# Patient Record
Sex: Female | Born: 1975 | Race: White | Hispanic: No | Marital: Married | Smoking: Never smoker
Health system: Southern US, Community
[De-identification: ages and names within clinical notes are randomized; demographics above are authoritative.]

## PROBLEM LIST (undated history)

## (undated) DIAGNOSIS — E119 Type 2 diabetes mellitus without complications: Secondary | ICD-10-CM

## (undated) DIAGNOSIS — E041 Nontoxic single thyroid nodule: Secondary | ICD-10-CM

## (undated) DIAGNOSIS — F32A Depression, unspecified: Secondary | ICD-10-CM

## (undated) DIAGNOSIS — F419 Anxiety disorder, unspecified: Secondary | ICD-10-CM

## (undated) DIAGNOSIS — C50919 Malignant neoplasm of unspecified site of unspecified female breast: Secondary | ICD-10-CM

## (undated) HISTORY — DX: Type 2 diabetes mellitus without complications: E11.9

## (undated) HISTORY — DX: Malignant neoplasm of unspecified site of unspecified female breast: C50.919

## (undated) HISTORY — PX: TONSILLECTOMY: SUR1361

---

## 2019-08-17 ENCOUNTER — Other Ambulatory Visit (HOSPITAL_COMMUNITY): Payer: Self-pay | Admitting: Family Medicine

## 2019-08-17 ENCOUNTER — Other Ambulatory Visit: Payer: Self-pay

## 2019-08-17 ENCOUNTER — Ambulatory Visit (HOSPITAL_COMMUNITY)
Admission: RE | Admit: 2019-08-17 | Discharge: 2019-08-17 | Disposition: A | Discharge status: Home / Self Care | Payer: Managed Care, Other (non HMO) | Source: Ambulatory Visit | Attending: Family Medicine | Admitting: Family Medicine

## 2019-08-17 DIAGNOSIS — J209 Acute bronchitis, unspecified: Secondary | ICD-10-CM

## 2019-11-27 ENCOUNTER — Other Ambulatory Visit: Payer: Self-pay | Admitting: Unknown Physician Specialty

## 2019-11-27 DIAGNOSIS — R928 Other abnormal and inconclusive findings on diagnostic imaging of breast: Secondary | ICD-10-CM

## 2019-12-14 ENCOUNTER — Other Ambulatory Visit: Payer: Self-pay | Admitting: Unknown Physician Specialty

## 2019-12-14 ENCOUNTER — Ambulatory Visit
Admission: RE | Admit: 2019-12-14 | Discharge: 2019-12-14 | Disposition: A | Discharge status: Home / Self Care | Payer: Managed Care, Other (non HMO) | Source: Ambulatory Visit | Attending: Unknown Physician Specialty | Admitting: Unknown Physician Specialty

## 2019-12-14 ENCOUNTER — Other Ambulatory Visit: Payer: Self-pay

## 2019-12-14 DIAGNOSIS — R921 Mammographic calcification found on diagnostic imaging of breast: Secondary | ICD-10-CM

## 2019-12-14 DIAGNOSIS — R928 Other abnormal and inconclusive findings on diagnostic imaging of breast: Secondary | ICD-10-CM

## 2019-12-28 ENCOUNTER — Ambulatory Visit
Admission: RE | Admit: 2019-12-28 | Discharge: 2019-12-28 | Disposition: A | Discharge status: Home / Self Care | Payer: Managed Care, Other (non HMO) | Source: Ambulatory Visit | Attending: Unknown Physician Specialty | Admitting: Unknown Physician Specialty

## 2019-12-28 ENCOUNTER — Other Ambulatory Visit: Payer: Self-pay | Admitting: Unknown Physician Specialty

## 2019-12-28 ENCOUNTER — Other Ambulatory Visit: Payer: Self-pay

## 2019-12-28 DIAGNOSIS — R921 Mammographic calcification found on diagnostic imaging of breast: Secondary | ICD-10-CM

## 2019-12-28 HISTORY — PX: BREAST BIOPSY: SHX20

## 2020-01-02 ENCOUNTER — Encounter: Payer: Self-pay | Admitting: *Deleted

## 2020-01-02 DIAGNOSIS — D0511 Intraductal carcinoma in situ of right breast: Secondary | ICD-10-CM | POA: Insufficient documentation

## 2020-01-02 NOTE — Progress Notes (Signed)
[place]   Telephone:([phone]) [phone] Fax:([phone]) [place] Note   Patient Care Team: Scherrie Bateman as PCP - General (Family Medicine) Mauro Kaufmann, RN as Oncology Nurse Navigator Rockwell Germany, RN as Oncology Nurse Navigator Truitt Merle, MD as Consulting Physician (Hematology)  Date of Service:  01/03/2020   CHIEF COMPLAINTS/PURPOSE OF CONSULTATION:  Newly Diagnosed right breast DCIS   REFERRING PHYSICIAN:  [place]   Oncology History Overview Note  Cancer Staging Ductal carcinoma in situ (DCIS) of right breast Staging form: Breast, AJCC 8th Edition - Clinical stage from 12/28/2019: Stage 0 (cTis (Pag[place]), cN0, cM0, GX, ER+, PR+, HER2: Not Assessed) - Signed by Truitt Merle, MD on 01/02/2020    Ductal carcinoma in situ (DCIS) of right breast  12/14/2019 Mammogram   IMPRESSION: Suspicious calcifications in the OUTER RETROAREOLAR RIGHT breast spanning a distance of 3.5 cm. Tissue sampling is recommended.   12/28/2019 Cancer Staging   Staging form: Breast, AJCC 8th Edition - Clinical stage from 12/28/2019: Stage 0 (cTis (Paget), cN0, cM0, GX, ER+, PR+, HER2: Not Assessed) - Signed by Truitt Merle, MD on 01/02/2020   12/28/2019 Initial Biopsy   Diagnosis 1. Breast, right, needle core biopsy, lateral, anterior - DUCTAL CARCINOMA IN SITU, HIGH-GRADE WITH FOCAL NECROSIS AND CALCIFICATIONS. SEE NOTE 2. Breast, right, needle core biopsy, 12 o'clock, retroareolar - DUCTAL CARCINOMA IN SITU, HIGH-GRADE WITH FOCAL NECROSIS AND CALCIFICATIONS. SEE NOTE   Diagnosis Note 1. and 2. DCIS measures 0.4 cm in part 1 and 0.2 cm in part 2 in greatest linear dimension. Dr. Tresa Moore reviewed the case and concurs with the diagnosis. A breast prognostic profile (ER, PR) is pending and will be reported in an addendum. [facility] was notified on 01/01/2020.   12/28/2019 Receptors her2   1. PROGNOSTIC  INDICATORS Results: IMMUNOHISTOCHEMICAL AND MORPHOMETRIC ANALYSIS PERFORMED MANUALLY Estrogen Receptor: 95%, POSITIVE, STRONG STAINING INTENSITY Progesterone Receptor: 70%, POSITIVE, STRONG STAINING INTENSITY   01/02/2020 Initial Diagnosis   Ductal carcinoma in situ (DCIS) of right breast      HISTORY OF PRESENTING ILLNESS:  Natalie Cardenas 44 y.o. female is a here because of newly diagnosed right breast DCIS. The patient was referred by breast center. The patient presents to the clinic today by herself.   She underwent her first screening mammogram on 11/06/2019 in [place].  She has no palpable breast lesion, denies any skin change or nipple discharge, no breast or constitutional symptoms.  Her mammogram showed indeterminate calcification in the right breast, so she was referred to [facility] breast center for diagnostic mammogram on December 14, 2019, which showed suspicious calcifications in the outer retroareolar area of right breast, measuring 3.5 cm.  Biopsy was recommended and done on December 28, 2019, which showed high-grade DCIS with necrosis and calcification, ER and PR positive.  Patient was referred to [place] to discuss treatment options.  She is a dental hygienist,married.  She previously had a multiple fertility treatments in her 37s and 5 years ago.  She has 2 biological children which are all adults.  Her menstrual period has been normal and due 2 months ago, which was her last one.  She denies any hot flash or mood swing lately.  She has no significant past medical history, except mild anxiety.  She had episode of shingles infection on her left forehead, a few days after her second Covid vaccine shot in February 2021.  Review of system is negative, except  mild shortness of breath intermittently, no significant relationship with exertion. She denies any chest pain, cough or other symptoms.   GYN HISTORY  Menarchal: 10 LMP: 10/18/2019, was regular before that  Contraceptive: none  HRT: no   G2P2:    MEDICAL HISTORY:  Past Medical History:  Diagnosis Date  . Breast cancer ([place])   . Diabetes mellitus without complication ([place])     SURGICAL HISTORY: History reviewed. No pertinent surgical history.  SOCIAL HISTORY: Social History   Socioeconomic History  . Marital status: Married    Spouse name: Not on file  . Number of children: 4  . Years of education: Not on file  . Highest education level: Not on file  Occupational History  . Not on file  Tobacco Use  . Smoking status: Never Smoker  . Smokeless tobacco: Never Used  Vaping Use  . Vaping Use: Never used  Substance and Sexual Activity  . Alcohol use: Not on file    Comment: social   . Drug use: Never  . Sexual activity: Yes  Other Topics Concern  . Not on file  Social History Narrative  . Not on file   Social Determinants of Health   Financial Resource Strain:   . Difficulty of Paying Living Expenses: Not on file  Food Insecurity:   . Worried About Charity fundraiser in the Last Year: Not on file  . Ran Out of Food in the Last Year: Not on file  Transportation Needs:   . Lack of Transportation (Medical): Not on file  . Lack of Transportation (Non-Medical): Not on file  Physical Activity:   . Days of Exercise per Week: Not on file  . Minutes of Exercise per Session: Not on file  Stress:   . Feeling of Stress : Not on file  Social Connections:   . Frequency of Communication with Friends and Family: Not on file  . Frequency of Social Gatherings with Friends and Family: Not on file  . Attends Religious Services: Not on file  . Active Member of Clubs or Organizations: Not on file  . Attends Archivist Meetings: Not on file  . Marital Status: Not on file  Intimate Partner Violence:   . Fear of Current or Ex-Partner: Not on file  . Emotionally Abused: Not on file  . Physically Abused: Not on file  . Sexually Abused: Not on file    FAMILY HISTORY: Family History  Problem Relation  Age of Onset  . Cancer Father 34       esophageal cancer  . Cancer Paternal Grandfather        esophageal cancer     ALLERGIES:  has No Known Allergies.  MEDICATIONS:  Current Outpatient Medications  Medication Sig Dispense Refill  . diphenhydrAMINE (BENADRYL) 25 MG tablet Take 25 mg by mouth at bedtime as needed.    . Probiotic Product (PROBIOTIC DAILY PO) Take by mouth.    . sennosides-docusate sodium (SENOKOT-S) 8.6-50 MG tablet Take 3 tablets by mouth at bedtime.    Marland Kitchen UNABLE TO FIND daily. Med Name: vitamin b 3    . acarbose (PRECOSE) 25 MG tablet Take by mouth daily.    Marland Kitchen albuterol (VENTOLIN HFA) 108 (90 Base) MCG/ACT inhaler SMARTSIG:1-2 Puff(s) By Mouth Every 4 Hours PRN    . ASMANEX HFA 200 MCG/ACT AERO SMARTSIG:1 Puff(s) Via Inhaler Morning-Evening    . clonazePAM (KLONOPIN) 0.5 MG tablet Take 0.5 mg by mouth 2 (two) times daily as needed.    Marland Kitchen  Prenatal Vit-Fe Fumarate-FA (PRENATAL VITAMIN PO) Take by mouth.     No current facility-administered medications for this visit.    PHYSICAL EXAMINATION: ECOG PERFORMANCE STATUS: 0 - Asymptomatic  Vitals:   01/03/20 1321  BP: 128/90  Pulse: 76  Resp: 18  Temp: (!) 97.3 F (36.3 C)  SpO2: 100%   Filed Weights   01/03/20 1321  Weight: 139 lb 1.6 oz (63.1 kg)    GENERAL:alert, no distress and comfortable SKIN: skin color, texture, turgor are normal, no rashes or significant lesions EYES: normal, Conjunctiva are pink and non-injected, sclera clear NECK: supple, thyroid normal size, non-tender, without nodularity LYMPH:  no palpable lymphadenopathy in the cervical, axillary  LUNGS: clear to auscultation and percussion with normal breathing effort HEART: regular rate & rhythm and no murmurs and no lower extremity edema ABDOMEN:abdomen soft, non-tender and normal bowel sounds Musculoskeletal:no cyanosis of digits and no clubbing  NEURO: alert & oriented x 3 with fluent speech, no focal motor/sensory deficits Breasts:  Breast inspection showed them to be symmetrical with no nipple discharge. (+) Moderate ecchymosis over the biopsy site in the outer upper quadrant of right breast. palpation of the breasts and axilla revealed no obvious mass that I could appreciate except a small mass at 9:00 biopsy site which is likely a hematoma.   LABORATORY DATA:  I have reviewed the data as listed No flowsheet data found.  No flowsheet data found.   RADIOGRAPHIC STUDIES: I have personally reviewed the radiological images as listed and agreed with the findings in the report. MM Digital Diagnostic Unilat R  Result Date: 12/14/2019 CLINICAL DATA:  44 year old female for further evaluation of RIGHT breast calcifications identified on baseline screening mammogram. EXAM: DIGITAL DIAGNOSTIC RIGHT MAMMOGRAM WITH CAD COMPARISON:  Previous exam(s). ACR Breast Density Category c: The breast tissue is heterogeneously dense, which may obscure small masses. FINDINGS: Full field and magnification views of the RIGHT breast demonstrate pleomorphic calcifications within the OUTER RETROAREOLAR RIGHT breast spanning a distance of up to 3.5 cm in the transverse dimension. No associated mass identified. Mammographic images were processed with CAD. IMPRESSION: Suspicious calcifications in the OUTER RETROAREOLAR RIGHT breast spanning a distance of 3.5 cm. Tissue sampling is recommended. RECOMMENDATION: 3D/stereotactic guided RIGHT breast biopsies of RIGHT breast calcifications. Ideally, biopsy of the two farthest extent of calcifications is recommended, but may not be possible due to the superficial position of these calcifications. If these calcifications are positive for malignancy and breast conservation is desired, then MRI of the breast would be recommended. I have discussed the findings and recommendations with the patient. If applicable, a reminder letter will be sent to the patient regarding the next appointment. BI-RADS CATEGORY  4: Suspicious.  Electronically Signed   By: Margarette Canada M.D.   On: 12/14/2019 12:19   MM CLIP PLACEMENT RIGHT  Result Date: 12/28/2019 CLINICAL DATA:  Evaluate post biopsy marker clip placement following stereotactic core needle biopsy of a span of calcifications in the anterior right breast. Two biopsies performed. EXAM: DIAGNOSTIC RIGHT MAMMOGRAM POST STEREOTACTIC BIOPSY COMPARISON:  Previous exam(s). FINDINGS: Mammographic images were obtained following stereotactic guided biopsy of right breast calcifications. The coil shaped biopsy clip lies adjacent to residual calcifications along the anterolateral breast and the X shaped biopsy clip lies in the retroareolar breast in the expected location of the calcifications along this portion of the span of suspicious calcifications. The biopsy clips are separated by 1.8 cm on the LM view and 1 cm on the cc view.  IMPRESSION: Appropriate positioning of the coil and X shaped biopsy marking clips at the site of biopsy in the anterior right breast. Final Assessment: Post Procedure Mammograms for Marker Placement Electronically Signed   By: Lajean Manes M.D.   On: 12/28/2019 12:23   MM RT BREAST BX W LOC DEV 1ST LESION IMAGE BX SPEC STEREO GUIDE  Addendum Date: 01/03/2020   ADDENDUM REPORT: 01/03/2020 09:14 ADDENDUM: Pathology revealed HIGH-GRADE DUCTAL CARCINOMA IN SITU WITH FOCAL NECROSIS AND CALCIFICATIONS of the Right breast, both locations, lateral anterior and retroareolar. This was found to be concordant by Dr. Lajean Manes. Pathology results were discussed with the patient by telephone. The patient reported doing well after the biopsies with tenderness and bleeding at the sites. Post biopsy instructions and care were reviewed and questions were answered. The patient was encouraged to call [facility] for any additional concerns. My direct phone number was provided. Surgical consultation has been arranged with Dr. Rolm Bookbinder, per patient  request, at [facility] on January 11, 2020. Medical Oncology consultation has been arranged with Dr. Truitt Merle at [facility] on January 03, 2020. Consideration for a bilateral breast MRI for further evaluation of extent of disease given the High Grade histology. Pathology results were called and faxed to Dr. Floreen Comber Buist's office on January 01, 2020, ([place]). Pathology results reported by Terie Purser, RN on 01/03/2020. Electronically Signed   By: Lajean Manes M.D.   On: 01/03/2020 09:14   Result Date: 01/03/2020 CLINICAL DATA:  Patient presents for stereotactic core needle biopsy of a 3.7 cm span of calcifications in the right breast, extending from the lateral anterior aspect of the breast to the retroareolar breast. EXAM: RIGHT BREAST STEREOTACTIC CORE NEEDLE BIOPSY: 2 BIOPSIES PERFORMED COMPARISON:  Previous exams. FINDINGS: The patient and I discussed the procedure of stereotactic-guided biopsy including benefits and alternatives. We discussed the high likelihood of a successful procedure. We discussed the risks of the procedure including infection, bleeding, tissue injury, clip migration, and inadequate sampling. Informed written consent was given. The usual time out protocol was performed immediately prior to the procedure. Lesion #1: Calcifications along the anterior, lateral aspect of the breast. Using sterile technique and 1% Lidocaine as local anesthetic, under stereotactic guidance, a 9 gauge vacuum assisted device was used to perform core needle biopsy of calcifications in the anterior, lateral aspect of the right breast using a lateral approach. Specimen radiograph was performed showing multiple calcifications for which biopsy was performed. Specimens with calcifications are identified for pathology. Lesion quadrant: Lower outer quadrant, near 9 o'clock. At the conclusion of the procedure, coil shaped tissue marker clip was deployed into the biopsy  cavity. Lesion #2: 12 o'clock, retroareolar extent of the calcifications. Using sterile technique and 1% Lidocaine as local anesthetic, under stereotactic guidance, a 9 gauge vacuum assisted device was used to perform core needle biopsy of calcifications in the superior retroareolar right breast using a lateral approach. Specimen radiograph was performed showing calcifications for which biopsy was performed. Specimens with calcifications are identified for pathology. Lesion quadrant: Upper outer quadrant, near 12 o'clock, retroareolar. At the conclusion of the procedure, X shaped tissue marker clip was deployed into the biopsy cavity. Follow-up 2-view mammogram was performed and dictated separately. IMPRESSION: Stereotactic-guided biopsy of 2 areas along a 3.7 cm span of right breast calcifications. No apparent complications. Electronically Signed: By: Lajean Manes M.D. On: 12/28/2019 12:18   MM RT BREAST BX  W LOC DEV EA AD LESION IMG BX SPEC STEREO GUIDE  Addendum Date: 01/03/2020   ADDENDUM REPORT: 01/03/2020 09:14 ADDENDUM: Pathology revealed HIGH-GRADE DUCTAL CARCINOMA IN SITU WITH FOCAL NECROSIS AND CALCIFICATIONS of the Right breast, both locations, lateral anterior and retroareolar. This was found to be concordant by Dr. Lajean Manes. Pathology results were discussed with the patient by telephone. The patient reported doing well after the biopsies with tenderness and bleeding at the sites. Post biopsy instructions and care were reviewed and questions were answered. The patient was encouraged to call [facility] for any additional concerns. My direct phone number was provided. Surgical consultation has been arranged with Dr. Rolm Bookbinder, per patient request, at [facility] on January 11, 2020. Medical Oncology consultation has been arranged with Dr. Truitt Merle at [facility] on January 03, 2020. Consideration for a bilateral breast MRI for  further evaluation of extent of disease given the High Grade histology. Pathology results were called and faxed to Dr. Floreen Comber Buist's office on January 01, 2020, ([place]). Pathology results reported by Terie Purser, RN on 01/03/2020. Electronically Signed   By: Lajean Manes M.D.   On: 01/03/2020 09:14   Result Date: 01/03/2020 CLINICAL DATA:  Patient presents for stereotactic core needle biopsy of a 3.7 cm span of calcifications in the right breast, extending from the lateral anterior aspect of the breast to the retroareolar breast. EXAM: RIGHT BREAST STEREOTACTIC CORE NEEDLE BIOPSY: 2 BIOPSIES PERFORMED COMPARISON:  Previous exams. FINDINGS: The patient and I discussed the procedure of stereotactic-guided biopsy including benefits and alternatives. We discussed the high likelihood of a successful procedure. We discussed the risks of the procedure including infection, bleeding, tissue injury, clip migration, and inadequate sampling. Informed written consent was given. The usual time out protocol was performed immediately prior to the procedure. Lesion #1: Calcifications along the anterior, lateral aspect of the breast. Using sterile technique and 1% Lidocaine as local anesthetic, under stereotactic guidance, a 9 gauge vacuum assisted device was used to perform core needle biopsy of calcifications in the anterior, lateral aspect of the right breast using a lateral approach. Specimen radiograph was performed showing multiple calcifications for which biopsy was performed. Specimens with calcifications are identified for pathology. Lesion quadrant: Lower outer quadrant, near 9 o'clock. At the conclusion of the procedure, coil shaped tissue marker clip was deployed into the biopsy cavity. Lesion #2: 12 o'clock, retroareolar extent of the calcifications. Using sterile technique and 1% Lidocaine as local anesthetic, under stereotactic guidance, a 9 gauge vacuum assisted device was used to perform core  needle biopsy of calcifications in the superior retroareolar right breast using a lateral approach. Specimen radiograph was performed showing calcifications for which biopsy was performed. Specimens with calcifications are identified for pathology. Lesion quadrant: Upper outer quadrant, near 12 o'clock, retroareolar. At the conclusion of the procedure, X shaped tissue marker clip was deployed into the biopsy cavity. Follow-up 2-view mammogram was performed and dictated separately. IMPRESSION: Stereotactic-guided biopsy of 2 areas along a 3.7 cm span of right breast calcifications. No apparent complications. Electronically Signed: By: Lajean Manes M.D. On: 12/28/2019 12:18    ASSESSMENT & PLAN:  Natalie Cardenas is a 44 y.o. female with a history of anxiety and shingle infection   1. Right breast DCIS, high grade, ER+PR+  -I discussed her breast imaging and needle biopsy results with patient in great detail. She was found to have high grade DCIS in  her right breast with necrosis and calcifications on screening mammogram.  -She is probably a candidate for breast conservation surgery, although the area of involvement is not very small and I am not sure if she needs mastectomy. She is scheduled to see breast surgeon Dr. Donne Hazel next week. Given her young age and dense breast tissue, will also obtain breast MRI before proceeding with Surgery.  -Her DCIS will be cured by complete surgical resection. Any form of adjuvant therapy is preventive. -I reviewed her risk and treatment benefits using the Breast Cancer Nomogram from [facility]). Based on family, PMx and lifestyle she has a 18% risk of developing future breast cancer in the next 10 years. Her risk would drop to 7-9% with RT or Antiestrogen therapy alone. With both adjuvant treatments her risk would decrease to 3%. She is interested in both preventative treatments.  -She will likely benefit from breast radiation if she undergo  lumpectomy to decrease the risk of local breast cancer. She will be referred to rad/onc if lumpectomy -Given her strongly positive ER and PR and premenopausal status, I do recommend antiestrogen therapy with Tamoxifen for 5 years, which decrease her risk of future breast cancer by ~50%.              -The potential side effects, which includes but not limited to, hot flash, skin and vaginal dryness, slightly increased risk of cardiovascular disease and cataract, small risk of thrombosis and endometrial cancer, were discussed with her in great details. Preventive strategies for thrombosis, such as being physically active, using compression stocks, avoid cigarette smoking, etc., were reviewed with her. I also recommend her to follow-up with her gynecologist once a year, and watch for vaginal spotting or bleeding, as a clinically sign of endometrial cancer, etc. She voiced good understanding, and agrees to proceed. Will start after she completes adjuvant breast radiation.  -We also discussed that biopsy may have sampling limitation, we will review her surgical path, to see if she has any invasive carcinoma components. -We also discussed the breast cancer surveillance after her surgery. She will continue annual screening mammogram, self exams, and a routine office visit with lab and exam with [place]. I discussed the option of additional screening with annual breast MRIs. I also discussed Abbreviated MRIs which have $400 out-of-pocket cost if insurance does not cover standard breast MRI. She is interested. -Labs reviewed, CBC and CMP WNL except BG 110, Cr 1.11. I encouraged her to drink more water. Initial exam benign.  -Given her young age, I will also refer her to genetics.  Depending on the results, prophylactic bilateral mastectomy may be considered.  -F/u after surgery or radiation   Plan -Bilateral breast MRI with and without contrast -urgent genetic referral -She will see Dr. Donne Hazel next week -I will  see her after surgery or radiation. -She is interested in chemoprevention with tamoxifen  Orders Placed This Encounter  Procedures  . MR BREAST BILATERAL W WO CONTRAST INC CAD    Standing Status:   Future    Standing Expiration Date:   01/02/2021    Order Specific Question:   If indicated for the ordered procedure, I authorize the administration of contrast media per Radiology protocol    Answer:   Yes    Order Specific Question:   What is the patient's sedation requirement?    Answer:   Anti-anxiety    Order Specific Question:   Does the patient have a pacemaker or implanted devices?  Answer:   No    Order Specific Question:   Radiology Contrast Protocol - do NOT remove file path    Answer:   [email or web address]    Order Specific Question:   Preferred imaging location?    Answer:   [facility] (table limit-550lbs)  . Ambulatory referral to Genetics    Referral Priority:   Urgent    Referral Type:   Consultation    Referral Reason:   Specialty Services Required    Number of Visits Requested:   1    All questions were answered. The patient knows to call the clinic with any problems, questions or concerns. The total time spent in the appointment was 50 minutes.     Truitt Merle, MD 01/03/2020  I, Joslyn Devon, am acting as scribe for Truitt Merle, MD.   I have reviewed the above documentation for accuracy and completeness, and I agree with the above.

## 2020-01-03 ENCOUNTER — Inpatient Hospital Stay: Payer: Managed Care, Other (non HMO) | Attending: Hematology | Admitting: Hematology

## 2020-01-03 ENCOUNTER — Encounter: Payer: Self-pay | Admitting: Hematology

## 2020-01-03 ENCOUNTER — Encounter: Payer: Self-pay | Admitting: *Deleted

## 2020-01-03 ENCOUNTER — Telehealth: Payer: Self-pay | Admitting: Hematology

## 2020-01-03 ENCOUNTER — Telehealth: Payer: Self-pay | Admitting: *Deleted

## 2020-01-03 ENCOUNTER — Other Ambulatory Visit: Payer: Self-pay

## 2020-01-03 DIAGNOSIS — R0602 Shortness of breath: Secondary | ICD-10-CM | POA: Diagnosis not present

## 2020-01-03 DIAGNOSIS — F419 Anxiety disorder, unspecified: Secondary | ICD-10-CM | POA: Insufficient documentation

## 2020-01-03 DIAGNOSIS — E119 Type 2 diabetes mellitus without complications: Secondary | ICD-10-CM | POA: Diagnosis not present

## 2020-01-03 DIAGNOSIS — Z170 Estrogen receptor positive status [ER+]: Secondary | ICD-10-CM | POA: Insufficient documentation

## 2020-01-03 DIAGNOSIS — Z79899 Other long term (current) drug therapy: Secondary | ICD-10-CM | POA: Insufficient documentation

## 2020-01-03 DIAGNOSIS — D0511 Intraductal carcinoma in situ of right breast: Secondary | ICD-10-CM | POA: Diagnosis not present

## 2020-01-03 NOTE — Telephone Encounter (Signed)
Scheduled appt per 9/9 sch msg - left message for patient with appt date and time

## 2020-01-03 NOTE — Telephone Encounter (Signed)
Called pt to provide navigation resources and contact information. Left vm to return call.

## 2020-01-04 ENCOUNTER — Encounter: Payer: Self-pay | Admitting: *Deleted

## 2020-01-04 ENCOUNTER — Telehealth: Payer: Self-pay | Admitting: Hematology

## 2020-01-04 ENCOUNTER — Telehealth: Payer: Self-pay | Admitting: *Deleted

## 2020-01-04 NOTE — Telephone Encounter (Signed)
Spoke to pt concerning treatment care plan and diagnosis. Provided navigation resources.  Confirmed future appts for MRI, genetics and appt with Dr. Donne Hazel

## 2020-01-04 NOTE — Telephone Encounter (Signed)
No 9/9 los

## 2020-01-07 ENCOUNTER — Other Ambulatory Visit: Payer: Managed Care, Other (non HMO)

## 2020-01-07 ENCOUNTER — Inpatient Hospital Stay: Payer: Managed Care, Other (non HMO)

## 2020-01-07 ENCOUNTER — Other Ambulatory Visit: Payer: Self-pay | Admitting: Hematology

## 2020-01-07 ENCOUNTER — Other Ambulatory Visit: Payer: Self-pay | Admitting: Genetic Counselor

## 2020-01-07 ENCOUNTER — Other Ambulatory Visit: Payer: Self-pay

## 2020-01-07 ENCOUNTER — Encounter: Payer: Managed Care, Other (non HMO) | Admitting: Genetic Counselor

## 2020-01-07 ENCOUNTER — Telehealth: Payer: Self-pay | Admitting: *Deleted

## 2020-01-07 ENCOUNTER — Inpatient Hospital Stay (HOSPITAL_BASED_OUTPATIENT_CLINIC_OR_DEPARTMENT_OTHER): Payer: Managed Care, Other (non HMO) | Admitting: Genetic Counselor

## 2020-01-07 DIAGNOSIS — D0511 Intraductal carcinoma in situ of right breast: Secondary | ICD-10-CM

## 2020-01-07 DIAGNOSIS — Z1379 Encounter for other screening for genetic and chromosomal anomalies: Secondary | ICD-10-CM | POA: Diagnosis not present

## 2020-01-07 DIAGNOSIS — R5383 Other fatigue: Secondary | ICD-10-CM

## 2020-01-07 LAB — CBC WITH DIFFERENTIAL (CANCER CENTER ONLY)
Abs Immature Granulocytes: 0.02 10*3/uL (ref 0.00–0.07)
Basophils Absolute: 0 10*3/uL (ref 0.0–0.1)
Basophils Relative: 0 %
Eosinophils Absolute: 0.2 10*3/uL (ref 0.0–0.5)
Eosinophils Relative: 3 %
HCT: 34.3 % — ABNORMAL LOW (ref 36.0–46.0)
Hemoglobin: 11.1 g/dL — ABNORMAL LOW (ref 12.0–15.0)
Immature Granulocytes: 0 %
Lymphocytes Relative: 26 %
Lymphs Abs: 1.9 10*3/uL (ref 0.7–4.0)
MCH: 31.4 pg (ref 26.0–34.0)
MCHC: 32.4 g/dL (ref 30.0–36.0)
MCV: 96.9 fL (ref 80.0–100.0)
Monocytes Absolute: 0.6 10*3/uL (ref 0.1–1.0)
Monocytes Relative: 8 %
Neutro Abs: 4.4 10*3/uL (ref 1.7–7.7)
Neutrophils Relative %: 63 %
Platelet Count: 222 10*3/uL (ref 150–400)
RBC: 3.54 MIL/uL — ABNORMAL LOW (ref 3.87–5.11)
RDW: 12.7 % (ref 11.5–15.5)
WBC Count: 7.1 10*3/uL (ref 4.0–10.5)
nRBC: 0 % (ref 0.0–0.2)

## 2020-01-07 LAB — CMP (CANCER CENTER ONLY)
ALT: 15 U/L (ref 0–44)
AST: 16 U/L (ref 15–41)
Albumin: 3.7 g/dL (ref 3.5–5.0)
Alkaline Phosphatase: 86 U/L (ref 38–126)
Anion gap: 7 (ref 5–15)
BUN: 15 mg/dL (ref 6–20)
CO2: 27 mmol/L (ref 22–32)
Calcium: 9.2 mg/dL (ref 8.9–10.3)
Chloride: 108 mmol/L (ref 98–111)
Creatinine: 0.73 mg/dL (ref 0.44–1.00)
GFR, Est AFR Am: >60 mL/min (ref >60)
GFR, Estimated: >60 mL/min (ref >60)
Glucose, Bld: 99 mg/dL (ref 70–99)
Potassium: 3.8 mmol/L (ref 3.5–5.1)
Sodium: 142 mmol/L (ref 135–145)
Total Bilirubin: 0.5 mg/dL (ref 0.3–1.2)
Total Protein: 6.6 g/dL (ref 6.5–8.1)

## 2020-01-07 NOTE — Telephone Encounter (Signed)
Spoke to pt confirmed appt with Dr. Donne Hazel on 9/17 arrive at 10am. Discussed work on breast MRI. Working to get r/s d/t open MRI down.  Pt request Friday as that is her off day and she will be in town seeing Dr. Donne Hazel. Informed pt will call with new MRI appt. Received verbal understanding. Denies further needs at this time.

## 2020-01-08 ENCOUNTER — Encounter: Payer: Self-pay | Admitting: *Deleted

## 2020-01-08 ENCOUNTER — Encounter: Payer: Self-pay | Admitting: Genetic Counselor

## 2020-01-08 LAB — IRON AND TIBC
Iron: 88 ug/dL (ref 41–142)
Saturation Ratios: 27 % (ref 21–57)
TIBC: 330 ug/dL (ref 236–444)
UIBC: 242 ug/dL (ref 120–384)

## 2020-01-08 LAB — GENETIC SCREENING ORDER

## 2020-01-08 LAB — FERRITIN: Ferritin: 42 ng/mL (ref 11–307)

## 2020-01-08 NOTE — Progress Notes (Signed)
REFERRING PROVIDER: Malachy Mood, MD [address] [place],  [place] [address]  PRIMARY PROVIDER:  Avis Epley, PA-C  PRIMARY REASON FOR VISIT:  1. Ductal carcinoma in situ (DCIS) of right breast      HISTORY OF PRESENT ILLNESS:   Natalie Cardenas, a 44 y.o. female, was seen for a [facility] cancer genetics consultation at the request of Dr. Mosetta Putt due to a personal and family history of cancer.  Natalie Cardenas presents to clinic today to discuss the possibility of a hereditary predisposition to cancer, genetic testing, and to further clarify her future cancer risks, as well as potential cancer risks for family members.   In September 2021, at the age of 59, Natalie Cardenas was diagnosed with DCIS of the right breast. The treatment plan includes lumpectomy and radiation, depending on the results of her genetic testing.      CANCER HISTORY:  Oncology History Overview Note  Cancer Staging Ductal carcinoma in situ (DCIS) of right breast Staging form: Breast, AJCC 8th Edition - Clinical stage from 12/28/2019: Stage 0 (cTis (Paget), cN0, cM0, GX, ER+, PR+, HER2: Not Assessed) - Signed by Malachy Mood, MD on 01/02/2020    Ductal carcinoma in situ (DCIS) of right breast  12/14/2019 Mammogram   IMPRESSION: Suspicious calcifications in the OUTER RETROAREOLAR RIGHT breast spanning a distance of 3.5 cm. Tissue sampling is recommended.   12/28/2019 Cancer Staging   Staging form: Breast, AJCC 8th Edition - Clinical stage from 12/28/2019: Stage 0 (cTis (Paget), cN0, cM0, GX, ER+, PR+, HER2: Not Assessed) - Signed by Malachy Mood, MD on 01/02/2020   12/28/2019 Initial Biopsy   Diagnosis 1. Breast, right, needle core biopsy, lateral, anterior - DUCTAL CARCINOMA IN SITU, HIGH-GRADE WITH FOCAL NECROSIS AND CALCIFICATIONS. SEE NOTE 2. Breast, right, needle core biopsy, 12 o'clock, retroareolar - DUCTAL CARCINOMA IN SITU, HIGH-GRADE WITH FOCAL NECROSIS AND CALCIFICATIONS. SEE NOTE   Diagnosis Note 1. and 2. DCIS measures  0.4 cm in part 1 and 0.2 cm in part 2 in greatest linear dimension. Dr. Berneice Heinrich reviewed the case and concurs with the diagnosis. A breast prognostic profile (ER, PR) is pending and will be reported in an addendum. [facility] was notified on 01/01/2020.   12/28/2019 Receptors her2   1. PROGNOSTIC INDICATORS Results: IMMUNOHISTOCHEMICAL AND MORPHOMETRIC ANALYSIS PERFORMED MANUALLY Estrogen Receptor: 95%, POSITIVE, STRONG STAINING INTENSITY Progesterone Receptor: 70%, POSITIVE, STRONG STAINING INTENSITY   01/02/2020 Initial Diagnosis   Ductal carcinoma in situ (DCIS) of right breast      RISK FACTORS:  Menarche was at age 12.  First live birth at age 38.  OCP use for approximately 5-10 years.  Ovaries intact: yes.  Hysterectomy: no.  Menopausal status: perimenopausal.  HRT use: 0 years. Colonoscopy: no; not examined. Mammogram within the last year: yes. Number of breast biopsies: 1. Up to date with pelvic exams: yes. Any excessive radiation exposure in the past: no  Past Medical History:  Diagnosis Date  . Breast cancer (HCC)   . Diabetes mellitus without complication (HCC)     No past surgical history on file.  Social History   Socioeconomic History  . Marital status: Married    Spouse name: Not on file  . Number of children: 4  . Years of education: Not on file  . Highest education level: Not on file  Occupational History  . Not on file  Tobacco Use  . Smoking status: Never Smoker  . Smokeless tobacco: Never Used  Vaping Use  . Vaping Use: Never used  Substance and Sexual Activity  . Alcohol use: Not on file    Comment: social   . Drug use: Never  . Sexual activity: Yes  Other Topics Concern  . Not on file  Social History Narrative  . Not on file   Social Determinants of Health   Financial Resource Strain:   . Difficulty of Paying Living Expenses: Not on file  Food Insecurity:   . Worried About Charity fundraiser in the Last  Year: Not on file  . Ran Out of Food in the Last Year: Not on file  Transportation Needs:   . Lack of Transportation (Medical): Not on file  . Lack of Transportation (Non-Medical): Not on file  Physical Activity:   . Days of Exercise per Week: Not on file  . Minutes of Exercise per Session: Not on file  Stress:   . Feeling of Stress : Not on file  Social Connections:   . Frequency of Communication with Friends and Family: Not on file  . Frequency of Social Gatherings with Friends and Family: Not on file  . Attends Religious Services: Not on file  . Active Member of Clubs or Organizations: Not on file  . Attends Archivist Meetings: Not on file  . Marital Status: Not on file     FAMILY HISTORY:  We obtained a detailed, 4-generation family history.  Significant diagnoses are listed below: Family History  Problem Relation Age of Onset  . Cancer Father 5       esophageal cancer  . Cancer Paternal Grandfather        esophageal cancer   . Brain cancer Paternal Grandmother     The patient has two children who are cancer free.  She has one maternal half brother who is cancer free, and two paternal half brothers who are cancer free.  Her father is deceased and her mother is living.  The patient's mother has not had cancer.  She has a brother and two sisters.  One sister died for unknown reasons and the brother died in a house fire.  There is no reported family history of cancer.  The maternal grandparents are deceased.  The patient's father had esophageal cancer due to Barrett's esophagus.  He had a twin brother and two other brothers who are cancer free.  The paternal grandparents are deceased.  The grandfather also had esophageal cancer and the grandmother had brain cancer.  Ms. January is unaware of previous family history of genetic testing for hereditary cancer risks. Patient's maternal ancestors are of [place] descent, and paternal ancestors are of [place] descent. There  is no reported Ashkenazi Jewish ancestry. There is no known consanguinity.    GENETIC COUNSELING ASSESSMENT: Natalie Cardenas is a 44 y.o. female with a personal and family history of cancer which is somewhat suggestive of a hereditary cancer syndrome and predisposition to cancer given Natalie Cardenas age of onset of breast cancer. We, therefore, discussed and recommended the following at today's visit.   DISCUSSION: We discussed that 5 - 10% of breast cancer is hereditary, with most cases associated with BRCA mutations.  There are other genes that can be associated with hereditary breast cancer syndromes.  These include ATM, CHEK2 and PALB2.  We discussed that testing is beneficial for several reasons including knowing how to follow individuals after completing their treatment, and understand if other family members could be at risk for cancer and allow  them to undergo genetic testing.   Natalie Cardenas indicated that her father had to give blood monthly, and they threw the blood away and did not use it.  We discussed that based on her description, he probably had Hemachromatosis.  This is an iron storage disease that affects men more severely than women due to women having lower iron levels due to menstruation.  The treatment for this condition is 'blood letting', and that by not doing this iron will build up causing liver disease including cirrhosis and cancer, as well as other concerns.  Approximately 1 in 10 individuals is a carrier for this condition.  If Natalie Cardenas father had this condition, she is an obligate carrier, as are her paternal brothers.  She could be tested to confirm whether she is affected (in case her mother was a carrier), since this is a relatively common carrier condition.  We reviewed the characteristics, features and inheritance patterns of hereditary cancer syndromes. We also discussed genetic testing, including the appropriate family members to test, the process of testing, insurance coverage and  turn-around-time for results. We discussed the implications of a negative, positive and/or variant of uncertain significant result. In order to get genetic test results in a timely manner so that Natalie Cardenas can use these genetic test results for surgical decisions, we recommended Natalie Cardenas pursue genetic testing for the 9-gene STAT panel. Once complete, we recommend Natalie Cardenas pursue reflex genetic testing to the Multi cancer gene panel. The Multi-Gene Panel offered by [facility] includes sequencing and/or deletion duplication testing of the following 85 genes: AIP, ALK, APC, ATM, AXIN2,BAP1,  BARD1, BLM, BMPR1A, BRCA1, BRCA2, BRIP1, CASR, CDC73, CDH1, CDK4, CDKN1B, CDKN1C, CDKN2A (p14ARF), CDKN2A (p16INK4a), CEBPA, CHEK2, CTNNA1, DICER1, DIS3L2, EGFR (c.2369C>T, p.Thr790Met variant only), EPCAM (Deletion/duplication testing only), FH, FLCN, GATA2, GPC3, GREM1 (Promoter region deletion/duplication testing only), HOXB13 (c.251G>A, p.Gly84Glu), HRAS, KIT, MAX, MEN1, MET, MITF (c.952G>A, p.Glu318Lys variant only), MLH1, MSH2, MSH3, MSH6, MUTYH, NBN, NF1, NF2, NTHL1, PALB2, PDGFRA, PHOX2B, PMS2, POLD1, POLE, POT1, PRKAR1A, PTCH1, PTEN, RAD50, RAD51C, RAD51D, RB1, RECQL4, RET, RNF43, RUNX1, SDHAF2, SDHA (sequence changes only), SDHB, SDHC, SDHD, SMAD4, SMARCA4, SMARCB1, SMARCE1, STK11, SUFU, TERC, TERT, TMEM127, TP53, TSC1, TSC2, VHL, WRN and WT1.   Based on Natalie Cardenas personal and family history of cancer, she meets medical criteria for genetic testing. Despite that she meets criteria, she may still have an out of pocket cost.   PLAN: After considering the risks, benefits, and limitations, Natalie Cardenas provided informed consent to pursue genetic testing and the blood sample was sent to [facility] for analysis of the multi-cancer panel. Results should be available within approximately 2-3 weeks' time, at which point they will be disclosed by telephone to Natalie Cardenas, as will any additional recommendations warranted by these  results. Natalie Cardenas will receive a summary of her genetic counseling visit and a copy of her results once available. This information will also be available in Epic.   Lastly, we encouraged Natalie Cardenas to remain in contact with cancer genetics annually so that we can continuously update the family history and inform her of any changes in cancer genetics and testing that may be of benefit for this family.   Natalie Cardenas were answered to her satisfaction today. Our contact information was provided should additional Cardenas or concerns arise. Thank you for the referral and allowing [place] to share in the care of your patient.   Bryten Maher P. Florene Glen, [place], [facility] Licensed, Insurance risk surveyor [email or web address] phone:  [phone]  The patient was seen for a total of 35 minutes in face-to-face genetic counseling.  This patient was discussed with Drs. Magrinat, Lindi Adie and/or Burr Medico who agrees with the above.    _______________________________________________________________________ For Office Staff:  Number of people involved in session: 1 Was an Intern/ student involved with case: no

## 2020-01-10 ENCOUNTER — Telehealth: Payer: Self-pay

## 2020-01-10 NOTE — Telephone Encounter (Signed)
Did receive call back relayed to pt lab results and new recommendations to continue with prenatal vitamins pt states she will add iron but does experience some constipation so she will take otc iron over the counter with a source of vit c   Encouraged to call [facility] for any questions concerns or changes

## 2020-01-10 NOTE — Telephone Encounter (Signed)
-----   Message from Truitt Merle, MD sent at 01/08/2020  4:32 PM EDT ----- My nurse, please let pt know her lab results, mild anemia, iron level good (her father has hemochromatosis), no other concerns. She can try MVI such as prenatal vitamin for her mild anemia. Thanks   Truitt Merle  01/08/2020

## 2020-01-10 NOTE — Telephone Encounter (Signed)
Pt called message left concerning labs and new recommendations encouraged pt to call for any questions concerns or changes

## 2020-01-11 ENCOUNTER — Ambulatory Visit
Admission: RE | Admit: 2020-01-11 | Discharge: 2020-01-11 | Disposition: A | Discharge status: Home / Self Care | Payer: Managed Care, Other (non HMO) | Source: Ambulatory Visit | Attending: Hematology | Admitting: Hematology

## 2020-01-11 DIAGNOSIS — D0511 Intraductal carcinoma in situ of right breast: Secondary | ICD-10-CM

## 2020-01-11 MED ORDER — GADOBUTROL 1 MMOL/ML IV SOLN
6.0000 mL | Freq: Once | INTRAVENOUS | Status: AC | PRN
  Administered 2020-01-11: 6 mL via INTRAVENOUS

## 2020-01-14 ENCOUNTER — Telehealth: Payer: Self-pay | Admitting: Genetic Counselor

## 2020-01-14 ENCOUNTER — Other Ambulatory Visit: Payer: Self-pay | Admitting: Hematology

## 2020-01-14 ENCOUNTER — Ambulatory Visit: Payer: Self-pay | Admitting: Genetic Counselor

## 2020-01-14 ENCOUNTER — Encounter: Payer: Self-pay | Admitting: Genetic Counselor

## 2020-01-14 ENCOUNTER — Telehealth: Payer: Self-pay | Admitting: *Deleted

## 2020-01-14 DIAGNOSIS — D0511 Intraductal carcinoma in situ of right breast: Secondary | ICD-10-CM

## 2020-01-14 DIAGNOSIS — Z1379 Encounter for other screening for genetic and chromosomal anomalies: Secondary | ICD-10-CM | POA: Insufficient documentation

## 2020-01-14 NOTE — Telephone Encounter (Signed)
Revealed negative genetic testing.  Discussed that we do not know why she has breast cancer or why there is cancer in the family. It could be due to a different gene that we are not testing, or maybe our current technology may not be able to pick something up.  It will be important for her to keep in contact with genetics to keep up with whether additional testing may be needed. 

## 2020-01-14 NOTE — Telephone Encounter (Signed)
Pt return call, discussed MRI results and need for left MR bx. Received verbal understanding. Informed pt she will receive call with appt date and time.

## 2020-01-14 NOTE — Progress Notes (Signed)
HPI:  Ms. Klugh was previously seen in the [employer] clinic due to a personal and family history of cancer and concerns regarding a hereditary predisposition to cancer. Please refer to our prior cancer genetics clinic note for more information regarding our discussion, assessment and recommendations, at the time. Ms. Behringer recent genetic test results were disclosed to her, as were recommendations warranted by these results. These results and recommendations are discussed in more detail below.  CANCER HISTORY:  Oncology History Overview Note  Cancer Staging Ductal carcinoma in situ (DCIS) of right breast Staging form: Breast, AJCC 8th Edition - Clinical stage from 12/28/2019: Stage 0 (cTis (Paget), cN0, cM0, GX, ER+, PR+, HER2: Not Assessed) - Signed by Truitt Merle, MD on 01/02/2020    Ductal carcinoma in situ (DCIS) of right breast  12/14/2019 Mammogram   IMPRESSION: Suspicious calcifications in the OUTER RETROAREOLAR RIGHT breast spanning a distance of 3.5 cm. Tissue sampling is recommended.   12/28/2019 Cancer Staging   Staging form: Breast, AJCC 8th Edition - Clinical stage from 12/28/2019: Stage 0 (cTis (Paget), cN0, cM0, GX, ER+, PR+, HER2: Not Assessed) - Signed by Truitt Merle, MD on 01/02/2020   12/28/2019 Initial Biopsy   Diagnosis 1. Breast, right, needle core biopsy, lateral, anterior - DUCTAL CARCINOMA IN SITU, HIGH-GRADE WITH FOCAL NECROSIS AND CALCIFICATIONS. SEE NOTE 2. Breast, right, needle core biopsy, 12 o'clock, retroareolar - DUCTAL CARCINOMA IN SITU, HIGH-GRADE WITH FOCAL NECROSIS AND CALCIFICATIONS. SEE NOTE   Diagnosis Note 1. and 2. DCIS measures 0.4 cm in part 1 and 0.2 cm in part 2 in greatest linear dimension. Dr. Tresa Moore reviewed the case and concurs with the diagnosis. A breast prognostic profile (ER, PR) is pending and will be reported in an addendum. The [facility] was notified on 01/01/2020.   12/28/2019 Receptors her2   1. PROGNOSTIC  INDICATORS Results: IMMUNOHISTOCHEMICAL AND MORPHOMETRIC ANALYSIS PERFORMED MANUALLY Estrogen Receptor: 95%, POSITIVE, STRONG STAINING INTENSITY Progesterone Receptor: 70%, POSITIVE, STRONG STAINING INTENSITY   01/02/2020 Initial Diagnosis   Ductal carcinoma in situ (DCIS) of right breast   01/14/2020 Genetic Testing   Negative genetic testing.  KIT c.1879C>T (p.Pro627Ser) and PDGFRA c.1436G>A (p.Arg479Gln) VUS identified on the Multi-cancer panel.  The Multi-Gene Panel offered by [facility] includes sequencing and/or deletion duplication testing of the following 85 genes: AIP, ALK, APC, ATM, AXIN2,BAP1,  BARD1, BLM, BMPR1A, BRCA1, BRCA2, BRIP1, CASR, CDC73, CDH1, CDK4, CDKN1B, CDKN1C, CDKN2A (p14ARF), CDKN2A (p16INK4a), CEBPA, CHEK2, CTNNA1, DICER1, DIS3L2, EGFR (c.2369C>T, p.Thr790Met variant only), EPCAM (Deletion/duplication testing only), FH, FLCN, GATA2, GPC3, GREM1 (Promoter region deletion/duplication testing only), HOXB13 (c.251G>A, p.Gly84Glu), HRAS, KIT, MAX, MEN1, MET, MITF (c.952G>A, p.Glu318Lys variant only), MLH1, MSH2, MSH3, MSH6, MUTYH, NBN, NF1, NF2, NTHL1, PALB2, PDGFRA, PHOX2B, PMS2, POLD1, POLE, POT1, PRKAR1A, PTCH1, PTEN, RAD50, RAD51C, RAD51D, RB1, RECQL4, RET, RNF43, RUNX1, SDHAF2, SDHA (sequence changes only), SDHB, SDHC, SDHD, SMAD4, SMARCA4, SMARCB1, SMARCE1, STK11, SUFU, TERC, TERT, TMEM127, TP53, TSC1, TSC2, VHL, WRN and WT1.  The report date is January 14, 2020.     FAMILY HISTORY:  We obtained a detailed, 4-generation family history.  Significant diagnoses are listed below: Family History  Problem Relation Age of Onset  . Cancer Father 73       esophageal cancer  . Cancer Paternal Grandfather        esophageal cancer   . Brain cancer Paternal Grandmother     The patient has two children who are cancer free.  She has one maternal half  brother who is cancer free, and two paternal half brothers who are cancer free.  Her father is deceased and her mother is  living.  The patient's mother has not had cancer.  She has a brother and two sisters.  One sister died for unknown reasons and the brother died in a house fire.  There is no reported family history of cancer.  The maternal grandparents are deceased.  The patient's father had esophageal cancer due to Barrett's esophagus.  He had a twin brother and two other brothers who are cancer free.  The paternal grandparents are deceased.  The grandfather also had esophageal cancer and the grandmother had brain cancer.  Ms. Pickler is unaware of previous family history of genetic testing for hereditary cancer risks. Patient's maternal ancestors are of [place] descent, and paternal ancestors are of [place] descent. There is no reported Ashkenazi Jewish ancestry. There is no known consanguinity.     GENETIC TEST RESULTS: Genetic testing reported out on January 14, 2020 through the multi-cancer panel found no pathogenic mutations. The Multi-Gene Panel offered by [facility] includes sequencing and/or deletion duplication testing of the following 85 genes: AIP, ALK, APC, ATM, AXIN2,BAP1,  BARD1, BLM, BMPR1A, BRCA1, BRCA2, BRIP1, CASR, CDC73, CDH1, CDK4, CDKN1B, CDKN1C, CDKN2A (p14ARF), CDKN2A (p16INK4a), CEBPA, CHEK2, CTNNA1, DICER1, DIS3L2, EGFR (c.2369C>T, p.Thr790Met variant only), EPCAM (Deletion/duplication testing only), FH, FLCN, GATA2, GPC3, GREM1 (Promoter region deletion/duplication testing only), HOXB13 (c.251G>A, p.Gly84Glu), HRAS, KIT, MAX, MEN1, MET, MITF (c.952G>A, p.Glu318Lys variant only), MLH1, MSH2, MSH3, MSH6, MUTYH, NBN, NF1, NF2, NTHL1, PALB2, PDGFRA, PHOX2B, PMS2, POLD1, POLE, POT1, PRKAR1A, PTCH1, PTEN, RAD50, RAD51C, RAD51D, RB1, RECQL4, RET, RNF43, RUNX1, SDHAF2, SDHA (sequence changes only), SDHB, SDHC, SDHD, SMAD4, SMARCA4, SMARCB1, SMARCE1, STK11, SUFU, TERC, TERT, TMEM127, TP53, TSC1, TSC2, VHL, WRN and WT1. The test report has been scanned into EPIC and is located under the Molecular  Pathology section of the Results Review tab.  A portion of the result report is included below for reference.     We discussed with Ms. Oler that because current genetic testing is not perfect, it is possible there may be a gene mutation in one of these genes that current testing cannot detect, but that chance is small.  We also discussed, that there could be another gene that has not yet been discovered, or that we have not yet tested, that is responsible for the cancer diagnoses in the family. It is also possible there is a hereditary cause for the cancer in the family that Ms. Asa did not inherit and therefore was not identified in her testing.  Therefore, it is important to remain in touch with cancer genetics in the future so that we can continue to offer Ms. Bartnick the most up to date genetic testing.   Genetic testing did identify two Variants of uncertain significance (VUS) - one in the KI gene called c.1879C>T and a second in the PDGFRA gene called c.1436G>A.  At this time, it is unknown if these variants are associated with increased cancer risk or if they are normal findings, but most variants such as these get reclassified to being inconsequential. They should not be used to make medical management decisions. With time, we suspect the lab will determine the significance of these variants, if any. If we do learn more about them, we will try to contact Ms. Riggio to discuss it further. However, it is important to stay in touch with [place] periodically and keep the address and phone number up  to date.  ADDITIONAL GENETIC TESTING: We discussed with Ms. Lawrance that there are other genes that are associated with increased cancer risk that can be analyzed. Should Ms. Ruffini wish to pursue additional genetic testing, we are happy to discuss and coordinate this testing, at any time.    CANCER SCREENING RECOMMENDATIONS: Ms. Carbonell test result is considered negative (normal).  This means that we have not identified a  hereditary cause for her personal and family history of cancer at this time. Most cancers happen by chance and this negative test suggests that her cancer may fall into this category.    While reassuring, this does not definitively rule out a hereditary predisposition to cancer. It is still possible that there could be genetic mutations that are undetectable by current technology. There could be genetic mutations in genes that have not been tested or identified to increase cancer risk.  Therefore, it is recommended she continue to follow the cancer management and screening guidelines provided by her oncology and primary healthcare provider.   An individual's cancer risk and medical management are not determined by genetic test results alone. Overall cancer risk assessment incorporates additional factors, including personal medical history, family history, and any available genetic information that may result in a personalized plan for cancer prevention and surveillance  RECOMMENDATIONS FOR FAMILY MEMBERS:  Individuals in this family might be at some increased risk of developing cancer, over the general population risk, simply due to the family history of cancer.  We recommended women in this family have a yearly mammogram beginning at age 82, or 32 years younger than the earliest onset of cancer, an annual clinical breast exam, and perform monthly breast self-exams. Women in this family should also have a gynecological exam as recommended by their primary provider. All family members should be referred for colonoscopy starting at age 52.  FOLLOW-UP: Lastly, we discussed with Ms. Erkkila that cancer genetics is a rapidly advancing field and it is possible that new genetic tests will be appropriate for her and/or her family members in the future. We encouraged her to remain in contact with cancer genetics on an annual basis so we can update her personal and family histories and let her know of advances in cancer  genetics that may benefit this family.   Our contact number was provided. Ms. Morace questions were answered to her satisfaction, and she knows she is welcome to call us at anytime with additional questions or concerns.   Roma Kayser, [place], [facility] Licensed, Certified Genetic Counselor [email or web address]

## 2020-01-14 NOTE — Telephone Encounter (Signed)
LM on VM that results are back and to please call. 

## 2020-01-14 NOTE — Telephone Encounter (Signed)
Confirmed MR bx on 10/8. Denies further questions at this time. Encourage pt to call with needs. Received verbal understanding.

## 2020-01-14 NOTE — Telephone Encounter (Signed)
Left vm regarding MRI results. Request return call, contact information provided.

## 2020-01-21 ENCOUNTER — Encounter: Payer: Self-pay | Admitting: *Deleted

## 2020-01-29 ENCOUNTER — Telehealth: Payer: Self-pay | Admitting: *Deleted

## 2020-01-29 NOTE — Telephone Encounter (Signed)
Left vm providing MRI bx date and time of 02/01/20 at 8:30am at [facility]. Provided contact information for questions or needs.

## 2020-02-01 ENCOUNTER — Other Ambulatory Visit: Payer: Self-pay

## 2020-02-01 ENCOUNTER — Other Ambulatory Visit: Payer: Self-pay | Admitting: Radiology

## 2020-02-01 ENCOUNTER — Ambulatory Visit
Admission: RE | Admit: 2020-02-01 | Discharge: 2020-02-01 | Disposition: A | Discharge status: Home / Self Care | Payer: Managed Care, Other (non HMO) | Source: Ambulatory Visit | Attending: Hematology | Admitting: Hematology

## 2020-02-01 DIAGNOSIS — D0511 Intraductal carcinoma in situ of right breast: Secondary | ICD-10-CM

## 2020-02-01 HISTORY — PX: BREAST BIOPSY: SHX20

## 2020-02-01 MED ORDER — GADOBUTROL 1 MMOL/ML IV SOLN
6.0000 mL | Freq: Once | INTRAVENOUS | Status: AC | PRN
  Administered 2020-02-01: 6 mL via INTRAVENOUS

## 2020-02-04 ENCOUNTER — Encounter: Payer: Self-pay | Admitting: *Deleted

## 2020-02-13 ENCOUNTER — Telehealth: Payer: Self-pay | Admitting: *Deleted

## 2020-02-13 NOTE — Telephone Encounter (Signed)
Left vm with plastic surgeons used for reconstruction/oncoplastic surgery. Contact information provided for further questions.

## 2020-02-15 ENCOUNTER — Encounter: Payer: Self-pay | Admitting: *Deleted

## 2020-02-18 ENCOUNTER — Ambulatory Visit: Payer: Managed Care, Other (non HMO) | Admitting: Plastic Surgery

## 2020-02-18 ENCOUNTER — Encounter: Payer: Self-pay | Admitting: Plastic Surgery

## 2020-02-18 ENCOUNTER — Other Ambulatory Visit: Payer: Self-pay

## 2020-02-18 VITALS — BP 117/75 | HR 75 | Temp 97.6°F | Ht 65.0 in | Wt 140.4 lb

## 2020-02-18 DIAGNOSIS — D0511 Intraductal carcinoma in situ of right breast: Secondary | ICD-10-CM

## 2020-02-18 NOTE — Progress Notes (Signed)
Referring Provider Jake Samples, PA-C [address] [place],  [place] [address]   CC:  Chief Complaint  Patient presents with  . Consult      Natalie Cardenas is an 44 y.o. female.  HPI: Patient presents to discuss breast reconstruction.  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  2 separate biopsies have shown DCIS and they are separated by greater than 2 cm it looks like.  She is deciding between a lumpectomy and mastectomy.  She has done some research in reconstruction and is looking for a second opinion in that regard.  She is interested in having slightly larger lifted breast but is trying to gauge the risk benefit of more aggressive surgical treatment.  She has not had any previous breast procedures or biopsies.  She says she is a borderline diabetic.  No Known Allergies  Outpatient Encounter Medications as of 02/18/2020  Medication Sig  . albuterol (VENTOLIN HFA) 108 (90 Base) MCG/ACT inhaler SMARTSIG:1-2 Puff(s) By Mouth Every 4 Hours PRN  . ASMANEX HFA 200 MCG/ACT AERO SMARTSIG:1 Puff(s) Via Inhaler Morning-Evening  . clonazePAM (KLONOPIN) 0.5 MG tablet Take 0.5 mg by mouth 2 (two) times daily as needed.  . diphenhydrAMINE (BENADRYL) 25 MG tablet Take 25 mg by mouth at bedtime as needed.  . Prenatal Vit-Fe Fumarate-FA (PRENATAL VITAMIN PO) Take by mouth.  . Probiotic Product (PROBIOTIC DAILY PO) Take by mouth.  . sennosides-docusate sodium (SENOKOT-S) 8.6-50 MG tablet Take 3 tablets by mouth at bedtime.  . tamoxifen (NOLVADEX) 10 MG tablet   . UNABLE TO FIND daily. Med Name: vitamin b 3  . acarbose (PRECOSE) 25 MG tablet Take by mouth daily. (Patient not taking: Reported on 02/18/2020)  . ferrous sulfate 325 (65 FE) MG tablet Take by mouth.   No facility-administered encounter medications on file as of 02/18/2020.     Past Medical History:  Diagnosis Date  . Breast cancer ([place])   . Diabetes mellitus without complication ([employer])      No past surgical history on file.  Family History  Problem Relation Age of Onset  . Cancer Father 72       esophageal cancer  . Cancer Paternal Grandfather        esophageal cancer   . Brain cancer Paternal Grandmother     Social History   Social History Narrative  . Not on file  Denies current tobacco use  Review of Systems General: Denies fevers, chills, weight loss CV: Denies chest pain, shortness of breath, palpitations  Physical Exam Vitals with BMI 02/18/2020 01/03/2020  Height 5\' 5"  5\' 5"   Weight 140 lbs 6 oz 139 lbs 2 oz  BMI 46.65 99.35  Systolic 701 779  Diastolic 75 90  Pulse 75 76    General:  No acute distress,  Alert and oriented, Non-Toxic, Normal speech and affect Breast: She has grade 2 ptosis.  She is probably around a small C cup.  I do not see any obvious scars.  Base width is about 11 cm.  Assessment/Plan I had a long discussion with the patient about her options.  We first discussed the potential option should she undergo a right lumpectomy followed by radiation therapy.  I would expect that this would shrink and lift the right breast a bit and she might be okay with simply a contralateral mastopexy depending on the size that she wanted to be.  If she did ultimately want to have the  mastopexy on the radiated side I explained that there would be some risk of wound healing complications but it could be done.  If she also wanted an implant on that side that could also be done but in my opinion would need to be done in a delayed fashion.  We also discussed the potential for fat grafting of the lumpectomy defect for subtle contour irregularities.  We also went down the line of discussing reconstruction should she undergo a mastectomy.  She is understandably a bit hesitant to pursue such aggressive surgical treatment for DCIS.  However she does value symmetry in my opinion would achieve the best symmetry with a bilateral mastectomy that would hopefully avoid  radiation.  I went down the explanation for how this would occur with tissue expanders placed at the time of the mastectomy followed by ultimate change to implants 2 to 3 months later.  I briefly touched on autologous reconstruction including latissimus and TRAM flaps but she is not interested in pursuing these.  I explained that after mastectomy with implant-based reconstruction she would need drains and I would more than likely put the expander in the prepectoral plane.  She still has quite a bit of thinking to do in terms of deciding how she wants to proceed and she has a meeting set up with Dr. Donne Hazel later this week.  I am more than happy to see her again if she wants to go through the options 1 more time.  Natalie Cardenas 02/18/2020, 6:17 PM

## 2020-02-21 ENCOUNTER — Telehealth: Payer: Self-pay | Admitting: *Deleted

## 2020-02-21 NOTE — Telephone Encounter (Signed)
Spoke to pt regarding surgery decision. Pt will further discuss with Dr. Donne Hazel on 02/22/20. Pt debating on bilateral mastectomy vs lumpectomy and radiation.

## 2020-02-26 ENCOUNTER — Telehealth: Payer: Self-pay | Admitting: *Deleted

## 2020-02-26 ENCOUNTER — Encounter: Payer: Self-pay | Admitting: *Deleted

## 2020-02-26 ENCOUNTER — Other Ambulatory Visit: Payer: Self-pay | Admitting: General Surgery

## 2020-02-26 DIAGNOSIS — D0511 Intraductal carcinoma in situ of right breast: Secondary | ICD-10-CM

## 2020-02-26 NOTE — Telephone Encounter (Signed)
Spoke to pt regarding sx decision. Pt has decided on bilateral NSM. Dr. Donne Hazel notified.

## 2020-03-06 ENCOUNTER — Encounter: Payer: Self-pay | Admitting: *Deleted

## 2020-03-11 ENCOUNTER — Encounter: Payer: Self-pay | Admitting: *Deleted

## 2020-03-11 ENCOUNTER — Telehealth: Payer: Self-pay | Admitting: Hematology

## 2020-03-11 ENCOUNTER — Telehealth: Payer: Self-pay | Admitting: *Deleted

## 2020-03-11 NOTE — Telephone Encounter (Signed)
Pt called requesting post op with Dr. Burr Medico be scheduled on same day as Dr. Claudia Desanctis as she lives 1 hr away. Scheduled new appt at 1040 on 1/12 to see Dr. Burr Medico. Left detailed vm with new appt date and time

## 2020-03-11 NOTE — Telephone Encounter (Signed)
Scheduled appt per 11/11 sch msg - left message for patient with appt date and time

## 2020-03-26 ENCOUNTER — Encounter: Payer: Managed Care, Other (non HMO) | Admitting: Plastic Surgery

## 2020-03-31 ENCOUNTER — Telehealth: Payer: Self-pay

## 2020-03-31 ENCOUNTER — Other Ambulatory Visit: Payer: Self-pay | Admitting: Hematology

## 2020-03-31 MED ORDER — CLONAZEPAM 0.5 MG PO TABS
0.5000 mg | ORAL_TABLET | Freq: Two times a day (BID) | ORAL | 0 refills | Status: DC | PRN
Start: 2020-03-31 — End: 2020-05-09

## 2020-03-31 NOTE — Telephone Encounter (Signed)
Natalie Meisinger called today stating the the Tamoxifen made her feel "fuzzy" in the head, it effects her memory, and she has become agitated while taking it.  She states she stopped for a couple of days and these symptoms went away.  She also states she is having increased anxiety the closer she gets to surgery. She is requesting to stop tamoxifen and a refill for klonopin.  I reviewed with Dr Burr Medico she states Natalie Cardenas can stop tamoxifen now.  She has sent a prescription for the klonopin. Natalie Ovens verbalized understanding.

## 2020-03-31 NOTE — Progress Notes (Signed)
ICD-10-CM   1. Ductal carcinoma in situ (DCIS) of right breast  D05.11       Patient ID: Natalie Cardenas, female    DOB: 1976/01/17, 44 y.o.   MRN: [id number]   History of Present Illness: Natalie Cardenas is a 44 y.o.  female  with a history of right breast DCIS.  She presents for preoperative evaluation for upcoming procedure, bilateral nipple sparing mastectomies with right axillary sentinel lymph node biopsy with Dr. Donne Hazel and placement of tissue expanders and Flex HD with Dr. Claudia Desanctis, scheduled for 04/29/2020 with Dr. Claudia Desanctis.  Summary from previous visit: Patient has been diagnosed with DCIS in the retroareolar area of the right side.  2 separate biopsies have shown DCIS and they are separated by greater than 2 cm.  She has grade 2 ptosis.  She has approximately a small C cup.  Base width is approximately 11 cm.  No obvious scars.  Job: Copywriter, advertising  PMH Significant for: Diabetes, asthma-seasonal allergy triggered.  The patient has not had had anesthesia previously.  So unsure how she will do.  Past Medical History: Allergies: No Known Allergies  Current Medications:  Current Outpatient Medications:  .  acarbose (PRECOSE) 25 MG tablet, Take by mouth daily. , Disp: , Rfl:  .  albuterol (VENTOLIN HFA) 108 (90 Base) MCG/ACT inhaler, SMARTSIG:1-2 Puff(s) By Mouth Every 4 Hours PRN, Disp: , Rfl:  .  ASMANEX HFA 200 MCG/ACT AERO, SMARTSIG:1 Puff(s) Via Inhaler Morning-Evening, Disp: , Rfl:  .  clonazePAM (KLONOPIN) 0.5 MG tablet, Take 1 tablet (0.5 mg total) by mouth 2 (two) times daily as needed., Disp: 45 tablet, Rfl: 0 .  diphenhydrAMINE (BENADRYL) 25 MG tablet, Take 25 mg by mouth at bedtime as needed., Disp: , Rfl:  .  ferrous sulfate 325 (65 FE) MG tablet, Take by mouth., Disp: , Rfl:  .  Prenatal Vit-Fe Fumarate-FA (PRENATAL VITAMIN PO), Take by mouth., Disp: , Rfl:  .  Probiotic Product (PROBIOTIC DAILY PO), Take by mouth., Disp: , Rfl:  .  sennosides-docusate sodium (SENOKOT-S)  8.6-50 MG tablet, Take 3 tablets by mouth at bedtime., Disp: , Rfl:  .  UNABLE TO FIND, daily. Med Name: vitamin b 3, Disp: , Rfl:  .  tamoxifen (NOLVADEX) 10 MG tablet, , Disp: , Rfl:   Past Medical Problems: Past Medical History:  Diagnosis Date  . Breast cancer ([place])   . Diabetes mellitus without complication [facility])     Past Surgical History: History reviewed. No pertinent surgical history.  Social History: Social History   Socioeconomic History  . Marital status: Married    Spouse name: Not on file  . Number of children: 4  . Years of education: Not on file  . Highest education level: Not on file  Occupational History  . Not on file  Tobacco Use  . Smoking status: Never Smoker  . Smokeless tobacco: Never Used  Vaping Use  . Vaping Use: Never used  Substance and Sexual Activity  . Alcohol use: Not on file    Comment: social   . Drug use: Never  . Sexual activity: Yes  Other Topics Concern  . Not on file  Social History Narrative  . Not on file   Social Determinants of Health   Financial Resource Strain:   . Difficulty of Paying Living Expenses: Not on file  Food Insecurity:   . Worried About Charity fundraiser in the Last Year: Not on file  . Ran Out  of Food in the Last Year: Not on file  Transportation Needs:   . Lack of Transportation (Medical): Not on file  . Lack of Transportation (Non-Medical): Not on file  Physical Activity:   . Days of Exercise per Week: Not on file  . Minutes of Exercise per Session: Not on file  Stress:   . Feeling of Stress : Not on file  Social Connections:   . Frequency of Communication with Friends and Family: Not on file  . Frequency of Social Gatherings with Friends and Family: Not on file  . Attends Religious Services: Not on file  . Active Member of Clubs or Organizations: Not on file  . Attends Archivist Meetings: Not on file  . Marital Status: Not on file  Intimate Partner Violence:   . Fear of Current or  Ex-Partner: Not on file  . Emotionally Abused: Not on file  . Physically Abused: Not on file  . Sexually Abused: Not on file    Family History: Family History  Problem Relation Age of Onset  . Cancer Father 48       esophageal cancer  . Cancer Paternal Grandfather        esophageal cancer   . Brain cancer Paternal Grandmother     Review of Systems: Review of Systems  Constitutional: Negative for chills and fever.  HENT: Negative for congestion and sore throat.   Respiratory: Negative for cough and shortness of breath.   Cardiovascular: Negative for chest pain and palpitations.  Gastrointestinal: Negative for abdominal pain, nausea and vomiting.  Skin: Negative for itching and rash.    Physical Exam: Vital Signs BP 117/66 (BP Location: Left Arm, Patient Position: Sitting, Cuff Size: Normal)   Pulse 84   Temp 97.8 F (36.6 C)   Ht 5\' 5"  (1.651 m)   Wt 142 lb (64.4 kg)   SpO2 99%   BMI 23.63 kg/m  Physical Exam Vitals and nursing note reviewed.  Constitutional:      General: She is not in acute distress.    Appearance: Normal appearance. She is normal weight. She is not ill-appearing.  HENT:     Head: Normocephalic and atraumatic.  Eyes:     Extraocular Movements: Extraocular movements intact.  Cardiovascular:     Rate and Rhythm: Normal rate and regular rhythm.     Pulses: Normal pulses.     Heart sounds: Normal heart sounds.  Pulmonary:     Effort: Pulmonary effort is normal.     Breath sounds: Normal breath sounds. No wheezing, rhonchi or rales.  Abdominal:     General: Bowel sounds are normal.     Palpations: Abdomen is soft.  Musculoskeletal:        General: No swelling. Normal range of motion.     Cervical back: Normal range of motion.  Skin:    General: Skin is warm and dry.     Coloration: Skin is not pale.     Findings: No erythema or rash.  Neurological:     General: No focal deficit present.     Mental Status: She is alert and oriented to  person, place, and time.  Psychiatric:        Mood and Affect: Mood normal.        Behavior: Behavior normal.        Thought Content: Thought content normal.        Judgment: Judgment normal.     Assessment/Plan:  Natalie Cardenas scheduled for bilateral  nipple sparing mastectomies with right axillary sentinel lymph node biopsy with Dr. Donne Hazel and placement of tissue expanders with Flex HD with Dr. Claudia Desanctis.  Risks, benefits, and alternatives of procedure discussed, questions answered and consent obtained.    Patient stopped taking tamoxifen yesterday because it was not working well for her.  Oncology will look into other alternatives.  She has no plans to restart prior to surgery.  Smoking Status: Non-smoker; Counseling Given?  N/A Last Mammogram: MR 01/11/2020; Results: Right breast DCIS.  Left breast indeterminate non-mass enhancement  Caprini Score: High; Risk Factors include: 44 year old female, breast cancer, BMI < 25, and length of planned surgery. Recommendation for mechanical and pharmacological prophylaxis during surgery. Encourage early ambulation.   Post-op Rx sent to pharmacy: Norco, Zofran, Bactrim  Patient was provided with the Tissue Expander risks and General Surgical Risk consent document and Pain Medication Agreement prior to their appointment.  They had adequate time to read through the risk consent documents and Pain Medication Agreement. We also discussed them in person together during this preop appointment. All of their questions were answered to their satisfaction.  Recommended calling if they have any further questions.  Risk consent form and Pain Medication Agreement to be scanned into patient's chart.  The risks that can be encountered with and after placement of a breast expander placement were discussed and include the following but not limited to these: bleeding, infection, delayed healing, anesthesia risks, skin sensation changes, injury to structures including nerves,  blood vessels, and muscles which may be temporary or permanent, allergies to tape, suture materials and glues, blood products, topical preparations or injected agents, skin contour irregularities, skin discoloration and swelling, deep vein thrombosis, cardiac and pulmonary complications, pain, which may persist, fluid accumulation, wrinkling of the skin over the expander, changes in nipple or breast sensation, expander leakage or rupture, faulty position of the expander, persistent pain, formation of tight scar tissue around the expander (capsular contracture), possible need for revisional surgery or staged procedures.  Electronically signed by: Threasa Heads, PA-C 04/02/2020 1:00 PM

## 2020-04-02 ENCOUNTER — Ambulatory Visit (INDEPENDENT_AMBULATORY_CARE_PROVIDER_SITE_OTHER): Payer: Managed Care, Other (non HMO) | Admitting: Plastic Surgery

## 2020-04-02 ENCOUNTER — Other Ambulatory Visit: Payer: Self-pay

## 2020-04-02 ENCOUNTER — Encounter: Payer: Self-pay | Admitting: Plastic Surgery

## 2020-04-02 VITALS — BP 117/66 | HR 84 | Temp 97.8°F | Ht 65.0 in | Wt 142.0 lb

## 2020-04-02 DIAGNOSIS — D0511 Intraductal carcinoma in situ of right breast: Secondary | ICD-10-CM

## 2020-04-02 MED ORDER — HYDROCODONE-ACETAMINOPHEN 5-325 MG PO TABS: 1.0000 | ORAL_TABLET | Freq: Three times a day (TID) | ORAL | 0 refills | Status: AC | PRN

## 2020-04-02 MED ORDER — ONDANSETRON HCL 4 MG PO TABS: 4.0000 mg | ORAL_TABLET | Freq: Three times a day (TID) | ORAL | 0 refills | Status: DC | PRN

## 2020-04-02 MED ORDER — SULFAMETHOXAZOLE-TRIMETHOPRIM 800-160 MG PO TABS: 1.0000 | ORAL_TABLET | Freq: Two times a day (BID) | ORAL | 0 refills | Status: AC

## 2020-04-10 ENCOUNTER — Encounter: Payer: Managed Care, Other (non HMO) | Admitting: Plastic Surgery

## 2020-04-10 ENCOUNTER — Other Ambulatory Visit (HOSPITAL_COMMUNITY): Payer: Managed Care, Other (non HMO)

## 2020-04-14 ENCOUNTER — Ambulatory Visit (HOSPITAL_COMMUNITY): Payer: Managed Care, Other (non HMO)

## 2020-04-17 ENCOUNTER — Encounter: Payer: Managed Care, Other (non HMO) | Admitting: Plastic Surgery

## 2020-04-21 ENCOUNTER — Encounter (HOSPITAL_BASED_OUTPATIENT_CLINIC_OR_DEPARTMENT_OTHER): Payer: Self-pay | Admitting: General Surgery

## 2020-04-21 ENCOUNTER — Other Ambulatory Visit: Payer: Self-pay

## 2020-04-24 ENCOUNTER — Encounter (HOSPITAL_BASED_OUTPATIENT_CLINIC_OR_DEPARTMENT_OTHER)
Admission: RE | Admit: 2020-04-24 | Discharge: 2020-04-24 | Disposition: A | Discharge status: Home / Self Care | Payer: Managed Care, Other (non HMO) | Source: Ambulatory Visit | Attending: General Surgery | Admitting: General Surgery

## 2020-04-24 DIAGNOSIS — Z01812 Encounter for preprocedural laboratory examination: Secondary | ICD-10-CM | POA: Insufficient documentation

## 2020-04-24 LAB — BASIC METABOLIC PANEL
Anion gap: 9 (ref 5–15)
BUN: 14 mg/dL (ref 6–20)
CO2: 25 mmol/L (ref 22–32)
Calcium: 9.3 mg/dL (ref 8.9–10.3)
Chloride: 105 mmol/L (ref 98–111)
Creatinine, Ser: 0.59 mg/dL (ref 0.44–1.00)
GFR, Estimated: >60 mL/min (ref >60)
Glucose, Bld: 102 mg/dL — ABNORMAL HIGH (ref 70–99)
Potassium: 4.2 mmol/L (ref 3.5–5.1)
Sodium: 139 mmol/L (ref 135–145)

## 2020-04-24 MED ORDER — ENSURE PRE-SURGERY PO LIQD: 296.0000 mL | Freq: Once | ORAL | Status: DC

## 2020-04-24 NOTE — Progress Notes (Signed)

## 2020-04-25 ENCOUNTER — Other Ambulatory Visit (HOSPITAL_COMMUNITY)
Admission: RE | Admit: 2020-04-25 | Discharge: 2020-04-25 | Disposition: A | Discharge status: Home / Self Care | Payer: Managed Care, Other (non HMO) | Source: Ambulatory Visit | Attending: General Surgery | Admitting: General Surgery

## 2020-04-25 DIAGNOSIS — Z20822 Contact with and (suspected) exposure to covid-19: Secondary | ICD-10-CM | POA: Diagnosis not present

## 2020-04-25 DIAGNOSIS — Z01812 Encounter for preprocedural laboratory examination: Secondary | ICD-10-CM | POA: Diagnosis not present

## 2020-04-25 LAB — SARS CORONAVIRUS 2 (TAT 6-24 HRS): SARS Coronavirus 2: NEGATIVE

## 2020-04-28 NOTE — Anesthesia Preprocedure Evaluation (Addendum)
Anesthesia Evaluation  Patient identified by MRN, date of birth, ID band Patient awake    Reviewed: Allergy & Precautions, NPO status , Patient's Chart, lab work & pertinent test results  Airway Mallampati: II  TM Distance: >3 FB Neck ROM: Full    Dental no notable dental hx. (+) Teeth Intact, Dental Advisory Given   Pulmonary asthma ,    Pulmonary exam normal breath sounds clear to auscultation       Cardiovascular Exercise Tolerance: Good Normal cardiovascular exam Rhythm:Regular Rate:Normal     Neuro/Psych negative neurological ROS     GI/Hepatic negative GI ROS, Neg liver ROS,   Endo/Other  diabetes  Renal/GU negative Renal ROS     Musculoskeletal negative musculoskeletal ROS (+)   Abdominal   Peds  Hematology negative hematology ROS (+) Lab Results      Component                Value               Date                      WBC                      7.1                 01/07/2020                HGB                      11.1 (L)            01/07/2020                HCT                      34.3 (L)            01/07/2020                MCV                      96.9                01/07/2020                PLT                      222                 01/07/2020              Anesthesia Other Findings   Reproductive/Obstetrics                            Anesthesia Physical Anesthesia Plan  ASA: II  Anesthesia Plan: General   Post-op Pain Management:  Regional for Post-op pain   Induction: Intravenous  PONV Risk Score and Plan: 4 or greater and Treatment may vary due to age or medical condition, Midazolam, Scopolamine patch - Pre-op, Ondansetron and Dexamethasone  Airway Management Planned: Oral ETT  Additional Equipment: None  Intra-op Plan:   Post-operative Plan: Extubation in OR  Informed Consent: I have reviewed the patients History and Physical, chart, labs and discussed  the procedure including the risks, benefits and alternatives for the proposed anesthesia with the patient or  authorized representative who has indicated his/her understanding and acceptance.     Dental advisory given  Plan Discussed with: Anesthesiologist and CRNA  Anesthesia Plan Comments: (GA w Bilateral Pec blocks w exparel)       Anesthesia Quick Evaluation

## 2020-04-29 ENCOUNTER — Ambulatory Visit (HOSPITAL_COMMUNITY)
Admission: RE | Admit: 2020-04-29 | Discharge: 2020-04-29 | Disposition: A | Discharge status: Home / Self Care | Payer: Managed Care, Other (non HMO) | Source: Ambulatory Visit | Attending: General Surgery | Admitting: General Surgery

## 2020-04-29 ENCOUNTER — Other Ambulatory Visit: Payer: Self-pay

## 2020-04-29 ENCOUNTER — Encounter (HOSPITAL_BASED_OUTPATIENT_CLINIC_OR_DEPARTMENT_OTHER): Payer: Self-pay | Admitting: General Surgery

## 2020-04-29 ENCOUNTER — Ambulatory Visit (HOSPITAL_BASED_OUTPATIENT_CLINIC_OR_DEPARTMENT_OTHER): Payer: Managed Care, Other (non HMO) | Admitting: Anesthesiology

## 2020-04-29 ENCOUNTER — Encounter (HOSPITAL_BASED_OUTPATIENT_CLINIC_OR_DEPARTMENT_OTHER)
Admission: RE | Disposition: A | Discharge status: Home / Self Care | Payer: Self-pay | Source: Home / Self Care | Attending: Plastic Surgery

## 2020-04-29 ENCOUNTER — Observation Stay (HOSPITAL_BASED_OUTPATIENT_CLINIC_OR_DEPARTMENT_OTHER)
Admission: RE | Admit: 2020-04-29 | Discharge: 2020-04-30 | Disposition: A | Discharge status: Home / Self Care | Payer: Managed Care, Other (non HMO) | Attending: Plastic Surgery | Admitting: Plastic Surgery

## 2020-04-29 DIAGNOSIS — D0511 Intraductal carcinoma in situ of right breast: Secondary | ICD-10-CM

## 2020-04-29 DIAGNOSIS — Z421 Encounter for breast reconstruction following mastectomy: Secondary | ICD-10-CM

## 2020-04-29 DIAGNOSIS — J45909 Unspecified asthma, uncomplicated: Secondary | ICD-10-CM | POA: Diagnosis not present

## 2020-04-29 DIAGNOSIS — N6310 Unspecified lump in the right breast, unspecified quadrant: Secondary | ICD-10-CM | POA: Diagnosis present

## 2020-04-29 DIAGNOSIS — E119 Type 2 diabetes mellitus without complications: Secondary | ICD-10-CM | POA: Insufficient documentation

## 2020-04-29 DIAGNOSIS — C50919 Malignant neoplasm of unspecified site of unspecified female breast: Secondary | ICD-10-CM | POA: Diagnosis present

## 2020-04-29 HISTORY — DX: Nontoxic single thyroid nodule: E04.1

## 2020-04-29 HISTORY — PX: NIPPLE SPARING MASTECTOMY WITH SENTINEL LYMPH NODE BIOPSY: SHX6826

## 2020-04-29 HISTORY — PX: BREAST RECONSTRUCTION WITH PLACEMENT OF TISSUE EXPANDER AND FLEX HD (ACELLULAR HYDRATED DERMIS): SHX6295

## 2020-04-29 LAB — GLUCOSE, CAPILLARY
Glucose-Capillary: 86 mg/dL (ref 70–99)
Glucose-Capillary: 150 mg/dL — ABNORMAL HIGH (ref 70–99)

## 2020-04-29 SURGERY — NIPPLE SPARING MASTECTOMY WITH SENTINEL LYMPH NODE BIOPSY
Anesthesia: General | Site: Breast | Laterality: Bilateral

## 2020-04-29 MED ORDER — ACETAMINOPHEN 500 MG PO TABS
1000.0000 mg | ORAL_TABLET | ORAL | Status: AC
  Administered 2020-04-29: 1000 mg via ORAL

## 2020-04-29 MED ORDER — ACETAMINOPHEN 500 MG PO TABS
ORAL_TABLET | ORAL | Status: AC
  Filled 2020-04-29: qty 2

## 2020-04-29 MED ORDER — PROMETHAZINE HCL 25 MG/ML IJ SOLN
12.5000 mg | Freq: Once | INTRAMUSCULAR | Status: DC | PRN
Start: 2020-04-29 — End: 2020-04-29

## 2020-04-29 MED ORDER — SODIUM CHLORIDE (PF) 0.9 % IJ SOLN
INTRAMUSCULAR | Status: AC
  Filled 2020-04-29: qty 10

## 2020-04-29 MED ORDER — DEXMEDETOMIDINE (PRECEDEX) IN NS 20 MCG/5ML (4 MCG/ML) IV SYRINGE
PREFILLED_SYRINGE | INTRAVENOUS | Status: AC
  Filled 2020-04-29: qty 15

## 2020-04-29 MED ORDER — DEXMEDETOMIDINE (PRECEDEX) IN NS 20 MCG/5ML (4 MCG/ML) IV SYRINGE
PREFILLED_SYRINGE | INTRAVENOUS | Status: DC | PRN
  Administered 2020-04-29 (×3): 4 ug via INTRAVENOUS
  Administered 2020-04-29: 8 ug via INTRAVENOUS

## 2020-04-29 MED ORDER — AMISULPRIDE (ANTIEMETIC) 5 MG/2ML IV SOLN
10.0000 mg | Freq: Once | INTRAVENOUS | Status: AC | PRN
  Administered 2020-04-29: 10 mg via INTRAVENOUS

## 2020-04-29 MED ORDER — TECHNETIUM TC 99M TILMANOCEPT KIT
1.0000 | PACK | Freq: Once | INTRAVENOUS | Status: AC | PRN
  Administered 2020-04-29: 1 via INTRADERMAL

## 2020-04-29 MED ORDER — HYDROMORPHONE HCL 1 MG/ML IJ SOLN: 0.5000 mg | INTRAMUSCULAR | Status: DC | PRN

## 2020-04-29 MED ORDER — IBUPROFEN 600 MG PO TABS
600.0000 mg | ORAL_TABLET | Freq: Four times a day (QID) | ORAL | Status: DC | PRN
  Administered 2020-04-29: 600 mg via ORAL
  Filled 2020-04-29: qty 1

## 2020-04-29 MED ORDER — CEFAZOLIN SODIUM-DEXTROSE 2-4 GM/100ML-% IV SOLN
INTRAVENOUS | Status: AC
  Filled 2020-04-29: qty 100

## 2020-04-29 MED ORDER — PROMETHAZINE HCL 25 MG/ML IJ SOLN
6.2500 mg | Freq: Once | INTRAMUSCULAR | Status: AC | PRN
  Administered 2020-04-29: 6.25 mg via INTRAVENOUS

## 2020-04-29 MED ORDER — ACETAMINOPHEN 325 MG PO TABS: 650.0000 mg | ORAL_TABLET | Freq: Four times a day (QID) | ORAL | Status: DC | PRN

## 2020-04-29 MED ORDER — FENTANYL CITRATE (PF) 100 MCG/2ML IJ SOLN
INTRAMUSCULAR | Status: AC
  Filled 2020-04-29: qty 2

## 2020-04-29 MED ORDER — METHYLENE BLUE 0.5 % INJ SOLN
INTRAVENOUS | Status: AC
  Filled 2020-04-29: qty 10

## 2020-04-29 MED ORDER — ONDANSETRON 4 MG PO TBDP: 4.0000 mg | ORAL_TABLET | Freq: Four times a day (QID) | ORAL | Status: DC | PRN

## 2020-04-29 MED ORDER — MIDAZOLAM HCL 2 MG/2ML IJ SOLN
INTRAMUSCULAR | Status: AC
  Filled 2020-04-29: qty 2

## 2020-04-29 MED ORDER — FENTANYL CITRATE (PF) 100 MCG/2ML IJ SOLN
50.0000 ug | Freq: Once | INTRAMUSCULAR | Status: AC
  Administered 2020-04-29: 100 ug via INTRAVENOUS

## 2020-04-29 MED ORDER — PHENYLEPHRINE 40 MCG/ML (10ML) SYRINGE FOR IV PUSH (FOR BLOOD PRESSURE SUPPORT)
PREFILLED_SYRINGE | INTRAVENOUS | Status: AC
  Filled 2020-04-29: qty 10

## 2020-04-29 MED ORDER — SODIUM CHLORIDE 0.9 % IV SOLN
INTRAVENOUS | Status: DC | PRN
  Administered 2020-04-29: 500 mL

## 2020-04-29 MED ORDER — BUPIVACAINE HCL (PF) 0.25 % IJ SOLN
INTRAMUSCULAR | Status: AC
  Filled 2020-04-29: qty 30

## 2020-04-29 MED ORDER — SCOPOLAMINE 1 MG/3DAYS TD PT72
MEDICATED_PATCH | TRANSDERMAL | Status: AC
  Filled 2020-04-29: qty 1

## 2020-04-29 MED ORDER — OXYCODONE HCL 5 MG/5ML PO SOLN: 5.0000 mg | Freq: Once | ORAL | Status: DC | PRN

## 2020-04-29 MED ORDER — ROCURONIUM BROMIDE 10 MG/ML (PF) SYRINGE
PREFILLED_SYRINGE | INTRAVENOUS | Status: AC
  Filled 2020-04-29: qty 10

## 2020-04-29 MED ORDER — METHOCARBAMOL 500 MG PO TABS
500.0000 mg | ORAL_TABLET | Freq: Three times a day (TID) | ORAL | Status: DC
  Administered 2020-04-29: 500 mg via ORAL
  Filled 2020-04-29: qty 1

## 2020-04-29 MED ORDER — ONDANSETRON HCL 4 MG/2ML IJ SOLN
INTRAMUSCULAR | Status: AC
  Filled 2020-04-29: qty 2

## 2020-04-29 MED ORDER — SULFAMETHOXAZOLE-TRIMETHOPRIM 800-160 MG PO TABS
1.0000 | ORAL_TABLET | Freq: Two times a day (BID) | ORAL | Status: DC
  Administered 2020-04-29: 1 via ORAL
  Filled 2020-04-29 (×4): qty 1

## 2020-04-29 MED ORDER — FENTANYL CITRATE (PF) 100 MCG/2ML IJ SOLN
INTRAMUSCULAR | Status: DC | PRN
  Administered 2020-04-29: 25 ug via INTRAVENOUS
  Administered 2020-04-29: 50 ug via INTRAVENOUS
  Administered 2020-04-29: 25 ug via INTRAVENOUS

## 2020-04-29 MED ORDER — HYDROCODONE-ACETAMINOPHEN 5-325 MG PO TABS
1.0000 | ORAL_TABLET | ORAL | Status: DC | PRN
  Administered 2020-04-29: 1 via ORAL
  Administered 2020-04-29 – 2020-04-30 (×3): 2 via ORAL
  Filled 2020-04-29: qty 2
  Filled 2020-04-29: qty 1
  Filled 2020-04-29 (×2): qty 2

## 2020-04-29 MED ORDER — KETOROLAC TROMETHAMINE 15 MG/ML IJ SOLN
15.0000 mg | INTRAMUSCULAR | Status: AC
  Administered 2020-04-29: 15 mg via INTRAVENOUS

## 2020-04-29 MED ORDER — HYDROMORPHONE HCL 1 MG/ML IJ SOLN
0.2500 mg | INTRAMUSCULAR | Status: DC | PRN
  Administered 2020-04-29: 0.5 mg via INTRAVENOUS

## 2020-04-29 MED ORDER — SUGAMMADEX SODIUM 200 MG/2ML IV SOLN
INTRAVENOUS | Status: DC | PRN
  Administered 2020-04-29: 130 mg via INTRAVENOUS

## 2020-04-29 MED ORDER — EPHEDRINE SULFATE 50 MG/ML IJ SOLN
INTRAMUSCULAR | Status: DC | PRN
  Administered 2020-04-29: 10 mg via INTRAVENOUS

## 2020-04-29 MED ORDER — ONDANSETRON HCL 4 MG/2ML IJ SOLN
4.0000 mg | Freq: Four times a day (QID) | INTRAMUSCULAR | Status: DC | PRN
  Filled 2020-04-29: qty 2

## 2020-04-29 MED ORDER — MIDAZOLAM HCL 2 MG/2ML IJ SOLN
1.0000 mg | Freq: Once | INTRAMUSCULAR | Status: AC
  Administered 2020-04-29: 2 mg via INTRAVENOUS

## 2020-04-29 MED ORDER — KETOROLAC TROMETHAMINE 15 MG/ML IJ SOLN
INTRAMUSCULAR | Status: AC
  Filled 2020-04-29: qty 1

## 2020-04-29 MED ORDER — OXYCODONE HCL 5 MG PO TABS: 5.0000 mg | ORAL_TABLET | Freq: Once | ORAL | Status: DC | PRN

## 2020-04-29 MED ORDER — INDOCYANINE GREEN 25 MG IV SOLR
INTRAVENOUS | Status: DC | PRN
  Administered 2020-04-29 (×2): 7.5 mg via INTRAVENOUS

## 2020-04-29 MED ORDER — PROPOFOL 10 MG/ML IV BOLUS
INTRAVENOUS | Status: DC | PRN
Start: 2020-04-29 — End: 2020-04-29
  Administered 2020-04-29: 100 mg via INTRAVENOUS

## 2020-04-29 MED ORDER — ACETAMINOPHEN 325 MG RE SUPP: 650.0000 mg | Freq: Four times a day (QID) | RECTAL | Status: DC | PRN

## 2020-04-29 MED ORDER — PROMETHAZINE HCL 25 MG/ML IJ SOLN
INTRAMUSCULAR | Status: AC
  Filled 2020-04-29: qty 1

## 2020-04-29 MED ORDER — SCOPOLAMINE 1 MG/3DAYS TD PT72
1.0000 | MEDICATED_PATCH | TRANSDERMAL | Status: DC
  Administered 2020-04-29: 1.5 mg via TRANSDERMAL

## 2020-04-29 MED ORDER — PROPOFOL 10 MG/ML IV BOLUS
INTRAVENOUS | Status: AC
  Filled 2020-04-29: qty 40

## 2020-04-29 MED ORDER — PHENYLEPHRINE HCL (PRESSORS) 10 MG/ML IV SOLN
INTRAVENOUS | Status: DC | PRN
  Administered 2020-04-29 (×3): 40 ug via INTRAVENOUS
  Administered 2020-04-29: 80 ug via INTRAVENOUS
  Administered 2020-04-29 (×4): 40 ug via INTRAVENOUS

## 2020-04-29 MED ORDER — BUPIVACAINE LIPOSOME 1.3 % IJ SUSP
INTRAMUSCULAR | Status: DC | PRN
  Administered 2020-04-29 (×2): 10 mL

## 2020-04-29 MED ORDER — BUPIVACAINE HCL (PF) 0.25 % IJ SOLN
INTRAMUSCULAR | Status: DC | PRN
  Administered 2020-04-29 (×2): 20 mL

## 2020-04-29 MED ORDER — MEPERIDINE HCL 25 MG/ML IJ SOLN: 6.2500 mg | INTRAMUSCULAR | Status: DC | PRN

## 2020-04-29 MED ORDER — LACTATED RINGERS IV SOLN: INTRAVENOUS | Status: DC

## 2020-04-29 MED ORDER — AMISULPRIDE (ANTIEMETIC) 5 MG/2ML IV SOLN
INTRAVENOUS | Status: AC
  Filled 2020-04-29: qty 4

## 2020-04-29 MED ORDER — HYDROMORPHONE HCL 1 MG/ML IJ SOLN
INTRAMUSCULAR | Status: AC
  Filled 2020-04-29: qty 0.5

## 2020-04-29 MED ORDER — ROCURONIUM BROMIDE 100 MG/10ML IV SOLN
INTRAVENOUS | Status: DC | PRN
  Administered 2020-04-29: 20 mg via INTRAVENOUS
  Administered 2020-04-29: 40 mg via INTRAVENOUS

## 2020-04-29 MED ORDER — LIDOCAINE 2% (20 MG/ML) 5 ML SYRINGE
INTRAMUSCULAR | Status: DC | PRN
  Administered 2020-04-29: 80 mg via INTRAVENOUS

## 2020-04-29 MED ORDER — ACETAMINOPHEN 10 MG/ML IV SOLN: 1000.0000 mg | Freq: Once | INTRAVENOUS | Status: DC | PRN

## 2020-04-29 MED ORDER — LIDOCAINE 2% (20 MG/ML) 5 ML SYRINGE
INTRAMUSCULAR | Status: AC
  Filled 2020-04-29: qty 5

## 2020-04-29 MED ORDER — ONDANSETRON HCL 4 MG/2ML IJ SOLN
INTRAMUSCULAR | Status: DC | PRN
  Administered 2020-04-29: 4 mg via INTRAVENOUS

## 2020-04-29 MED ORDER — EPHEDRINE 5 MG/ML INJ
INTRAVENOUS | Status: AC
  Filled 2020-04-29: qty 10

## 2020-04-29 MED ORDER — DEXAMETHASONE SODIUM PHOSPHATE 10 MG/ML IJ SOLN
INTRAMUSCULAR | Status: DC | PRN
  Administered 2020-04-29: 10 mg via INTRAVENOUS

## 2020-04-29 MED ORDER — CEFAZOLIN SODIUM-DEXTROSE 2-4 GM/100ML-% IV SOLN
2.0000 g | INTRAVENOUS | Status: AC
  Administered 2020-04-29: 2 g via INTRAVENOUS

## 2020-04-29 MED ORDER — DEXAMETHASONE SODIUM PHOSPHATE 10 MG/ML IJ SOLN
INTRAMUSCULAR | Status: AC
  Filled 2020-04-29: qty 1

## 2020-04-29 SURGICAL SUPPLY — 108 items
APPLIER CLIP 11 MED OPEN (CLIP) ×2
APPLIER CLIP 9.375 MED OPEN (MISCELLANEOUS)
BAG DECANTER FOR FLEXI CONT (MISCELLANEOUS) ×2 IMPLANT
BINDER BREAST LRG (GAUZE/BANDAGES/DRESSINGS) IMPLANT
BINDER BREAST MEDIUM (GAUZE/BANDAGES/DRESSINGS) IMPLANT
BINDER BREAST XLRG (GAUZE/BANDAGES/DRESSINGS) IMPLANT
BINDER BREAST XXLRG (GAUZE/BANDAGES/DRESSINGS) IMPLANT
BIOPATCH RED 1 DISK 7.0 (GAUZE/BANDAGES/DRESSINGS) ×4 IMPLANT
BLADE CLIPPER SURG (BLADE) IMPLANT
BLADE SURG 10 STRL SS (BLADE) ×4 IMPLANT
BLADE SURG 15 STRL LF DISP TIS (BLADE) ×1 IMPLANT
BLADE SURG 15 STRL SS (BLADE) ×1
BNDG ELASTIC 6X10 VLCR STRL LF (GAUZE/BANDAGES/DRESSINGS) ×4 IMPLANT
BNDG ELASTIC 6X5.8 VLCR STR LF (GAUZE/BANDAGES/DRESSINGS) IMPLANT
BNDG GAUZE ELAST 4 BULKY (GAUZE/BANDAGES/DRESSINGS) ×4 IMPLANT
BREAST EXPANDER 350 (Breast) ×4 IMPLANT
CANISTER SUCT 1200ML W/VALVE (MISCELLANEOUS) ×2 IMPLANT
CHLORAPREP W/TINT 26 (MISCELLANEOUS) IMPLANT
CLIP APPLIE 11 MED OPEN (CLIP) ×1 IMPLANT
CLIP APPLIE 9.375 MED OPEN (MISCELLANEOUS) IMPLANT
CLIP VESOCCLUDE SM WIDE 6/CT (CLIP) IMPLANT
COVER BACK TABLE 60X90IN (DRAPES) ×2 IMPLANT
COVER MAYO STAND STRL (DRAPES) ×2 IMPLANT
COVER PROBE W GEL 5X96 (DRAPES) ×2 IMPLANT
COVER WAND RF STERILE (DRAPES) IMPLANT
DECANTER SPIKE VIAL GLASS SM (MISCELLANEOUS) IMPLANT
DERMABOND ADVANCED (GAUZE/BANDAGES/DRESSINGS)
DERMABOND ADVANCED .7 DNX12 (GAUZE/BANDAGES/DRESSINGS) IMPLANT
DRAIN CHANNEL 15F RND FF W/TCR (WOUND CARE) ×4 IMPLANT
DRAIN CHANNEL 19F RND (DRAIN) IMPLANT
DRAPE LAPAROSCOPIC ABDOMINAL (DRAPES) ×2 IMPLANT
DRAPE TOP ARMCOVERS (MISCELLANEOUS) ×2 IMPLANT
DRAPE U-SHAPE 76X120 STRL (DRAPES) ×2 IMPLANT
DRAPE UTILITY XL STRL (DRAPES) ×2 IMPLANT
DRESSING PRVNA BELLAFORM 21X19 (GAUZE/BANDAGES/DRESSINGS) ×1 IMPLANT
DRSG PAD ABDOMINAL 8X10 ST (GAUZE/BANDAGES/DRESSINGS) ×4 IMPLANT
DRSG PREVENA BELLAFORM 21X19 (GAUZE/BANDAGES/DRESSINGS) ×2
DRSG TEGADERM 2-3/8X2-3/4 SM (GAUZE/BANDAGES/DRESSINGS) ×4 IMPLANT
DRSG TEGADERM 4X10 (GAUZE/BANDAGES/DRESSINGS) IMPLANT
DRSG TEGADERM 4X4.75 (GAUZE/BANDAGES/DRESSINGS) IMPLANT
DURAPREP 26ML APPLICATOR (WOUND CARE) ×4 IMPLANT
ELECT BLADE 4.0 EZ CLEAN MEGAD (MISCELLANEOUS) ×2
ELECT COATED BLADE 2.86 ST (ELECTRODE) ×2 IMPLANT
ELECT REM PT RETURN 9FT ADLT (ELECTROSURGICAL) ×2
ELECTRODE BLDE 4.0 EZ CLN MEGD (MISCELLANEOUS) ×1 IMPLANT
ELECTRODE REM PT RTRN 9FT ADLT (ELECTROSURGICAL) ×1 IMPLANT
EVACUATOR SILICONE 100CC (DRAIN) ×4 IMPLANT
EXPANDER BREAST 350 (Breast) ×2 IMPLANT
FUNNEL KELLER 2 DISP (MISCELLANEOUS) IMPLANT
GAUZE SPONGE 4X4 12PLY STRL LF (GAUZE/BANDAGES/DRESSINGS) IMPLANT
GLOVE BIO SURGEON STRL SZ7.5 (GLOVE) ×4 IMPLANT
GLOVE BIOGEL PI IND STRL 7.5 (GLOVE) ×1 IMPLANT
GLOVE BIOGEL PI INDICATOR 7.5 (GLOVE) ×1
GLOVE SRG 8 PF TXTR STRL LF DI (GLOVE) ×1 IMPLANT
GLOVE SURG ENC MOIS LTX SZ7 (GLOVE) ×2 IMPLANT
GLOVE SURG ENC TEXT LTX SZ7.5 (GLOVE) ×4 IMPLANT
GLOVE SURG UNDER POLY LF SZ8 (GLOVE) ×1
GOWN STRL REUS W/ TWL LRG LVL3 (GOWN DISPOSABLE) ×5 IMPLANT
GOWN STRL REUS W/TWL LRG LVL3 (GOWN DISPOSABLE) ×5
GRAFT FLEX HD 19X22X0.7-1.4 (Tissue) ×4 IMPLANT
HEMOSTAT ARISTA ABSORB 3G PWDR (HEMOSTASIS) IMPLANT
IV NS 500ML (IV SOLUTION)
IV NS 500ML BAXH (IV SOLUTION) IMPLANT
KIT FILL SYSTEM UNIVERSAL (SET/KITS/TRAYS/PACK) ×2 IMPLANT
LIGHT WAVEGUIDE WIDE FLAT (MISCELLANEOUS) ×2 IMPLANT
MARKER SKIN DUAL TIP RULER LAB (MISCELLANEOUS) IMPLANT
NDL SAFETY ECLIPSE 18X1.5 (NEEDLE) IMPLANT
NEEDLE HYPO 18GX1.5 SHARP (NEEDLE)
NEEDLE HYPO 25X1 1.5 SAFETY (NEEDLE) IMPLANT
NS IRRIG 1000ML POUR BTL (IV SOLUTION) ×2 IMPLANT
PACK BASIN DAY SURGERY FS (CUSTOM PROCEDURE TRAY) ×2 IMPLANT
PACK SPY-PHI (KITS) ×2 IMPLANT
PENCIL SMOKE EVACUATOR (MISCELLANEOUS) ×2 IMPLANT
PIN SAFETY STERILE (MISCELLANEOUS) ×2 IMPLANT
RETRACTOR ONETRAX LX 135X30 (MISCELLANEOUS) IMPLANT
RETRACTOR ONETRAX LX 90X20 (MISCELLANEOUS) IMPLANT
SHEET MEDIUM DRAPE 40X70 STRL (DRAPES) ×4 IMPLANT
SLEEVE SCD COMPRESS KNEE MED (MISCELLANEOUS) ×2 IMPLANT
SPONGE LAP 18X18 RF (DISPOSABLE) ×10 IMPLANT
SPONGE LAP 4X18 RFD (DISPOSABLE) IMPLANT
STAPLER VISISTAT 35W (STAPLE) ×2 IMPLANT
STRIP CLOSURE SKIN 1/2X4 (GAUZE/BANDAGES/DRESSINGS) ×2 IMPLANT
SUT ETHILON 2 0 FS 18 (SUTURE) IMPLANT
SUT MNCRL AB 4-0 PS2 18 (SUTURE) ×2 IMPLANT
SUT MON AB 3-0 SH 27 (SUTURE)
SUT MON AB 3-0 SH27 (SUTURE) IMPLANT
SUT MON AB 4-0 PC3 18 (SUTURE) IMPLANT
SUT PDS 3-0 CT2 (SUTURE) ×26
SUT PDS II 3-0 CT2 27 ABS (SUTURE) ×13 IMPLANT
SUT PLAIN 5 0 P 3 18 (SUTURE) IMPLANT
SUT SILK 2 0 SH (SUTURE) IMPLANT
SUT VIC AB 2-0 SH 27 (SUTURE)
SUT VIC AB 2-0 SH 27XBRD (SUTURE) IMPLANT
SUT VIC AB 3-0 54X BRD REEL (SUTURE) IMPLANT
SUT VIC AB 3-0 BRD 54 (SUTURE)
SUT VIC AB 3-0 SH 27 (SUTURE)
SUT VIC AB 3-0 SH 27X BRD (SUTURE) IMPLANT
SUT VICRYL 3-0 CR8 SH (SUTURE) ×2 IMPLANT
SUT VLOC 90 P-14 23 (SUTURE) ×4 IMPLANT
SYR BULB IRRIG 60ML STRL (SYRINGE) ×2 IMPLANT
SYR CONTROL 10ML LL (SYRINGE) IMPLANT
TAPE MEASURE VINYL STERILE (MISCELLANEOUS) IMPLANT
TOWEL GREEN STERILE FF (TOWEL DISPOSABLE) ×4 IMPLANT
TRAY CATH 16FR W/PLASTIC CATH (SET/KITS/TRAYS/PACK) ×2 IMPLANT
TRAY FOLEY W/BAG SLVR 14FR LF (SET/KITS/TRAYS/PACK) IMPLANT
TUBE CONNECTING 20X1/4 (TUBING) ×2 IMPLANT
UNDERPAD 30X36 HEAVY ABSORB (UNDERPADS AND DIAPERS) ×4 IMPLANT
YANKAUER SUCT BULB TIP NO VENT (SUCTIONS) ×2 IMPLANT

## 2020-04-29 NOTE — Discharge Instructions (Addendum)
Activity As tolerated: No shower until breast bella vac is removed in 1 week.  Keep ACE wrap on 24/7 unless showering or changing dressings. This is important for pain and swelling control.  NO driving while in pain, taking pain medication or if you are unable to safely react to traffic/turn your vehicle due to pain No heavy activities. No lifting > 15 pounds for 6 weeks  Take Pain medication (Norco) as needed for severe pain. Otherwise, you can use ibuprofen or tylenol as needed as well.  Norco last given at 6:00am  Diet: Regular. Drink plenty of fluids (water, avoid juice/soda) and eat healthy, high protein, low carbs.  Wound Care: Keep dressing clean & dry.  Special Instructions: Call Doctor if any unusual problems occur such as pain, excessive Bleeding, unrelieved Nausea/vomiting, Fever &/or chills  Drains: Measure drain output from drains every 24 hours. Record this on a log for [place] to view during post-op appointments. Drainage will change colors over the next week to two weeks. This is normal. Color of drainage can vary from red-pink-orange-yellow.  Follow-up appointment: Scheduled for next week.     Information for Discharge Teaching: EXPAREL (bupivacaine liposome injectable suspension)   Your surgeon or anesthesiologist gave you EXPAREL(bupivacaine) to help control your pain after surgery.   EXPAREL is a local anesthetic that provides pain relief by numbing the tissue around the surgical site.  EXPAREL is designed to release pain medication over time and can control pain for up to 72 hours.  Depending on how you respond to EXPAREL, you may require less pain medication during your recovery.  Possible side effects:  Temporary loss of sensation or ability to move in the area where bupivacaine was injected.  Nausea, vomiting, constipation  Rarely, numbness and tingling in your mouth or lips, lightheadedness, or anxiety may occur.  Call your doctor right away if you think  you may be experiencing any of these sensations, or if you have other questions regarding possible side effects.  Follow all other discharge instructions given to you by your surgeon or nurse. Eat a healthy diet and drink plenty of water or other fluids.  If you return to the hospital for any reason within 96 hours following the administration of EXPAREL, it is important for health care providers to know that you have received this anesthetic. A teal colored band has been placed on your arm with the date, time and amount of EXPAREL you have received in order to alert and inform your health care providers. Please leave this armband in place for the full 96 hours following administration, and then you may remove the band.    JP Drain [employer] this sheet to all of your post-operative appointments while you have your drains.  Please measure your drains by CC's or ML's.  Make sure you drain and measure your JP Drains 2 or 3 times per day.  At the end of each day, add up totals for the left side and add up totals for the right side.    ( 9 am )     ( 3 pm )        ( 9 pm )                Date L  R  L  R  L  R  Total L/R

## 2020-04-29 NOTE — Interval H&P Note (Signed)
History and Physical Interval Note:  04/29/2020 7:01 AM  Santanna Whitford  has presented today for surgery, with the diagnosis of BREAST CANCER.  The various methods of treatment have been discussed with the patient and family. After consideration of risks, benefits and other options for treatment, the patient has consented to  Procedure(s) with comments: BILATERAL NIPPLE SPARING MASTECTOMIES WITH RIGHT AXILLARY SENTINEL LYMPH NODE BIOPSY (Bilateral) - BILATERAL PEC BLOCK, RNFA IMMEDIATE BILATERAL BREAST RECONSTRUCTION WITH PLACEMENT OF TISSUE EXPANDER AND FLEX HD (ACELLULAR HYDRATED DERMIS) (Bilateral) as a surgical intervention.  The patient's history has been reviewed, patient examined, no change in status, stable for surgery.  I have reviewed the patient's chart and labs.  Questions were answered to the patient's satisfaction.     Emelia Loron

## 2020-04-29 NOTE — Anesthesia Procedure Notes (Addendum)
Anesthesia Regional Block: Pectoralis block   Pre-Anesthetic Checklist: ,, timeout performed, Correct Patient, Correct Site, Correct Laterality, Correct Procedure, Correct Position, site marked, Risks and benefits discussed,  Surgical consent,  Pre-op evaluation,  At surgeon's request and post-op pain management  Laterality: Left and Upper  Prep: chloraprep       Needles:  Injection technique: Single-shot  Needle Type: Echogenic Needle     Needle Length: 9cm  Needle Gauge: 21     Additional Needles:   Procedures:,,,, ultrasound used (permanent image in chart),,,,  Narrative:  Start time: 04/29/2020 7:10 AM End time: 04/29/2020 7:17 AM Injection made incrementally with aspirations every 5 mL.  Performed by: Personally  Anesthesiologist: Trevor Iha, MD  Additional Notes: Block assessed. Patient tolerated procedure well.

## 2020-04-29 NOTE — Anesthesia Procedure Notes (Signed)
Anesthesia Regional Block: Pectoralis block   Pre-Anesthetic Checklist: ,, timeout performed, Correct Patient, Correct Site, Correct Laterality, Correct Procedure, Correct Position, site marked, Risks and benefits discussed,  Surgical consent,  Pre-op evaluation,  At surgeon's request and post-op pain management  Laterality: Upper and Right  Prep: chloraprep       Needles:  Injection technique: Single-shot  Needle Type: Echogenic Needle     Needle Length: 9cm  Needle Gauge: 21     Additional Needles:   Procedures:,,,, ultrasound used (permanent image in chart),,,,  Narrative:  Start time: 04/29/2020 7:20 AM End time: 04/29/2020 7:28 AM Injection made incrementally with aspirations every 5 mL.  Performed by: Personally  Anesthesiologist: Trevor Iha, MD  Additional Notes: Block assessed. Patient tolerated procedure well.

## 2020-04-29 NOTE — Anesthesia Postprocedure Evaluation (Signed)
Anesthesia Post Note  Patient: Natalie Cardenas  Procedure(s) Performed: BILATERAL NIPPLE SPARING MASTECTOMIES WITH RIGHT AXILLARY SENTINEL LYMPH NODE BIOPSY (Bilateral Breast) IMMEDIATE BILATERAL BREAST RECONSTRUCTION WITH PLACEMENT OF TISSUE EXPANDER AND FLEX HD (ACELLULAR HYDRATED DERMIS) (Bilateral Breast)     Patient location during evaluation: PACU Anesthesia Type: General Level of consciousness: awake and alert Pain management: pain level controlled Vital Signs Assessment: post-procedure vital signs reviewed and stable Respiratory status: spontaneous breathing, nonlabored ventilation, respiratory function stable and patient connected to nasal cannula oxygen Cardiovascular status: blood pressure returned to baseline and stable Postop Assessment: no apparent nausea or vomiting Anesthetic complications: no   No complications documented.  Last Vitals:  Vitals:   04/29/20 1400 04/29/20 1500  BP: (!) 109/56   Pulse: 88   Resp: 16   Temp: (!) 36.1 C   SpO2: 100% 93%    Last Pain:  Vitals:   04/29/20 1500  TempSrc:   PainSc: Asleep                 Trevor Iha

## 2020-04-29 NOTE — Progress Notes (Signed)
Assisted Dr. Valma Cava with right, left, ultrasound guided, pectoralis block. Side rails up, monitors on throughout procedure. See vital signs in flow sheet. Tolerated Procedure well.

## 2020-04-29 NOTE — Op Note (Signed)
Preoperative diagnosis: right breast dcis Postoperative diagnosis: Same as above Procedure: 1.  Left risk reducing nipple sparing mastectomy 2.  Right nipple sparing mastectomy 3.  Right deep axillary sentinel lymph node biopsy Surgeon: Dr. Harden Mo Anesthesia: General with bilateral pectoral block Estimated blood loss: 50 cc Drains: Per plastic surgery Specimens: 1.  Left nipple sparing mastectomy marked short superior, long lateral, double nipple areolar complex 2.  Left nipple specimen 3.  Right nipple sparing mastectomy marked short superior, long lateral, double nipple areolar complex 4.  Right nipple specimen 5.  Right deep axillary sentinel lymph nodes with highest count of 8606 Complications: None Special count was correct at completion Disposition Case turned over to plastic surgery for reconstruction  Indications:  44 yof no prior breast history, no family history, had first mm that shows right breast calcs. this is c density. there is outer right breast retroareolar breast mass measuring 3.5 cm in size. the clips are only 1.8 cm apart. 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 genetics is negative. mri showed a 3.3x3x3.8 cm area on right and a 4x1.2x1.7 cm area of nme on left. the left was biopsied and is fa change benign concordant.  She is elected to proceed with left nipple sparing risk reducing mastectomy and right nipple sparing mastectomy with right axillary sentinel lymph node biopsy.  Procedure: After informed consent was obtained the patient first underwent bilateral pectoral blocks with anesthesia.  She was injected with Lymphoseek in the standard fashion on the right side.  She was given antibiotics.  SCDs were in place.  She was placed under general anesthesia without complication.  She was prepped and draped in the standard sterile surgical fashion.  We used DuraPrep as she was having a lot of itching from the CHG wipes preoperatively.  A surgical  timeout was then performed.  I performed a left nipple sparing mastectomy first.  I marked out the inframammary fold.  I made an incision 8 cm from the sternum and carried this 12 cm laterally.  I then proceeded to remove the pectoralis fascia and the breast from the pectoralis muscle.  I then proceeded to dissect the anterior flap with a combination of cautery as well as the facelift scissors.  This was done to the parasternal region, latissimus, and near the clavicle.  I then remove the breast tissue.  This was marked as above.  I did remove several other small areas on the flap.  I felt confident that I removed all the breast tissue.  I then remove the nipple specimen with a 15 blade.  Hemostasis was observed.  I then packed the sponge on the side and turned to the right side.  I made a similar incision on the right side.  I then proceeded to remove the pectoralis fascia and the breast from the pectoralis muscle.  I then dissected the anterior flap again with a combination of cautery and the facelift scissors to the parasternal region, latissimus, and near the clavicle.  The breast tissue was then removed and marked as above.  I did remove several small areas from the flap.  I then remove the nipple specimen with a 15 blade.  Hemostasis was obtained.  The sentinel node was identified is fairly low lying.  I was able to grasp it with an Allis clamp through the inframammary incision.  I then used cautery to remove the sentinel node.  The count was listed as above.  The background count  was well less than 10% of that.  There were no palpable nodes.  I then closed the axilla with 3-0 Vicryl suture.  I then packed this side and the case was turned over to plastic surgery for reconstruction.

## 2020-04-29 NOTE — H&P (Signed)
45 yof no prior breast history, no family history, had first mm that shows right breast calcs. this is c density. there is outer right breast retroareolar breast mass measuring 3.5 cm in size. the clips are only 1.8 cm apart. 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 genetics is negative. mri showed a 3.3x3x3.8 cm area on right and a 4x1.2x1.7 cm area of nme on left. the left was biopsied and is fa change benign concordant. she is recommended for six month follow up on this side. we have discussed multiple options for surgery. she has seen two plastic surgeons now as well. she returns today. I did start her on tamoxifen a couple weeks ago and is having some anxiety and hot flashes that are difficult sleeping.    Past Surgical History  Breast Biopsy  Right. Tonsillectomy   Diagnostic Studies History  Colonoscopy  never Mammogram  within last year Pap Smear  1-5 years ago  Allergies  No Known Drug Allergies  Allergies Reconciled   Medication History  Tamoxifen Citrate (10MG  Tablet, 1 (one) Oral at bedtime, Taken starting 02/08/2020) Active. Prenatal (Oral) Active. Iron (Oral) Specific strength unknown - Active. Probiotic (Oral) Specific strength unknown - Active. Medications Reconciled  Pregnancy / Birth History  Age at menarche  42 years. Contraceptive History  Oral contraceptives. Gravida  2 Irregular periods  Length (months) of breastfeeding  7-12 Maternal age  31-25 Para  2  Other Problems  Anxiety Disorder  Asthma  Back Pain  Bladder Problems  Breast Cancer  Diabetes Mellitus  Lump In Breast  Migraine Headache  Thyroid Disease    Review of Systems  General Present- Fatigue. Not Present- Appetite Loss, Chills, Fever, Night Sweats, Weight Gain and Weight Loss. HEENT Present- Earache. Not Present- Hearing Loss, Hoarseness, Nose Bleed, Oral Ulcers, Ringing in the Ears, Seasonal Allergies, Sinus Pain, Sore Throat, Visual  Disturbances, Wears glasses/contact lenses and Yellow Eyes. Breast Present- Breast Mass and Breast Pain. Not Present- Nipple Discharge and Skin Changes. Cardiovascular Present- Shortness of Breath. Not Present- Chest Pain, Difficulty Breathing Lying Down, Leg Cramps, Palpitations, Rapid Heart Rate and Swelling of Extremities. Gastrointestinal Present- Constipation. Not Present- Abdominal Pain, Bloating, Bloody Stool, Change in Bowel Habits, Chronic diarrhea, Difficulty Swallowing, Excessive gas, Gets full quickly at meals, Hemorrhoids, Indigestion, Nausea, Rectal Pain and Vomiting. Female Genitourinary Present- Frequency. Not Present- Nocturia, Painful Urination, Pelvic Pain and Urgency. Musculoskeletal Present- Back Pain. Not Present- Joint Pain, Joint Stiffness, Muscle Pain, Muscle Weakness and Swelling of Extremities. Neurological Present- Headaches. Not Present- Decreased Memory, Fainting, Numbness, Seizures, Tingling, Tremor, Trouble walking and Weakness. Psychiatric Present- Anxiety. Not Present- Bipolar, Change in Sleep Pattern, Depression, Fearful and Frequent crying. Endocrine Present- Hair Changes and New Diabetes. Not Present- Cold Intolerance, Excessive Hunger, Heat Intolerance and Hot flashes.   Physical Exam Rolm Bookbinder MD; 02/22/2020 8:55 AM) Breast Nipples-No Discharge. Breast Lump-No Palpable Breast Mass.   Assessment & Plan  BREAST NEOPLASM, TIS (DCIS), RIGHT (D05.11)  we discussed all options today again: 1. lumpectomy/radiotherapy/antiestrogen- this is least surgery, discussed pos margin rate with her and possibility that she might need mastectomy based on final pathology. 2. unilateral ssm/nsm- discussed I think nsm with where this is will be pushing it but could try and if positive would need to return for nac excision, likely no radiation, would do sn biopsy and discussed lymphedema, would get recommended tamoxifen for reduction on other side, symmetry an  issue 3. bilateral mastectomies- ssm/nsm as above, would  likely not need radiotherapy or tamoxifen, discused not 100% preventive all of these options are the same for her dcis treatment, is personal choice on what she wants to do. she is going to consider options over the weekend and call me Monday  Addendum: due to young age, follow up etc she has elected for bilateral nsm with expander reconstruction, will do sn on dcis side

## 2020-04-29 NOTE — Brief Op Note (Signed)
04/29/2020  11:26 AM  PATIENT:  Natalie Cardenas  45 y.o. female  PRE-OPERATIVE DIAGNOSIS:  BREAST CANCER  POST-OPERATIVE DIAGNOSIS:  BREAST CANCER  PROCEDURE:  Procedure(s) with comments: BILATERAL NIPPLE SPARING MASTECTOMIES WITH RIGHT AXILLARY SENTINEL LYMPH NODE BIOPSY (Bilateral) - BILATERAL PEC BLOCK, RNFA IMMEDIATE BILATERAL BREAST RECONSTRUCTION WITH PLACEMENT OF TISSUE EXPANDER AND FLEX HD (ACELLULAR HYDRATED DERMIS) (Bilateral)  SURGEON:  Surgeon(s) and Role: Panel 1:    Emelia Loron, MD - Primary Panel 2:    * Allena Napoleon, MD - Primary  PHYSICIAN ASSISTANT: Materials engineer, PA  ASSISTANTS: none   ANESTHESIA:   general  EBL:  50 mL   BLOOD ADMINISTERED:none  DRAINS: (2) Jackson-Pratt drain(s) with closed bulb suction in the chest   LOCAL MEDICATIONS USED:  MARCAINE     SPECIMEN:  Source of Specimen:  right and left mastectomy flap tissue  DISPOSITION OF SPECIMEN:  PATHOLOGY  COUNTS:  YES  TOURNIQUET:  * No tourniquets in log *  DICTATION: .Dragon Dictation  PLAN OF CARE: Admit for overnight observation  PATIENT DISPOSITION:  PACU - hemodynamically stable.   Delay start of Pharmacological VTE agent (>24hrs) due to surgical blood loss or risk of bleeding: not applicable

## 2020-04-29 NOTE — Progress Notes (Signed)
Assisted nuc med tech with nuc med injections. Side rails up, monitors on throughout procedure. See vital signs in flow sheet. Tolerated Procedure well. 

## 2020-04-29 NOTE — OR Nursing (Signed)
Straight cathed with 14 French latex free catheter for .

## 2020-04-29 NOTE — Anesthesia Procedure Notes (Signed)
Procedure Name: Intubation Date/Time: 04/29/2020 7:49 AM Performed by: Lavonia Dana, CRNA Pre-anesthesia Checklist: Patient identified, Emergency Drugs available, Suction available and Patient being monitored Patient Re-evaluated:Patient Re-evaluated prior to induction Oxygen Delivery Method: Circle system utilized Preoxygenation: Pre-oxygenation with 100% oxygen Induction Type: IV induction Ventilation: Mask ventilation without difficulty Laryngoscope Size: Mac and 3 Grade View: Grade I Tube type: Oral Tube size: 7.0 mm Number of attempts: 1 Airway Equipment and Method: Stylet and Bite block Placement Confirmation: ETT inserted through vocal cords under direct vision,  positive ETCO2 and breath sounds checked- equal and bilateral Secured at: 22 cm Tube secured with: Tape Dental Injury: Teeth and Oropharynx as per pre-operative assessment

## 2020-04-29 NOTE — Op Note (Signed)
Operative Note   DATE OF OPERATION: 04/29/2020  SURGICAL DEPARTMENT: Plastic Surgery  PREOPERATIVE DIAGNOSES: Breast cancer  POSTOPERATIVE DIAGNOSES:  same  PROCEDURE: 1.  Bilateral prepectoral breast reconstruction with tissue expanders and acellular dermal matrix 2.  Indocyanine green angiography of bilateral mastectomy flaps 3.  Incisional wound VAC application to bilateral breast  SURGEON: Ancil Linsey, MD  ASSISTANT: Zadie Cleverly, PA The advanced practice practitioner (APP) assisted throughout the case.  The APP was essential in retraction and counter traction when needed to make the case progress smoothly.  This retraction and assistance made it possible to see the tissue plans for the procedure.  The assistance was needed for blood control, tissue re-approximation and assisted with closure of the incision site.  ANESTHESIA:  General.   COMPLICATIONS: None.   INDICATIONS FOR PROCEDURE:  The patient, Natalie Cardenas is a 45 y.o. female born on Jan 18, 1976, is here for treatment of bilateral mastectomy defects.  She had a right-sided DCIS and underwent bilateral nipple sparing mastectomies. MRN: [id number]  CONSENT:  Informed consent was obtained directly from the patient. Risks, benefits and alternatives were fully discussed. Specific risks including but not limited to bleeding, infection, hematoma, seroma, scarring, pain, contracture, asymmetry, wound healing problems, and need for further surgery were all discussed. The patient did have an ample opportunity to have questions answered to satisfaction.   DESCRIPTION OF PROCEDURE:  The patient was taken to the operating room. SCDs were placed and antibiotics were given.  General anesthesia was administered.  The patient's operative site was prepped and draped in a sterile fashion. A time out was performed and all information was confirmed to be correct.  Dr. Dwain Sarna performed his portion of the case and turned the patient over  to me.  His part will be dictated separately.  I started by evaluating the mastectomy flaps with indocyanine green angiography.  They both ultimately perfused well with the only delayed filling being on the central inferior aspect of the left side.  I elected to proceed with prepectoral reconstructions.  I achieved meticulous hemostasis on both sides.  JP drains were placed on both sides and secured with nylon sutures.  Additionally I tacked down the lateral axillary tissue to close off the dead space laterally on each side with interrupted 3-0 PDS sutures.  The expanders were then brought onto the field and then prepared.  I elected to use Mentor medium height CPX 4 350 cc expanders.  The serial number on the left side is [id number].  The serial number on the right side is [id number].  There were both deflated of all the air within them.  I then brought onto the field 2 pieces of Flex HD pliable preacellular dermal matrix.  I ran a pursestring around the border of each of these with a 3-0 PDS sutures.  The expanders were then placed into the matrix with the pursestring tied down.  I then planned on the chest wall the appropriate location for both expanders and marked the medial, inferior, and lateral areas.  3-0 PDS sutures were placed in each of these locations and then passed through the suture tabs in the expanders.  After copiously irrigating both pockets with triple antibiotic solution the expanders were then placed with the stay sutures tied down to secure them in place.  Both expanders were then filled with 300 cc of injectable saline.  She still had some skin redundancy on both sides and the nipple appeared to be riding high bilaterally.  For  this reason I elected to de-epithelialized portion of the inferior aspect of the mastectomy flap on both sides to slightly decrease the amount of inferior pole skin which was in excess.  This improve the contour and improve the position of the nipple areolar complex.   Closure was done with interrupted buried 3-0 PDS sutures and a running 3 OV lock.  I performed indocyanine green angiography again with the wound closed in the expanders inflated showing good perfusion of both flaps with again a slight delay in the inferior central aspect on the left side but this ultimately seem to fill well.  The Bella wound VAC was then applied on both sides and a good seal was obtained.  Chest was then wrapped in an Ace wrap.  The patient tolerated the procedure well.  There were no complications. The patient was allowed to wake from anesthesia, extubated and taken to the recovery room in satisfactory condition.

## 2020-04-29 NOTE — Transfer of Care (Signed)
Immediate Anesthesia Transfer of Care Note  Patient: Natalie Cardenas  Procedure(s) Performed: BILATERAL NIPPLE SPARING MASTECTOMIES WITH RIGHT AXILLARY SENTINEL LYMPH NODE BIOPSY (Bilateral Breast) IMMEDIATE BILATERAL BREAST RECONSTRUCTION WITH PLACEMENT OF TISSUE EXPANDER AND FLEX HD (ACELLULAR HYDRATED DERMIS) (Bilateral Breast)  Patient Location: PACU  Anesthesia Type:GA combined with regional for post-op pain  Level of Consciousness: drowsy  Airway & Oxygen Therapy: Patient Spontanous Breathing and Patient connected to face mask oxygen  Post-op Assessment: Report given to RN and Post -op Vital signs reviewed and stable  Post vital signs: Reviewed and stable  Last Vitals:  Vitals Value Taken Time  BP 114/64 04/29/20 1138  Temp    Pulse 77 04/29/20 1140  Resp 14 04/29/20 1140  SpO2 99 % 04/29/20 1140  Vitals shown include unvalidated device data.  Last Pain:  Vitals:   04/29/20 0654  TempSrc: Oral  PainSc: 4       Patients Stated Pain Goal: 5 (04/29/20 0654)  Complications: No complications documented.

## 2020-04-30 DIAGNOSIS — D0511 Intraductal carcinoma in situ of right breast: Secondary | ICD-10-CM | POA: Diagnosis not present

## 2020-04-30 MED ORDER — METHOCARBAMOL 500 MG PO TABS: 500.0000 mg | ORAL_TABLET | Freq: Two times a day (BID) | ORAL | 0 refills | Status: AC | PRN

## 2020-04-30 NOTE — Discharge Summary (Signed)
Physician Discharge Summary  Patient ID: Natalie Cardenas MRN: [id number] DOB/AGE: 11-30-1975 45 y.o.  Admit date: 04/29/2020 Discharge date: 04/30/2020  Admission Diagnoses: DCIS right  Discharge Diagnoses:  Active Problems:   Breast cancer [facility])   Discharged Condition: good  Hospital Course: 51 yof s/p bilateral nsm, right ax sn biopsy followed by expander reconstruction. Doing well following am, ready for dc  Consults: None  Significant Diagnostic Studies: none  Treatments: surgery: bilateral nsm, right ax sn biopsy  Discharge Exam: Blood pressure 98/61, pulse (!) 110, temperature (!) 97.4 F (36.3 C), resp. rate 18, height 5\' 5"  (1.651 m), weight 64.5 kg, last menstrual period 04/01/2020, SpO2 97 %. incisional wound vac, no hematoma, drains as expected  Disposition:      Follow-up Information    14/10/2019, MD In 2 weeks.   Specialty: General Surgery Contact information: [address] [place] [phone]        [phone], MD Follow up.   Specialty: Plastic Surgery Contact information: [address] [place] [address] [place] [phone]               Signed: [phone] 04/30/2020, 8:11 AM

## 2020-05-01 ENCOUNTER — Encounter (HOSPITAL_BASED_OUTPATIENT_CLINIC_OR_DEPARTMENT_OTHER): Payer: Self-pay | Admitting: General Surgery

## 2020-05-02 LAB — SURGICAL PATHOLOGY

## 2020-05-05 ENCOUNTER — Telehealth: Payer: Self-pay | Admitting: Plastic Surgery

## 2020-05-05 ENCOUNTER — Encounter: Payer: Self-pay | Admitting: *Deleted

## 2020-05-05 ENCOUNTER — Ambulatory Visit: Payer: Managed Care, Other (non HMO) | Admitting: Hematology

## 2020-05-05 NOTE — Telephone Encounter (Signed)
Patient would like to know if she can shower yet? She had surgery last Tuesday. Should she leave the Ace bandage on in the shower or can she take it off to shower? She feels uncomfortable like a cement cinder block is on her chest. She doesn't feel pain, just discomfort. She feels like it's rigid and poking her in her ribs. Can it be loosened? Please call to advise.

## 2020-05-05 NOTE — Progress Notes (Signed)
[facility]   Telephone:([phone] [phone]) [phone]   Clinic Follow up Note   Patient Care Team: Scherrie Bateman as PCP - General (Family Medicine) Mauro Kaufmann, RN as Oncology Nurse Navigator Rockwell Germany, RN as Oncology Nurse Navigator Truitt Merle, MD as Consulting Physician (Hematology) Rolm Bookbinder, MD as Consulting Physician (General Surgery)  Date of Service:  05/07/2020  CHIEF COMPLAINT: F/u of right breast DCIS  SUMMARY OF ONCOLOGIC HISTORY: Oncology History Overview Note  Cancer Staging Ductal carcinoma in situ (DCIS) of right breast Staging form: Breast, AJCC 8th Edition - Clinical stage from 12/28/2019: Stage 0 (cTis ([place]ag[place]), cN0, cM0, GX, ER+, PR+, HER2: Not Assessed) - Signed by Truitt Merle, MD on 01/02/2020    Ductal carcinoma in situ (DCIS) of right breast  12/14/2019 Mammogram   IMPRESSION: Suspicious calcifications in the OUTER RETROAREOLAR RIGHT breast spanning a distance of 3.5 cm. Tissue sampling is recommended.   12/28/2019 Cancer Staging   Staging form: Breast, AJCC 8th Edition - Clinical stage from 12/28/2019: Stage 0 (cTis (Paget), cN0, cM0, GX, ER+, PR+, HER2: Not Assessed) - Signed by Truitt Merle, MD on 01/02/2020   12/28/2019 Initial Biopsy   Diagnosis 1. Breast, right, needle core biopsy, lateral, anterior - DUCTAL CARCINOMA IN SITU, HIGH-GRADE WITH FOCAL NECROSIS AND CALCIFICATIONS. SEE NOTE 2. Breast, right, needle core biopsy, 12 o'clock, retroareolar - DUCTAL CARCINOMA IN SITU, HIGH-GRADE WITH FOCAL NECROSIS AND CALCIFICATIONS. SEE NOTE   Diagnosis Note 1. and 2. DCIS measures 0.4 cm in part 1 and 0.2 cm in part 2 in greatest linear dimension. Dr. Tresa Moore reviewed the case and concurs with the diagnosis. A breast prognostic profile (ER, PR) is pending and will be reported in an addendum. [facility] was notified on 01/01/2020.   12/28/2019 Receptors her2   1. PROGNOSTIC  INDICATORS Results: IMMUNOHISTOCHEMICAL AND MORPHOMETRIC ANALYSIS PERFORMED MANUALLY Estrogen Receptor: 95%, POSITIVE, STRONG STAINING INTENSITY Progesterone Receptor: 70%, POSITIVE, STRONG STAINING INTENSITY   01/02/2020 Initial Diagnosis   Ductal carcinoma in situ (DCIS) of right breast   01/11/2020 Breast MRI   IMPRESSION: 1. Biopsy-proven DCIS involving the subareolar location and LOWER OUTER QUADRANT of the RIGHT breast with maximum craniocaudal measurement of 3.8 cm. The non-mass enhancement extends to just behind the nipple. 2. Indeterminate non-mass enhancement involving the LOWER OUTER QUADRANT of the LEFT breast at MIDDLE to POSTERIOR depth spanning 4.0 cm. 3. No pathologic lymphadenopathy.   01/14/2020 Genetic Testing   Negative genetic testing.  KIT c.1879C>T (p.Pro627Ser) and PDGFRA c.1436G>A (p.Arg479Gln) VUS identified on the Multi-cancer panel.  The Multi-Gene Panel offered by [facility] includes sequencing and/or deletion duplication testing of the following 85 genes: AIP, ALK, APC, ATM, AXIN2,BAP1,  BARD1, BLM, BMPR1A, BRCA1, BRCA2, BRIP1, CASR, CDC73, CDH1, CDK4, CDKN1B, CDKN1C, CDKN2A (p14ARF), CDKN2A (p16INK4a), CEBPA, CHEK2, CTNNA1, DICER1, DIS3L2, EGFR (c.2369C>T, p.Thr790Met variant only), EPCAM (Deletion/duplication testing only), FH, FLCN, GATA2, GPC3, GREM1 (Promoter region deletion/duplication testing only), HOXB13 (c.251G>A, p.Gly84Glu), HRAS, KIT, MAX, MEN1, MET, MITF (c.952G>A, p.Glu318Lys variant only), MLH1, MSH2, MSH3, MSH6, MUTYH, NBN, NF1, NF2, NTHL1, PALB2, PDGFRA, PHOX2B, PMS2, POLD1, POLE, POT1, PRKAR1A, PTCH1, PTEN, RAD50, RAD51C, RAD51D, RB1, RECQL4, RET, RNF43, RUNX1, SDHAF2, SDHA (sequence changes only), SDHB, SDHC, SDHD, SMAD4, SMARCA4, SMARCB1, SMARCE1, STK11, SUFU, TERC, TERT, TMEM127, TP53, TSC1, TSC2, VHL, WRN and WT1.  The report date is January 14, 2020.   02/01/2020 Pathology Results   Diagnosis Breast, left, needle core biopsy, lower outer -  FIBROADENOMATOID CHANGES,  DUCT ECTASIA, AND FIBROCYSTIC CHANGES WITH APOCRINE METAPLASIA - NO MALIGNANCY IDENTIFIED Microscopic Comment These results were called to [facility] on February 04, 2020.   02/08/2020 - 02/2020 Anti-estrogen oral therapy   She was interested in chemoprevention with Tamoxifen and took this for 3 weeks before surgery but stopped due to poor toleration. Given b/l mastectomy for DCIS she does not need antiestrogen therapy.    04/29/2020 Surgery   BILATERAL NIPPLE SPARING MASTECTOMIES WITH RIGHT AXILLARY SENTINEL LYMPH NODE BIOPSY bu Dr Donne Hazel    IMMEDIATE BILATERAL BREAST RECONSTRUCTION WITH PLACEMENT OF TISSUE EXPANDER AND FLEX HD (ACELLULAR HYDRATED DERMIS) by Dr Claudia Desanctis    04/29/2020 Pathology Results   FINAL MICROSCOPIC DIAGNOSIS:   A. BREAST, LEFT, MASTECTOMY:  -  Benign breast tissue with duct ectasia, fibroadenomatoid and  fibrocystic changes  -  No malignancy identified   B. BREAST, LEFT NIPPLE, BIOPSY:  -  Benign breast tissue with duct ectasia  -  No malignancy identified   C. BREAST, RIGHT, MASTECTOMY:  -  Ductal carcinoma in situ, high grade, 0.8cm  -  Margins uninvolved by carcinoma (<0.1 cm; anterior margin)  -  Fibrocystic changes and duct ectasia with periductular chronic  inflammation  -  Previous biopsy site changes  -  See oncology table below   D. BREAST, RIGHT NIPPLE, BIOPSY:  -  Benign breast tissue  -  No malignancy identified   E. LYMPH NODE, RIGHT AXILLARY, SENTINEL EXCISION:  -  No carcinoma identified in one lymph node (0/1)   F. SKIN, LEFT MASTECTOMY FLAP, EXCISION:  -  Benign skin  -  No malignancy identified   G. SKIN, RIGHT MASTECTOMY FLAP, EXCISION:  -  Benign skin  -  No malignancy identified       CURRENT THERAPY:  Surveillance   INTERVAL HISTORY:  Natalie Cardenas is here for a follow up. She presents to the clinic with husband. She notes she underwent b/l mastectomy and has expanders in place.  She plans to have reconstruction surgery later. She notes she has intermittently down days with right breast shooting pain, nausea and fatigue. She will have numbness in her right posterior shoulder. She notes she has temporary skin rash recently but this is resolving. She has tylenol for pain but will take half oxycodone at night as needed. She was given muscle relaxer but has ran out. She will refill with surgeon. She notes she will have expanders increased today. She notes these are uncomfortable to her chest.     REVIEW OF SYSTEMS:   Constitutional: Denies fevers, chills or abnormal weight loss Eyes: Denies blurriness of vision Ears, nose, mouth, throat, and face: Denies mucositis or sore throat Respiratory: Denies cough, dyspnea or wheezes Cardiovascular: Denies palpitation, chest discomfort or lower extremity swelling Gastrointestinal:  Denies nausea, heartburn or change in bowel habits Skin: (+) Resolving skin rash on right thigh  Lymphatics: Denies new lymphadenopathy or easy bruising Neurological:Denies numbness, tingling or new weaknesses Behavioral/Psych: Mood is stable, no new changes  All other systems were reviewed with the patient and are negative.  MEDICAL HISTORY:  Past Medical History:  Diagnosis Date  . Breast cancer ([place])   . Diabetes mellitus without complication ([place])   . Thyroid nodule     SURGICAL HISTORY: Past Surgical History:  Procedure Laterality Date  . BREAST RECONSTRUCTION WITH PLACEMENT OF TISSUE EXPANDER AND FLEX HD (ACELLULAR HYDRATED DERMIS) Bilateral 04/29/2020   Procedure: IMMEDIATE BILATERAL BREAST RECONSTRUCTION WITH PLACEMENT OF TISSUE EXPANDER  AND FLEX HD (ACELLULAR HYDRATED DERMIS);  Surgeon: [name], MD;  Location: [facility];  Service: Plastics;  Laterality: Bilateral;  . NIPPLE SPARING MASTECTOMY WITH SENTINEL LYMPH NODE BIOPSY Bilateral 04/29/2020   Procedure: BILATERAL NIPPLE SPARING MASTECTOMIES WITH RIGHT AXILLARY  SENTINEL LYMPH NODE BIOPSY;  Surgeon: Rolm Bookbinder, MD;  Location: [facility];  Service: General;  Laterality: Bilateral;  BILATERAL PEC BLOCK, RNFA  . TONSILLECTOMY      I have reviewed the social history and family history with the patient and they are unchanged from previous note.  ALLERGIES:  has No Known Allergies.  MEDICATIONS:  Current Outpatient Medications  Medication Sig Dispense Refill  . acarbose (PRECOSE) 25 MG tablet Take by mouth daily.     Marland Kitchen albuterol (VENTOLIN HFA) 108 (90 Base) MCG/ACT inhaler SMARTSIG:1-2 Puff(s) By Mouth Every 4 Hours PRN    . ASMANEX HFA 200 MCG/ACT AERO SMARTSIG:1 Puff(s) Via Inhaler Morning-Evening    . clonazePAM (KLONOPIN) 0.5 MG tablet Take 1 tablet (0.5 mg total) by mouth 2 (two) times daily as needed. 45 tablet 0  . diphenhydrAMINE (BENADRYL) 25 MG tablet Take 25 mg by mouth at bedtime as needed.    . ferrous sulfate 325 (65 FE) MG tablet Take by mouth.    . methocarbamol (ROBAXIN) 500 MG tablet Take 1 tablet (500 mg total) by mouth every 6 (six) hours as needed for muscle spasms. 25 tablet 0  . Prenatal Vit-Fe Fumarate-FA (PRENATAL VITAMIN PO) Take by mouth.    . Probiotic Product (PROBIOTIC DAILY PO) Take by mouth.    . sennosides-docusate sodium (SENOKOT-S) 8.6-50 MG tablet Take 3 tablets by mouth at bedtime.    . tamoxifen (NOLVADEX) 10 MG tablet     . UNABLE TO FIND daily. Med Name: vitamin b 3     No current facility-administered medications for this visit.    PHYSICAL EXAMINATION: ECOG PERFORMANCE STATUS: 1 - Symptomatic but completely ambulatory  Vitals:   05/07/20 1112  BP: 128/69  Pulse: 86  Resp: 16  Temp: (!) 96.9 F (36.1 C)  SpO2: 100%   Filed Weights   05/07/20 1112  Weight: 144 lb 4.8 oz (65.5 kg)    Due to COVID19 we will limit examination to appearance. Patient had no complaints.  GENERAL:alert, no distress and comfortable SKIN: skin color normal, no rashes or significant lesions EYES:  normal, Conjunctiva are pink and non-injected, sclera clear  NEURO: alert & oriented x 3 with fluent speech Breast exam deferred, she will see Dr. Claudia Desanctis today    LABORATORY DATA:  I have reviewed the data as listed CBC Latest Ref Rng & Units 01/07/2020  WBC 4.0 - 10.5 K/uL 7.1  Hemoglobin 12.0 - 15.0 g/dL 11.1(L)  Hematocrit 36.0 - 46.0 % 34.3(L)  Platelets 150 - 400 K/uL 222     CMP Latest Ref Rng & Units 04/24/2020 01/07/2020  Glucose 70 - 99 mg/dL 102(H) 99  BUN 6 - 20 mg/dL 14 15  Creatinine 0.44 - 1.00 mg/dL 0.59 0.73  Sodium 135 - 145 mmol/L 139 142  Potassium 3.5 - 5.1 mmol/L 4.2 3.8  Chloride 98 - 111 mmol/L 105 108  CO2 22 - 32 mmol/L 25 27  Calcium 8.9 - 10.3 mg/dL 9.3 9.2  Total Protein 6.5 - 8.1 g/dL - 6.6  Total Bilirubin 0.3 - 1.2 mg/dL - 0.5  Alkaline Phos 38 - 126 U/L - 86  AST 15 - 41 U/L - 16  ALT 0 -  44 U/L - 15      RADIOGRAPHIC STUDIES: I have personally reviewed the radiological images as listed and agreed with the findings in the report. No results found.   ASSESSMENT & PLAN:  Natalie Cardenas is a 45 y.o. female with    1. Right breast DCIS, high grade, ER+PR+  -She was diagnosed in 12/2019.  -She underwent b/l mastectomy on 04/29/20 with Dr Donne Hazel and Dr Claudia Desanctis. She has tissue expanders in place currently and plans to have further reconstruction surgery with Dr Claudia Desanctis in 2022.  -Her surgical path showed complete resection of 0.8cm high grade DCIS in right breast, clear margins, LN negative. I reviewed with patient today. Left breat was benign -She is recovering well, but notes discomfort form tissue expander and right breast surgical incision -She was interested in chemoprevention with Tamoxifen and took this for 3 weeks before surgery but.  stopped due to poor toleration.  -Her DCIS was cured by complete surgical resection. Given she is post-mastectomy her risk of future breast cancer is less than 5%. She does not require radiation and there is no benefit  of more antiestrogen therapy.  -Given she had b/l mastectomy, she does not require yearly mammograms or MRI for breast cancer screening. She can continue follow up with her surgeon or PCP.  -F/u with me as needed in the future.  -all questions were answered   Plan -No adjuvant therapy or breast cancer surveilliance needed -F/u with me as needed in the future    No problem-specific Assessment & Plan notes found for this encounter.   No orders of the defined types were placed in this encounter.  All questions were answered. The patient knows to call the clinic with any problems, questions or concerns. No barriers to learning was detected. The total time spent in the appointment was 25 minutes.     Truitt Merle, MD 05/07/2020   I, Joslyn Devon, am acting as scribe for Truitt Merle, MD.   I have reviewed the above documentation for accuracy and completeness, and I agree with the above.

## 2020-05-06 NOTE — Telephone Encounter (Signed)
Called and spoke with the patient on (05/05/20) regarding the message below.  Patient stated that she was told don't shower for 3 days.  Informed the patient that yes she can go ahead and shower, and she's to take the ace wrap off and the gauze.  The tape is to be left on.  Informed her to place soap on her shoulders and let the water run down on her.  She can place the drains to the side while the water runs down.  Patient stated that she have something to place the drains in.     Informed her the discomfort she's feeling is coming from the expanders.  The expanders are hard.  The patient verbalized understanding and agreed.     Patient also asked about the Methocarbamol and getting a refill for it.  I informed her that it was a muscle relaxer and she will take it mainly after getting fillers.  Patient stated that she will speak with Matthew,PA-C at her next appointment.//AB/CMA

## 2020-05-07 ENCOUNTER — Inpatient Hospital Stay: Payer: Managed Care, Other (non HMO) | Attending: Hematology | Admitting: Hematology

## 2020-05-07 ENCOUNTER — Other Ambulatory Visit: Payer: Self-pay

## 2020-05-07 ENCOUNTER — Encounter: Payer: Managed Care, Other (non HMO) | Admitting: Plastic Surgery

## 2020-05-07 ENCOUNTER — Encounter: Payer: Self-pay | Admitting: Hematology

## 2020-05-07 ENCOUNTER — Encounter: Payer: Self-pay | Admitting: Plastic Surgery

## 2020-05-07 ENCOUNTER — Ambulatory Visit (INDEPENDENT_AMBULATORY_CARE_PROVIDER_SITE_OTHER): Payer: Managed Care, Other (non HMO) | Admitting: Plastic Surgery

## 2020-05-07 VITALS — BP 128/69 | HR 86 | Temp 96.9°F | Resp 16 | Ht 65.0 in | Wt 144.3 lb

## 2020-05-07 VITALS — BP 111/69 | HR 76

## 2020-05-07 DIAGNOSIS — Z9013 Acquired absence of bilateral breasts and nipples: Secondary | ICD-10-CM | POA: Insufficient documentation

## 2020-05-07 DIAGNOSIS — Z79899 Other long term (current) drug therapy: Secondary | ICD-10-CM | POA: Insufficient documentation

## 2020-05-07 DIAGNOSIS — R110 Nausea: Secondary | ICD-10-CM | POA: Diagnosis not present

## 2020-05-07 DIAGNOSIS — R5383 Other fatigue: Secondary | ICD-10-CM | POA: Insufficient documentation

## 2020-05-07 DIAGNOSIS — Z170 Estrogen receptor positive status [ER+]: Secondary | ICD-10-CM | POA: Diagnosis not present

## 2020-05-07 DIAGNOSIS — Z9223 Personal history of estrogen therapy: Secondary | ICD-10-CM | POA: Diagnosis not present

## 2020-05-07 DIAGNOSIS — E119 Type 2 diabetes mellitus without complications: Secondary | ICD-10-CM | POA: Diagnosis not present

## 2020-05-07 DIAGNOSIS — Z7951 Long term (current) use of inhaled steroids: Secondary | ICD-10-CM | POA: Diagnosis not present

## 2020-05-07 DIAGNOSIS — D0511 Intraductal carcinoma in situ of right breast: Secondary | ICD-10-CM

## 2020-05-07 MED ORDER — METHOCARBAMOL 500 MG PO TABS: 500.0000 mg | ORAL_TABLET | Freq: Four times a day (QID) | ORAL | 0 refills | Status: DC | PRN

## 2020-05-07 NOTE — Progress Notes (Signed)
Patient presents 1 week postop from bilateral mastectomy and breast reconstruction with tissue expanders and acellular dermal matrix.  She overall feels good and just has intermittent sharp pains and some soreness.  On examination everything looks to be doing well.  The skin overall looks great.  There is a few areas around the right nipple areolar complex that are a bit bruised but they are small and I do not expect to develop into full-thickness injury.  The incisions themselves along the inframammary crease are healing great with no signs of skin compromise in that area.  The drains have put out mostly 20 to 30 cc over the past several days although the one on the right side put out 50 cc yesterday.  I did remove the 1 on the left side and will leave the 1 on the right side for next week more than likely.  I am going to hold off on a fill today but she may be ready to start the filling process next week.  I should note the nipple areolar complexes look to be symmetric and appropriate location relative to the expander.  She has been taking Robaxin and is interested in a refill on that which I think is reasonable.  Otherwise she has not required much hydrocodone.  She is going to continue with her compressive wrap and I took care to mention to her to not make this too tight to not stress the skin too much.  She is fully understanding we will see her again next week.

## 2020-05-08 ENCOUNTER — Encounter: Payer: Self-pay | Admitting: *Deleted

## 2020-05-08 ENCOUNTER — Other Ambulatory Visit: Payer: Self-pay | Admitting: Hematology

## 2020-05-09 ENCOUNTER — Other Ambulatory Visit: Payer: Self-pay | Admitting: Hematology

## 2020-05-09 MED ORDER — CLONAZEPAM 0.5 MG PO TABS: 0.5000 mg | ORAL_TABLET | Freq: Two times a day (BID) | ORAL | 0 refills | Status: DC | PRN

## 2020-05-14 ENCOUNTER — Other Ambulatory Visit: Payer: Self-pay

## 2020-05-14 ENCOUNTER — Telehealth: Payer: Self-pay | Admitting: Plastic Surgery

## 2020-05-14 ENCOUNTER — Ambulatory Visit (INDEPENDENT_AMBULATORY_CARE_PROVIDER_SITE_OTHER): Payer: Managed Care, Other (non HMO) | Admitting: Surgical

## 2020-05-14 ENCOUNTER — Encounter: Payer: Self-pay | Admitting: Surgical

## 2020-05-14 VITALS — BP 114/69 | HR 75

## 2020-05-14 DIAGNOSIS — D0511 Intraductal carcinoma in situ of right breast: Secondary | ICD-10-CM

## 2020-05-14 NOTE — Telephone Encounter (Signed)
Patient wanted to know:   1) she has an area that was bandaged and is no longer covered. Is she able to take a shower with this uncovered or should she cover it while showering. Can she bathe yet? She wasn't sure if she could have the area submerged under water.   2)Can she drive yet?   Please call to advise. [phone]

## 2020-05-14 NOTE — Progress Notes (Signed)
Patient is a 45 year old female here for follow-up after bilateral nipple sparing mastectomy and right axillary sentinel lymph node biopsy by general surgery followed by immediate breast reconstruction with placement of tissue expanders and Flex HD with Dr. Claudia Desanctis on 04/29/2020.  Patient is 2 weeks postop.  Patient had her left JP drain removed last week, she still has a right JP drain in place. She has been having approximately 15 to 20 cc of output daily from the right JP drain. She reports she has had some tenderness of the right greater than left breast.  She also reports some tenderness in her axillary region. She is not having any infectious symptoms.  Chaperone present on exam On exam bilateral breast incisions are intact, she does have a little bit of superficial separation along the left lateral breast incision.  I do not see any exposed ADM or expander.  There is no surrounding erythema.  She does have some superficial scabbing of the right nipple.   We opted not to fill today as patient has some questionable superficial incisional dehiscence on the left side.  She is still having a lot of pain which is bothersome for her.  We removed the right JP drain and she noticed immediate relief in the area of concern.  She is not having any infectious symptoms.  There is no sign of infection, seroma, hematoma.  She currently has 300 cc of injectable saline in each expander. Right:  total of 300 / 350 cc Left: total of 300 / 350 cc  We will plan to fill at her next appointment in 1 to 2 weeks.  Recommend calling with any questions or concerns

## 2020-05-15 NOTE — Telephone Encounter (Signed)
Returned patients call. LMVM. She can take a shower and let soap run down her breast. Do not scrub, take a bath, nor submerge under water until the openings of her incisions have healed completely. She can drive as long as she feels comfortable and not taking any narcotics.

## 2020-05-23 ENCOUNTER — Telehealth: Payer: Self-pay

## 2020-05-23 NOTE — Telephone Encounter (Signed)
Patient called to say that she had surgery with Dr. Claudia Desanctis on 04/29/2020 and she is experiencing constant pain.  She said that it feels tight and burns.  She is wearing a compression bra and she is wondering if it is too tight.  Patient said the only time she feels relief is when she takes a shower.  She said that she can't rest or relax because it's just uncomfortable all the time.  She would like to know if this is normal.  Patient said that she is taking tylenol and ibuprofen and sometimes she will take half of one of the pills they gave her for muscle spasms.  Please call.

## 2020-05-23 NOTE — Telephone Encounter (Signed)
45 year old female status post bilateral breast reconstruction placement of tissue expanders.  She currently has tissue expanders in place and called the office today stating that she is having ongoing pain in her bilateral breasts, right greater than left.  She reports the pain in her right breast is burning and very bothersome to her.  She is curious of some options for treating this.  She reports she has been taking 800 mg of ibuprofen frequently.  She also reports that she has been taking 1000 mg of Tylenol frequently as well.  She reports that she is also taking the Robaxin occasionally.  She reports that she has been taking her Klonopin nightly to help with sleep as she has been having difficulty with sleep due to the pain.  She is not having any other concerning symptoms.  She is just worried about the pain and wonders if this is normal.  She reports in the past she has used gabapentin with shingles, however reports she did not like the way it made her feel and would not like to try that at this time.  I discussed with her she can do 600 mg of ibuprofen every 6 hours as needed, she can do 1000 mg of Tylenol every 8 hours as needed.  I discussed with her to not mix Robaxin and Klonopin as this can cause excess sedation.  She is understanding of this.  She can take the Robaxin throughout the day if she would like, but should avoid taking it in conjunction with Klonopin.  Patient is scheduled for reevaluation next week with our office, I discussed with her that if she has any questions or concerns she can be sure to call us back.  We spent 18 minutes on the phone discussing this today.

## 2020-05-28 ENCOUNTER — Other Ambulatory Visit: Payer: Self-pay

## 2020-05-28 ENCOUNTER — Ambulatory Visit (INDEPENDENT_AMBULATORY_CARE_PROVIDER_SITE_OTHER): Payer: Managed Care, Other (non HMO) | Admitting: Surgical

## 2020-05-28 ENCOUNTER — Encounter: Payer: Self-pay | Admitting: Surgical

## 2020-05-28 VITALS — BP 127/83 | HR 78

## 2020-05-28 DIAGNOSIS — D0511 Intraductal carcinoma in situ of right breast: Secondary | ICD-10-CM

## 2020-05-28 MED ORDER — MELOXICAM 7.5 MG PO TABS: 7.5000 mg | ORAL_TABLET | Freq: Every day | ORAL | 0 refills | Status: DC

## 2020-05-28 NOTE — Progress Notes (Signed)
Patient is a 45 year old female here for follow-up after bilateral nipple sparing mastectomy and right axillary sentinel lymph node biopsy by general surgery followed by immediate breast reconstruction with placement tissue expanders and Flex HD with Dr. Claudia Desanctis on 04/29/2020.  Patient is approximately 4 weeks postop.  She reports ongoing burning pain in the right breast and right axilla.  She reports pain with bending over.  She also reports pain with any excessive movements.  She reports that she had some gabapentin from a previous prescription that she tried, but did not notice much relief.  She has been doing ibuprofen and Tylenol as well which is somewhat helpful.  She is interested in expanding more.   Chaperone present on exam On exam bilateral breast incisions are intact.  She does have a little bit of scabbing on the right NAC.  Bilateral breasts are soft, no swelling noted.  No incisional dehiscence is noted.  No erythema.  We placed injectable saline in the Expander using a sterile technique: Right: 50 cc for a total of 350 / 350 cc Left: 50 cc for a total of 350 / 350 cc  Plan for follow-up in 1 week for possible additional fill.  Patient is near the current size that she would like, we can start planning for exchange of bilateral breast expanders for bilateral breast implants.  I recommend she call with any questions or concerns.  We will send a prescription for meloxicam to help with pain as patient reports minimal relief with gabapentin and possible history of shortness of breath with gabapentin

## 2020-05-29 ENCOUNTER — Ambulatory Visit: Payer: Managed Care, Other (non HMO) | Admitting: Surgical

## 2020-05-29 ENCOUNTER — Telehealth: Payer: Self-pay

## 2020-05-29 NOTE — Telephone Encounter (Signed)
Patient saw Roetta Sessions, PA-C, in the office yesterday and they added fluid to the expanders which actually helped a lot with the discomfort.  He gave her a medication and she was up all night and all of today with body aches.  She doesn't know if it's from the medication or if something is starting.  She would like to hear Matt's thoughts.  Please call.

## 2020-05-30 ENCOUNTER — Telehealth: Payer: Self-pay | Admitting: Surgical

## 2020-05-30 NOTE — Telephone Encounter (Signed)
Called patient to check in on her status, no answer.  Did not leave voicemail message.  We are scheduled to see the patient next week for reevaluation.

## 2020-06-02 NOTE — Telephone Encounter (Signed)
Called and spoke with the patient on (05/29/20) regarding the message below.  Informed the patient that I spoke with [facility] and he stated that the Meloxicam should not disrupt sleep.  It's just a strong Ibuprofen.  He stated that if she things it's coming from the medicine she can stop taking it.    Matthew,PA-C stated that based on the day he saw her he didn't see anything worsen.    Patient stated that her breast feels a little better.  She said last night my body hurt and she had pelvic pain.  She went and had a COVID test which was negative.     This morning she took Tylenol.  She don't have chills or achy, but she's getting nauseated.   She got in hot showers.  She stated her (R) breast is red and hot.    Informed the patient that [facility] said she can call her PCP about the symptoms.  She stated that she's not having any fever or chills now.  Matthew,PA-C also said she can send pictures, but there was no infection in her breasts when he saw her.  Patient verbalized understanding and agreed.//AB/CMA

## 2020-06-05 ENCOUNTER — Other Ambulatory Visit: Payer: Self-pay

## 2020-06-05 ENCOUNTER — Ambulatory Visit (INDEPENDENT_AMBULATORY_CARE_PROVIDER_SITE_OTHER): Payer: Managed Care, Other (non HMO) | Admitting: Surgical

## 2020-06-05 ENCOUNTER — Encounter: Payer: Self-pay | Admitting: Surgical

## 2020-06-05 VITALS — BP 124/74 | HR 84

## 2020-06-05 DIAGNOSIS — D0511 Intraductal carcinoma in situ of right breast: Secondary | ICD-10-CM

## 2020-06-05 NOTE — Progress Notes (Signed)
Patient is a 45 year old female here for follow-up after bilateral nipple sparing mastectomy and right axillary sentinel lymph node biopsy by general surgery followed by immediate breast reconstruction with placement of tissue expanders and Flex HD with Dr. Claudia Desanctis on 04/29/2020.  Patient is approximately 5 weeks postop. She currently has 350 cc in her bilateral tissue expanders.  Patient reports she noticed full body aching and "flulike body aching" after taking the meloxicam after her last visit.  She also reports that she noticed some redness of her right breast.  She also reports that she feels as if she had some chills, but no measurable fevers.  She reports she did have a Covid test and reports this was negative.  She reports that pain in her right breast has been improving.  She does report that she is comfortable with her current size of her bilateral breasts, but would like an additional fill if possible.  She reports that she has been taking gabapentin occasionally, reports she feels as if this has been helpful.  She reports that she is supposed to return to work at the end of February, however after discussing possibility for exchange in the next few weeks she is going to discuss with her job if she can remain out of work until after the additional surgery.  Chaperone present on exam On exam bilateral breast incisions are intact, bilateral NAC's are viable with good color.  There is no cellulitic changes or concern for infection of either breast.  I do not palpate any fluid collections within each breast cavity. She does still have an area of the right NAC that is a small wound, it appears to be improved. On exam she has some volume loss of the superior poles of bilateral breasts, left greater than right.  Bilateral inframammary fold incisions are intact.   Patient's incisions are well-healed, discussed with the patient that I do not see any sign of infection, seroma or hematoma.  I am unsure what  is causing her flulike body aching and chills, at this time I do not feel as if it is associated with her bilateral breast reconstruction.  I discussed the patient that I will discuss her case with Dr. Claudia Desanctis and plan for exchange in the next few weeks, we discussed scheduling a preop appointment prior to surgery to further discuss the surgical plans which patient is agreeable to.  She is comfortable with the current size of the expanders.  We discussed that filling the expanders with much more fluid would put her at an increased risk of significant ptosis in the future as well as increased complications due to the tightness of her bilateral mastectomy flaps.  Patient is understanding of this and comfortable with moving forward with exchange.  We did not place injectable saline in the Expander using a sterile technique: Right: 0 cc for a total of 350 / 350 cc Left: 0 cc for a total of 350 / 350 cc  Pictures were obtained of the patient and placed in the chart with the patient's or guardian's permission.

## 2020-06-16 ENCOUNTER — Encounter (HOSPITAL_BASED_OUTPATIENT_CLINIC_OR_DEPARTMENT_OTHER): Payer: Self-pay | Admitting: Plastic Surgery

## 2020-06-17 ENCOUNTER — Encounter (HOSPITAL_BASED_OUTPATIENT_CLINIC_OR_DEPARTMENT_OTHER): Payer: Self-pay | Admitting: Plastic Surgery

## 2020-06-17 ENCOUNTER — Other Ambulatory Visit: Payer: Self-pay

## 2020-06-18 ENCOUNTER — Other Ambulatory Visit: Payer: Self-pay

## 2020-06-18 ENCOUNTER — Encounter (HOSPITAL_BASED_OUTPATIENT_CLINIC_OR_DEPARTMENT_OTHER)
Admission: RE | Admit: 2020-06-18 | Discharge: 2020-06-18 | Disposition: A | Discharge status: Home / Self Care | Payer: Managed Care, Other (non HMO) | Source: Ambulatory Visit | Attending: Plastic Surgery | Admitting: Plastic Surgery

## 2020-06-18 ENCOUNTER — Ambulatory Visit (INDEPENDENT_AMBULATORY_CARE_PROVIDER_SITE_OTHER): Payer: Managed Care, Other (non HMO) | Admitting: Surgical

## 2020-06-18 ENCOUNTER — Encounter: Payer: Self-pay | Admitting: Surgical

## 2020-06-18 VITALS — BP 119/81 | HR 78 | Ht 65.0 in | Wt 146.0 lb

## 2020-06-18 DIAGNOSIS — D0511 Intraductal carcinoma in situ of right breast: Secondary | ICD-10-CM

## 2020-06-18 DIAGNOSIS — Z01812 Encounter for preprocedural laboratory examination: Secondary | ICD-10-CM | POA: Diagnosis not present

## 2020-06-18 LAB — POCT PREGNANCY, URINE: Preg Test, Ur: NEGATIVE

## 2020-06-18 MED ORDER — METHOCARBAMOL 500 MG PO TABS: 500.0000 mg | ORAL_TABLET | Freq: Four times a day (QID) | ORAL | 0 refills | Status: DC | PRN

## 2020-06-18 MED ORDER — HYDROCODONE-ACETAMINOPHEN 5-325 MG PO TABS: 1.0000 | ORAL_TABLET | Freq: Four times a day (QID) | ORAL | 0 refills | Status: AC | PRN

## 2020-06-18 MED ORDER — ONDANSETRON HCL 4 MG PO TABS: 4.0000 mg | ORAL_TABLET | Freq: Three times a day (TID) | ORAL | 0 refills | Status: DC | PRN

## 2020-06-18 NOTE — Progress Notes (Signed)
Patient ID: Natalie Cardenas, female    DOB: 1975/08/14, 45 y.o.   MRN: [id number]  Chief Complaint  Patient presents with  . Pre-op Exam      ICD-10-CM   1. Ductal carcinoma in situ (DCIS) of right breast  D05.11     History of Present Illness: Natalie Cardenas is a 45 y.o.  female  with a history of bilateral nipple sparing mastectomy and right axillary sentinel lymph node biopsy on 04/29/2020.  She presents for preoperative evaluation for upcoming procedure, removal of bilateral tissue expanders with placement of bilateral breast implants, scheduled for 06/23/2020 with Dr. Claudia Desanctis.  The patient has not had problems with anesthesia. No history of DVT/PE.  No family history of DVT/PE.  No family or personal history of bleeding or clotting disorders.  Patient is not currently taking any blood thinners.  No history of CVA/MI.   Patient currently has a total of 350 cc out of 350 cc in her right expander.  She also has 350 out of 350 cc in her left expander.  She has some asymmetry with the left being slightly smaller in appearance.  Job: Copywriter, advertising  PMH Significant for: Anxiety, diabetes mellitus without complication, asthma-seasonal allergy triggered.  **Patient reports she likes the size and shape of the right breast, but feels as if the left breast shape is more compacted.  She reports that she is overall been doing well, she reports she has had some trouble sleeping over the past few days, but reports her bilateral breast pain has been significantly better.  Past Medical History: Allergies: Allergies  Allergen Reactions  . Other Rash    Chlorahexadine wipe/soap    Current Medications:  Current Outpatient Medications:  .  acetaminophen (TYLENOL) 500 MG tablet, Take 500 mg by mouth every 6 (six) hours as needed., Disp: , Rfl:  .  ibuprofen (ADVIL) 600 MG tablet, Take 600 mg by mouth every 6 (six) hours as needed., Disp: , Rfl:  .  acarbose (PRECOSE) 25 MG tablet, Take by mouth daily.   (Patient not taking: Reported on 06/18/2020), Disp: , Rfl:  .  albuterol (VENTOLIN HFA) 108 (90 Base) MCG/ACT inhaler, SMARTSIG:1-2 Puff(s) By Mouth Every 4 Hours PRN (Patient not taking: Reported on 06/18/2020), Disp: , Rfl:  .  ASMANEX HFA 200 MCG/ACT AERO, SMARTSIG:1 Puff(s) Via Inhaler Morning-Evening (Patient not taking: Reported on 06/18/2020), Disp: , Rfl:  .  clonazePAM (KLONOPIN) 0.5 MG tablet, Take 1 tablet (0.5 mg total) by mouth 2 (two) times daily as needed. (Patient not taking: Reported on 06/18/2020), Disp: 30 tablet, Rfl: 0 .  diphenhydrAMINE (BENADRYL) 25 MG tablet, Take 25 mg by mouth at bedtime as needed. (Patient not taking: Reported on 06/18/2020), Disp: , Rfl:  .  ferrous sulfate 325 (65 FE) MG tablet, Take by mouth. (Patient not taking: Reported on 06/18/2020), Disp: , Rfl:  .  methocarbamol (ROBAXIN) 500 MG tablet, Take 1 tablet (500 mg total) by mouth every 6 (six) hours as needed for muscle spasms. (Patient not taking: Reported on 06/18/2020), Disp: 25 tablet, Rfl: 0 .  Prenatal Vit-Fe Fumarate-FA (PRENATAL VITAMIN PO), Take by mouth. (Patient not taking: Reported on 06/18/2020), Disp: , Rfl:  .  Probiotic Product (PROBIOTIC DAILY PO), Take by mouth. (Patient not taking: Reported on 06/18/2020), Disp: , Rfl:  .  sennosides-docusate sodium (SENOKOT-S) 8.6-50 MG tablet, Take 3 tablets by mouth at bedtime. (Patient not taking: Reported on 06/18/2020), Disp: , Rfl:  .  UNABLE TO  FIND, daily. Med Name: vitamin b 3 (Patient not taking: Reported on 06/18/2020), Disp: , Rfl:   Past Medical Problems: Past Medical History:  Diagnosis Date  . Anxiety   . Breast cancer ([place])   . Depression   . Diabetes mellitus without complication ([place])   . Thyroid nodule     Past Surgical History: Past Surgical History:  Procedure Laterality Date  . BREAST RECONSTRUCTION WITH PLACEMENT OF TISSUE EXPANDER AND FLEX HD (ACELLULAR HYDRATED DERMIS) Bilateral 04/29/2020   Procedure: IMMEDIATE BILATERAL BREAST  RECONSTRUCTION WITH PLACEMENT OF TISSUE EXPANDER AND FLEX HD (ACELLULAR HYDRATED DERMIS);  Surgeon: [name], MD;  Location: [place];  Service: Plastics;  Laterality: Bilateral;  . NIPPLE SPARING MASTECTOMY WITH SENTINEL LYMPH NODE BIOPSY Bilateral 04/29/2020   Procedure: BILATERAL NIPPLE SPARING MASTECTOMIES WITH RIGHT AXILLARY SENTINEL LYMPH NODE BIOPSY;  Surgeon: Rolm Bookbinder, MD;  Location: [place];  Service: General;  Laterality: Bilateral;  BILATERAL PEC BLOCK, RNFA  . TONSILLECTOMY      Social History: Social History   Socioeconomic History  . Marital status: Married    Spouse name: Not on file  . Number of children: 4  . Years of education: Not on file  . Highest education level: Not on file  Occupational History  . Not on file  Tobacco Use  . Smoking status: Never Smoker  . Smokeless tobacco: Never Used  Vaping Use  . Vaping Use: Never used  Substance and Sexual Activity  . Alcohol use: Yes    Comment: social   . Drug use: Never  . Sexual activity: Yes  Other Topics Concern  . Not on file  Social History Narrative  . Not on file   Social Determinants of Health   Financial Resource Strain: Not on file  Food Insecurity: Not on file  Transportation Needs: Not on file  Physical Activity: Not on file  Stress: Not on file  Social Connections: Not on file  Intimate Partner Violence: Not on file    Family History: Family History  Problem Relation Age of Onset  . Cancer Father 61       esophageal cancer  . Cancer Paternal Grandfather        esophageal cancer   . Brain cancer Paternal Grandmother     Review of Systems: Review of Systems  Constitutional: Negative.   Respiratory: Negative.   Cardiovascular: Negative.   Gastrointestinal: Negative.   Neurological: Negative.     Physical Exam: Vital Signs BP 119/81 (BP Location: Left Arm, Patient Position: Sitting, Cuff Size: Normal)   Pulse 78   Ht 5\' 5"   (1.651 m)   Wt 146 lb (66.2 kg)   SpO2 99%   BMI 24.30 kg/m   Physical Exam Constitutional:      General: Not in acute distress.    Appearance: Normal appearance. Not ill-appearing.  HENT:     Head: Normocephalic and atraumatic.  Eyes:     Pupils: Pupils are equal, round Neck:     Musculoskeletal: Normal range of motion.  Cardiovascular:     Rate and Rhythm: Normal rate and regular rhythm.     Pulses: Normal pulses.     Heart sounds: Normal heart sounds. No murmur.  Pulmonary:     Effort: Pulmonary effort is normal. No respiratory distress.     Breath sounds: Normal breath sounds. No wheezing.  Abdominal:     General: Abdomen is flat. There is no distension.     Palpations:  Abdomen is soft.     Tenderness: There is no abdominal tenderness.  Musculoskeletal: Normal range of motion.  Skin:    General: Skin is warm and dry.     Findings: No erythema or rash.  Neurological:     General: No focal deficit present.     Mental Status: Alert and oriented to person, place, and time. Mental status is at baseline.     Motor: No weakness.  Psychiatric:        Mood and Affect: Mood normal.        Behavior: Behavior normal.    Assessment/Plan: The patient is scheduled for exchange of bilateral tissue expanders for bilateral breast implants with Dr. Claudia Desanctis.  Risks, benefits, and alternatives of procedure discussed, questions answered and consent obtained.    Smoking Status: Non-smoker; Counseling Given?  N/A Last Mammogram: MR on 01/11/2020; Results: Right breast DCIS, left breast indeterminate non-mass enhancement: Patient is status post bilateral mastectomy.  Caprini Score: High, 5; Risk Factors include: Age, breast cancer and length of planned surgery. Recommendation for mechanical and 7 to 10 days pharmacological prophylaxis. Encourage early ambulation.  Despite guidelines, patient is very active and do not feel as if postoperative pharmacological prophylaxis is necessary.  We  recommend mechanical prophylaxis and early ambulation.  Pictures obtained: At previous visit on 06/05/2020  Post-op Rx sent to pharmacy: Robaxin, Norco, Zofran  Patient was provided with the General Surgical Risk consent document and Pain Medication Agreement prior to their appointment.  They had adequate time to read through the risk consent documents and Pain Medication Agreement. We also discussed them in person together during this preop appointment. All of their questions were answered to their satisfaction.  Recommended calling if they have any further questions.  Risk consent form and Pain Medication Agreement to be scanned into patient's chart.  The risks that can be encountered with and after placement of a breast implant were discussed and include the following but not limited to these: bleeding, infection, delayed healing, anesthesia risks, skin sensation changes, injury to structures including nerves, blood vessels, and muscles which may be temporary or permanent, allergies to tape, suture materials and glues, blood products, topical preparations or injected agents, skin contour irregularities, skin discoloration and swelling, deep vein thrombosis, cardiac and pulmonary complications, pain, which may persist, fluid accumulation, wrinkling of the skin over the implanmt, changes in nipple or breast sensation, implant leakage or rupture, faulty position of the implant, persistent pain, formation of tight scar tissue around the implant (capsular contracture).  We placed injectable saline in the Expander using a sterile technique: Right: 0 cc for a total of 350 / 350 cc Left: 50 cc for a total of 400 / 350 cc   Electronically signed by: Carola Rhine Scheeler, PA-C 06/18/2020 4:02 PM

## 2020-06-18 NOTE — H&P (View-Only) (Signed)
Patient ID: Natalie Cardenas, female    DOB: Jul 06, 1975, 45 y.o.   MRN: [id number]  Chief Complaint  Patient presents with  . Pre-op Exam      ICD-10-CM   1. Ductal carcinoma in situ (DCIS) of right breast  D05.11     History of Present Illness: Natalie Cardenas is a 45 y.o.  female  with a history of bilateral nipple sparing mastectomy and right axillary sentinel lymph node biopsy on 04/29/2020.  She presents for preoperative evaluation for upcoming procedure, removal of bilateral tissue expanders with placement of bilateral breast implants, scheduled for 06/23/2020 with Dr. Claudia Desanctis.  The patient has not had problems with anesthesia. No history of DVT/PE.  No family history of DVT/PE.  No family or personal history of bleeding or clotting disorders.  Patient is not currently taking any blood thinners.  No history of CVA/MI.   Patient currently has a total of 350 cc out of 350 cc in her right expander.  She also has 350 out of 350 cc in her left expander.  She has some asymmetry with the left being slightly smaller in appearance.  Job: Copywriter, advertising  PMH Significant for: Anxiety, diabetes mellitus without complication, asthma-seasonal allergy triggered.  **Patient reports she likes the size and shape of the right breast, but feels as if the left breast shape is more compacted.  She reports that she is overall been doing well, she reports she has had some trouble sleeping over the past few days, but reports her bilateral breast pain has been significantly better.  Past Medical History: Allergies: Allergies  Allergen Reactions  . Other Rash    Chlorahexadine wipe/soap    Current Medications:  Current Outpatient Medications:  .  acetaminophen (TYLENOL) 500 MG tablet, Take 500 mg by mouth every 6 (six) hours as needed., Disp: , Rfl:  .  ibuprofen (ADVIL) 600 MG tablet, Take 600 mg by mouth every 6 (six) hours as needed., Disp: , Rfl:  .  acarbose (PRECOSE) 25 MG tablet, Take by mouth daily.   (Patient not taking: Reported on 06/18/2020), Disp: , Rfl:  .  albuterol (VENTOLIN HFA) 108 (90 Base) MCG/ACT inhaler, SMARTSIG:1-2 Puff(s) By Mouth Every 4 Hours PRN (Patient not taking: Reported on 06/18/2020), Disp: , Rfl:  .  ASMANEX HFA 200 MCG/ACT AERO, SMARTSIG:1 Puff(s) Via Inhaler Morning-Evening (Patient not taking: Reported on 06/18/2020), Disp: , Rfl:  .  clonazePAM (KLONOPIN) 0.5 MG tablet, Take 1 tablet (0.5 mg total) by mouth 2 (two) times daily as needed. (Patient not taking: Reported on 06/18/2020), Disp: 30 tablet, Rfl: 0 .  diphenhydrAMINE (BENADRYL) 25 MG tablet, Take 25 mg by mouth at bedtime as needed. (Patient not taking: Reported on 06/18/2020), Disp: , Rfl:  .  ferrous sulfate 325 (65 FE) MG tablet, Take by mouth. (Patient not taking: Reported on 06/18/2020), Disp: , Rfl:  .  methocarbamol (ROBAXIN) 500 MG tablet, Take 1 tablet (500 mg total) by mouth every 6 (six) hours as needed for muscle spasms. (Patient not taking: Reported on 06/18/2020), Disp: 25 tablet, Rfl: 0 .  Prenatal Vit-Fe Fumarate-FA (PRENATAL VITAMIN PO), Take by mouth. (Patient not taking: Reported on 06/18/2020), Disp: , Rfl:  .  Probiotic Product (PROBIOTIC DAILY PO), Take by mouth. (Patient not taking: Reported on 06/18/2020), Disp: , Rfl:  .  sennosides-docusate sodium (SENOKOT-S) 8.6-50 MG tablet, Take 3 tablets by mouth at bedtime. (Patient not taking: Reported on 06/18/2020), Disp: , Rfl:  .  UNABLE TO  FIND, daily. Med Name: vitamin b 3 (Patient not taking: Reported on 06/18/2020), Disp: , Rfl:   Past Medical Problems: Past Medical History:  Diagnosis Date  . Anxiety   . Breast cancer ([place])   . Depression   . Diabetes mellitus without complication ([place])   . Thyroid nodule     Past Surgical History: Past Surgical History:  Procedure Laterality Date  . BREAST RECONSTRUCTION WITH PLACEMENT OF TISSUE EXPANDER AND FLEX HD (ACELLULAR HYDRATED DERMIS) Bilateral 04/29/2020   Procedure: IMMEDIATE BILATERAL BREAST  RECONSTRUCTION WITH PLACEMENT OF TISSUE EXPANDER AND FLEX HD (ACELLULAR HYDRATED DERMIS);  Surgeon: [name], MD;  Location: [place];  Service: Plastics;  Laterality: Bilateral;  . NIPPLE SPARING MASTECTOMY WITH SENTINEL LYMPH NODE BIOPSY Bilateral 04/29/2020   Procedure: BILATERAL NIPPLE SPARING MASTECTOMIES WITH RIGHT AXILLARY SENTINEL LYMPH NODE BIOPSY;  Surgeon: Rolm Bookbinder, MD;  Location: [place];  Service: General;  Laterality: Bilateral;  BILATERAL PEC BLOCK, RNFA  . TONSILLECTOMY      Social History: Social History   Socioeconomic History  . Marital status: Married    Spouse name: Not on file  . Number of children: 4  . Years of education: Not on file  . Highest education level: Not on file  Occupational History  . Not on file  Tobacco Use  . Smoking status: Never Smoker  . Smokeless tobacco: Never Used  Vaping Use  . Vaping Use: Never used  Substance and Sexual Activity  . Alcohol use: Yes    Comment: social   . Drug use: Never  . Sexual activity: Yes  Other Topics Concern  . Not on file  Social History Narrative  . Not on file   Social Determinants of Health   Financial Resource Strain: Not on file  Food Insecurity: Not on file  Transportation Needs: Not on file  Physical Activity: Not on file  Stress: Not on file  Social Connections: Not on file  Intimate Partner Violence: Not on file    Family History: Family History  Problem Relation Age of Onset  . Cancer Father 47       esophageal cancer  . Cancer Paternal Grandfather        esophageal cancer   . Brain cancer Paternal Grandmother     Review of Systems: Review of Systems  Constitutional: Negative.   Respiratory: Negative.   Cardiovascular: Negative.   Gastrointestinal: Negative.   Neurological: Negative.     Physical Exam: Vital Signs BP 119/81 (BP Location: Left Arm, Patient Position: Sitting, Cuff Size: Normal)   Pulse 78   Ht 5\' 5"   (1.651 m)   Wt 146 lb (66.2 kg)   SpO2 99%   BMI 24.30 kg/m   Physical Exam Constitutional:      General: Not in acute distress.    Appearance: Normal appearance. Not ill-appearing.  HENT:     Head: Normocephalic and atraumatic.  Eyes:     Pupils: Pupils are equal, round Neck:     Musculoskeletal: Normal range of motion.  Cardiovascular:     Rate and Rhythm: Normal rate and regular rhythm.     Pulses: Normal pulses.     Heart sounds: Normal heart sounds. No murmur.  Pulmonary:     Effort: Pulmonary effort is normal. No respiratory distress.     Breath sounds: Normal breath sounds. No wheezing.  Abdominal:     General: Abdomen is flat. There is no distension.     Palpations:  Abdomen is soft.     Tenderness: There is no abdominal tenderness.  Musculoskeletal: Normal range of motion.  Skin:    General: Skin is warm and dry.     Findings: No erythema or rash.  Neurological:     General: No focal deficit present.     Mental Status: Alert and oriented to person, place, and time. Mental status is at baseline.     Motor: No weakness.  Psychiatric:        Mood and Affect: Mood normal.        Behavior: Behavior normal.    Assessment/Plan: The patient is scheduled for exchange of bilateral tissue expanders for bilateral breast implants with Dr. Claudia Desanctis.  Risks, benefits, and alternatives of procedure discussed, questions answered and consent obtained.    Smoking Status: Non-smoker; Counseling Given?  N/A Last Mammogram: MR on 01/11/2020; Results: Right breast DCIS, left breast indeterminate non-mass enhancement: Patient is status post bilateral mastectomy.  Caprini Score: High, 5; Risk Factors include: Age, breast cancer and length of planned surgery. Recommendation for mechanical and 7 to 10 days pharmacological prophylaxis. Encourage early ambulation.  Despite guidelines, patient is very active and do not feel as if postoperative pharmacological prophylaxis is necessary.  We  recommend mechanical prophylaxis and early ambulation.  Pictures obtained: At previous visit on 06/05/2020  Post-op Rx sent to pharmacy: Robaxin, Norco, Zofran  Patient was provided with the General Surgical Risk consent document and Pain Medication Agreement prior to their appointment.  They had adequate time to read through the risk consent documents and Pain Medication Agreement. We also discussed them in person together during this preop appointment. All of their questions were answered to their satisfaction.  Recommended calling if they have any further questions.  Risk consent form and Pain Medication Agreement to be scanned into patient's chart.  The risks that can be encountered with and after placement of a breast implant were discussed and include the following but not limited to these: bleeding, infection, delayed healing, anesthesia risks, skin sensation changes, injury to structures including nerves, blood vessels, and muscles which may be temporary or permanent, allergies to tape, suture materials and glues, blood products, topical preparations or injected agents, skin contour irregularities, skin discoloration and swelling, deep vein thrombosis, cardiac and pulmonary complications, pain, which may persist, fluid accumulation, wrinkling of the skin over the implanmt, changes in nipple or breast sensation, implant leakage or rupture, faulty position of the implant, persistent pain, formation of tight scar tissue around the implant (capsular contracture).  We placed injectable saline in the Expander using a sterile technique: Right: 0 cc for a total of 350 / 350 cc Left: 50 cc for a total of 400 / 350 cc   Electronically signed by: Carola Rhine Jassmin Kemmerer, PA-C 06/18/2020 4:02 PM

## 2020-06-19 LAB — BASIC METABOLIC PANEL
Anion gap: 9 (ref 5–15)
BUN: 11 mg/dL (ref 6–20)
CO2: 25 mmol/L (ref 22–32)
Calcium: 9.4 mg/dL (ref 8.9–10.3)
Chloride: 104 mmol/L (ref 98–111)
Creatinine, Ser: 0.59 mg/dL (ref 0.44–1.00)
GFR, Estimated: >60 mL/min (ref >60)
Glucose, Bld: 120 mg/dL — ABNORMAL HIGH (ref 70–99)
Potassium: 4 mmol/L (ref 3.5–5.1)
Sodium: 138 mmol/L (ref 135–145)

## 2020-06-20 ENCOUNTER — Other Ambulatory Visit (HOSPITAL_COMMUNITY)
Admission: RE | Admit: 2020-06-20 | Discharge: 2020-06-20 | Disposition: A | Discharge status: Home / Self Care | Payer: Managed Care, Other (non HMO) | Source: Ambulatory Visit | Attending: Plastic Surgery | Admitting: Plastic Surgery

## 2020-06-20 DIAGNOSIS — Z20822 Contact with and (suspected) exposure to covid-19: Secondary | ICD-10-CM | POA: Insufficient documentation

## 2020-06-20 DIAGNOSIS — Z01812 Encounter for preprocedural laboratory examination: Secondary | ICD-10-CM | POA: Insufficient documentation

## 2020-06-20 LAB — SARS CORONAVIRUS 2 (TAT 6-24 HRS): SARS Coronavirus 2: NEGATIVE

## 2020-06-23 ENCOUNTER — Ambulatory Visit (HOSPITAL_BASED_OUTPATIENT_CLINIC_OR_DEPARTMENT_OTHER): Payer: Managed Care, Other (non HMO) | Admitting: Anesthesiology

## 2020-06-23 ENCOUNTER — Encounter (HOSPITAL_BASED_OUTPATIENT_CLINIC_OR_DEPARTMENT_OTHER)
Admission: RE | Disposition: A | Discharge status: Home / Self Care | Payer: Self-pay | Source: Home / Self Care | Attending: Plastic Surgery

## 2020-06-23 ENCOUNTER — Other Ambulatory Visit: Payer: Self-pay

## 2020-06-23 ENCOUNTER — Ambulatory Visit (HOSPITAL_BASED_OUTPATIENT_CLINIC_OR_DEPARTMENT_OTHER)
Admission: RE | Admit: 2020-06-23 | Discharge: 2020-06-23 | Disposition: A | Discharge status: Home / Self Care | Payer: Managed Care, Other (non HMO) | Attending: Plastic Surgery | Admitting: Plastic Surgery

## 2020-06-23 ENCOUNTER — Encounter (HOSPITAL_BASED_OUTPATIENT_CLINIC_OR_DEPARTMENT_OTHER): Payer: Self-pay | Admitting: Plastic Surgery

## 2020-06-23 DIAGNOSIS — Z800 Family history of malignant neoplasm of digestive organs: Secondary | ICD-10-CM | POA: Diagnosis not present

## 2020-06-23 DIAGNOSIS — Z421 Encounter for breast reconstruction following mastectomy: Secondary | ICD-10-CM

## 2020-06-23 DIAGNOSIS — Z808 Family history of malignant neoplasm of other organs or systems: Secondary | ICD-10-CM | POA: Diagnosis not present

## 2020-06-23 DIAGNOSIS — Z853 Personal history of malignant neoplasm of breast: Secondary | ICD-10-CM | POA: Insufficient documentation

## 2020-06-23 DIAGNOSIS — Z888 Allergy status to other drugs, medicaments and biological substances status: Secondary | ICD-10-CM | POA: Diagnosis not present

## 2020-06-23 DIAGNOSIS — E119 Type 2 diabetes mellitus without complications: Secondary | ICD-10-CM | POA: Insufficient documentation

## 2020-06-23 DIAGNOSIS — N650 Deformity of reconstructed breast: Secondary | ICD-10-CM

## 2020-06-23 HISTORY — DX: Depression, unspecified: F32.A

## 2020-06-23 HISTORY — PX: REMOVAL OF BILATERAL TISSUE EXPANDERS WITH PLACEMENT OF BILATERAL BREAST IMPLANTS: SHX6431

## 2020-06-23 HISTORY — DX: Anxiety disorder, unspecified: F41.9

## 2020-06-23 LAB — POCT PREGNANCY, URINE: Preg Test, Ur: NEGATIVE

## 2020-06-23 SURGERY — REMOVAL, TISSUE EXPANDER, BREAST, BILATERAL, WITH BILATERAL IMPLANT IMPLANT INSERTION
Anesthesia: General | Site: Breast | Laterality: Bilateral

## 2020-06-23 MED ORDER — SCOPOLAMINE 1 MG/3DAYS TD PT72
1.0000 | MEDICATED_PATCH | TRANSDERMAL | Status: DC
  Administered 2020-06-23: 1.5 mg via TRANSDERMAL

## 2020-06-23 MED ORDER — DIPHENHYDRAMINE HCL 50 MG/ML IJ SOLN
INTRAMUSCULAR | Status: AC
  Filled 2020-06-23: qty 1

## 2020-06-23 MED ORDER — ACETAMINOPHEN 10 MG/ML IV SOLN: 1000.0000 mg | Freq: Once | INTRAVENOUS | Status: DC | PRN

## 2020-06-23 MED ORDER — OXYCODONE HCL 5 MG PO TABS
5.0000 mg | ORAL_TABLET | Freq: Once | ORAL | Status: AC | PRN
  Administered 2020-06-23: 5 mg via ORAL

## 2020-06-23 MED ORDER — DEXAMETHASONE SODIUM PHOSPHATE 10 MG/ML IJ SOLN
INTRAMUSCULAR | Status: DC | PRN
  Administered 2020-06-23: 5 mg via INTRAVENOUS

## 2020-06-23 MED ORDER — CEFAZOLIN SODIUM-DEXTROSE 2-4 GM/100ML-% IV SOLN
INTRAVENOUS | Status: AC
  Filled 2020-06-23: qty 100

## 2020-06-23 MED ORDER — KETOROLAC TROMETHAMINE 30 MG/ML IJ SOLN
30.0000 mg | Freq: Once | INTRAMUSCULAR | Status: AC
  Administered 2020-06-23: 30 mg via INTRAVENOUS

## 2020-06-23 MED ORDER — MIDAZOLAM HCL 5 MG/5ML IJ SOLN
INTRAMUSCULAR | Status: DC | PRN
  Administered 2020-06-23: 2 mg via INTRAVENOUS

## 2020-06-23 MED ORDER — FENTANYL CITRATE (PF) 100 MCG/2ML IJ SOLN
INTRAMUSCULAR | Status: AC
  Filled 2020-06-23: qty 2

## 2020-06-23 MED ORDER — OXYCODONE HCL 5 MG PO TABS
ORAL_TABLET | ORAL | Status: AC
  Filled 2020-06-23: qty 1

## 2020-06-23 MED ORDER — PROPOFOL 500 MG/50ML IV EMUL
INTRAVENOUS | Status: DC | PRN
  Administered 2020-06-23: 25 ug/kg/min via INTRAVENOUS

## 2020-06-23 MED ORDER — KETOROLAC TROMETHAMINE 30 MG/ML IJ SOLN
INTRAMUSCULAR | Status: AC
  Filled 2020-06-23: qty 1

## 2020-06-23 MED ORDER — AMISULPRIDE (ANTIEMETIC) 5 MG/2ML IV SOLN
INTRAVENOUS | Status: AC
  Filled 2020-06-23: qty 2

## 2020-06-23 MED ORDER — SODIUM CHLORIDE 0.9 % IV SOLN
INTRAVENOUS | Status: AC
  Filled 2020-06-23 (×2): qty 10

## 2020-06-23 MED ORDER — PROMETHAZINE HCL 25 MG/ML IJ SOLN
6.2500 mg | INTRAMUSCULAR | Status: DC | PRN
  Administered 2020-06-23: 12.5 mg via INTRAVENOUS

## 2020-06-23 MED ORDER — FENTANYL CITRATE (PF) 100 MCG/2ML IJ SOLN
25.0000 ug | INTRAMUSCULAR | Status: DC | PRN
  Administered 2020-06-23 (×2): 25 ug via INTRAVENOUS
  Administered 2020-06-23: 50 ug via INTRAVENOUS

## 2020-06-23 MED ORDER — ONDANSETRON HCL 4 MG/2ML IJ SOLN
INTRAMUSCULAR | Status: DC | PRN
  Administered 2020-06-23: 4 mg via INTRAVENOUS

## 2020-06-23 MED ORDER — DIPHENHYDRAMINE HCL 50 MG/ML IJ SOLN
12.5000 mg | Freq: Once | INTRAMUSCULAR | Status: AC
  Administered 2020-06-23: 12.5 mg via INTRAVENOUS

## 2020-06-23 MED ORDER — ONDANSETRON HCL 4 MG/2ML IJ SOLN
INTRAMUSCULAR | Status: AC
  Filled 2020-06-23: qty 2

## 2020-06-23 MED ORDER — PROPOFOL 500 MG/50ML IV EMUL
INTRAVENOUS | Status: AC
  Filled 2020-06-23: qty 50

## 2020-06-23 MED ORDER — MIDAZOLAM HCL 2 MG/2ML IJ SOLN
INTRAMUSCULAR | Status: AC
  Filled 2020-06-23: qty 2

## 2020-06-23 MED ORDER — LACTATED RINGERS IV SOLN: INTRAVENOUS | Status: DC

## 2020-06-23 MED ORDER — PROMETHAZINE HCL 25 MG/ML IJ SOLN
INTRAMUSCULAR | Status: AC
  Filled 2020-06-23: qty 1

## 2020-06-23 MED ORDER — CEFAZOLIN SODIUM-DEXTROSE 2-4 GM/100ML-% IV SOLN
2.0000 g | INTRAVENOUS | Status: AC
  Administered 2020-06-23: 2 g via INTRAVENOUS

## 2020-06-23 MED ORDER — AMISULPRIDE (ANTIEMETIC) 5 MG/2ML IV SOLN
10.0000 mg | Freq: Once | INTRAVENOUS | Status: AC | PRN
  Administered 2020-06-23: 10 mg via INTRAVENOUS

## 2020-06-23 MED ORDER — FENTANYL CITRATE (PF) 100 MCG/2ML IJ SOLN
INTRAMUSCULAR | Status: DC | PRN
  Administered 2020-06-23 (×2): 25 ug via INTRAVENOUS
  Administered 2020-06-23: 50 ug via INTRAVENOUS

## 2020-06-23 MED ORDER — PROPOFOL 10 MG/ML IV BOLUS
INTRAVENOUS | Status: DC | PRN
  Administered 2020-06-23: 140 mg via INTRAVENOUS

## 2020-06-23 MED ORDER — SCOPOLAMINE 1 MG/3DAYS TD PT72
MEDICATED_PATCH | TRANSDERMAL | Status: AC
  Filled 2020-06-23: qty 1

## 2020-06-23 MED ORDER — LIDOCAINE HCL (CARDIAC) PF 100 MG/5ML IV SOSY
PREFILLED_SYRINGE | INTRAVENOUS | Status: DC | PRN
  Administered 2020-06-23: 60 mg via INTRATRACHEAL

## 2020-06-23 MED ORDER — PROPOFOL 10 MG/ML IV BOLUS
INTRAVENOUS | Status: AC
  Filled 2020-06-23: qty 20

## 2020-06-23 MED ORDER — OXYCODONE HCL 5 MG/5ML PO SOLN: 5.0000 mg | Freq: Once | ORAL | Status: AC | PRN

## 2020-06-23 SURGICAL SUPPLY — 69 items
APL PRP STRL LF DISP 70% ISPRP (MISCELLANEOUS) ×1
BAG DECANTER FOR FLEXI CONT (MISCELLANEOUS) ×2 IMPLANT
BIOPATCH RED 1 DISK 7.0 (GAUZE/BANDAGES/DRESSINGS) IMPLANT
BLADE SURG 10 STRL SS (BLADE) ×2 IMPLANT
BLADE SURG 15 STRL LF DISP TIS (BLADE) IMPLANT
BLADE SURG 15 STRL SS (BLADE)
BNDG ELASTIC 6X5.8 VLCR STR LF (GAUZE/BANDAGES/DRESSINGS) ×2 IMPLANT
BNDG GAUZE ELAST 4 BULKY (GAUZE/BANDAGES/DRESSINGS) IMPLANT
CANISTER SUCT 1200ML W/VALVE (MISCELLANEOUS) ×2 IMPLANT
CHLORAPREP W/TINT 26 (MISCELLANEOUS) ×2 IMPLANT
COVER BACK TABLE 60X90IN (DRAPES) ×2 IMPLANT
COVER MAYO STAND STRL (DRAPES) ×2 IMPLANT
COVER WAND RF STERILE (DRAPES) IMPLANT
DECANTER SPIKE VIAL GLASS SM (MISCELLANEOUS) IMPLANT
DRAIN CHANNEL 15F RND FF W/TCR (WOUND CARE) ×2 IMPLANT
DRAPE TOP ARMCOVERS (MISCELLANEOUS) ×2 IMPLANT
DRAPE UTILITY XL STRL (DRAPES) ×2 IMPLANT
DRSG PAD ABDOMINAL 8X10 ST (GAUZE/BANDAGES/DRESSINGS) ×4 IMPLANT
DRSG TEGADERM 4X10 (GAUZE/BANDAGES/DRESSINGS) IMPLANT
DRSG TEGADERM 4X4.75 (GAUZE/BANDAGES/DRESSINGS) IMPLANT
ELECT BLADE 4.0 EZ CLEAN MEGAD (MISCELLANEOUS)
ELECT COATED BLADE 2.86 ST (ELECTRODE) IMPLANT
ELECT REM PT RETURN 9FT ADLT (ELECTROSURGICAL) ×2
ELECTRODE BLDE 4.0 EZ CLN MEGD (MISCELLANEOUS) IMPLANT
ELECTRODE REM PT RTRN 9FT ADLT (ELECTROSURGICAL) ×1 IMPLANT
EVACUATOR SILICONE 100CC (DRAIN) ×2 IMPLANT
FUNNEL KELLER 2 DISP (MISCELLANEOUS) IMPLANT
GLOVE SRG 8 PF TXTR STRL LF DI (GLOVE) ×3 IMPLANT
GLOVE SURG ENC MOIS LTX SZ7 (GLOVE) ×2 IMPLANT
GLOVE SURG ENC MOIS LTX SZ7.5 (GLOVE) IMPLANT
GLOVE SURG ENC TEXT LTX SZ7.5 (GLOVE) ×8 IMPLANT
GLOVE SURG UNDER POLY LF SZ8 (GLOVE) ×6
GOWN STRL REUS W/ TWL LRG LVL3 (GOWN DISPOSABLE) ×2 IMPLANT
GOWN STRL REUS W/TWL LRG LVL3 (GOWN DISPOSABLE) ×4
IMPL BREAST P5.8XHI RND 415 (Breast) ×2 IMPLANT
IMPL BRST P5.8XHI RND 415CC (Breast) ×2 IMPLANT
IMPLANT BREAST GEL 415CC (Breast) ×4 IMPLANT
IV NS 500ML (IV SOLUTION)
IV NS 500ML BAXH (IV SOLUTION) IMPLANT
KIT FILL SYSTEM UNIVERSAL (SET/KITS/TRAYS/PACK) IMPLANT
MARKER SKIN DUAL TIP RULER LAB (MISCELLANEOUS) IMPLANT
NEEDLE HYPO 25X1 1.5 SAFETY (NEEDLE) IMPLANT
PACK BASIN DAY SURGERY FS (CUSTOM PROCEDURE TRAY) ×2 IMPLANT
PENCIL SMOKE EVACUATOR (MISCELLANEOUS) ×2 IMPLANT
PIN SAFETY STERILE (MISCELLANEOUS) ×2 IMPLANT
RETRACTOR ONETRAX LX 135X30 (MISCELLANEOUS) ×2 IMPLANT
SHEET MEDIUM DRAPE 40X70 STRL (DRAPES) ×8 IMPLANT
SIZER BREAST REUSE 415CC (SIZER) ×4
SIZER BRST REUSE P5.8XHI 415CC (SIZER) ×2 IMPLANT
SLEEVE SCD COMPRESS KNEE MED (STOCKING) ×2 IMPLANT
SPONGE LAP 18X18 RF (DISPOSABLE) ×4 IMPLANT
STAPLER VISISTAT 35W (STAPLE) IMPLANT
STRIP CLOSURE SKIN 1/2X4 (GAUZE/BANDAGES/DRESSINGS) ×2 IMPLANT
SUT ETHILON 2 0 FS 18 (SUTURE) IMPLANT
SUT MON AB 3-0 SH 27 (SUTURE)
SUT MON AB 3-0 SH27 (SUTURE) IMPLANT
SUT MON AB 4-0 PC3 18 (SUTURE) ×2 IMPLANT
SUT PDS 3-0 CT2 (SUTURE) ×4
SUT PDS II 3-0 CT2 27 ABS (SUTURE) ×2 IMPLANT
SUT PLAIN 5 0 P 3 18 (SUTURE) IMPLANT
SUT VLOC 90 P-14 23 (SUTURE) ×4 IMPLANT
SYR BULB IRRIG 60ML STRL (SYRINGE) ×2 IMPLANT
SYR CONTROL 10ML LL (SYRINGE) IMPLANT
TAPE MEASURE VINYL STERILE (MISCELLANEOUS) IMPLANT
TOWEL GREEN STERILE FF (TOWEL DISPOSABLE) ×4 IMPLANT
TRAY FOLEY W/BAG SLVR 14FR LF (SET/KITS/TRAYS/PACK) IMPLANT
TUBE CONNECTING 20X1/4 (TUBING) ×2 IMPLANT
UNDERPAD 30X36 HEAVY ABSORB (UNDERPADS AND DIAPERS) ×4 IMPLANT
YANKAUER SUCT BULB TIP NO VENT (SUCTIONS) ×2 IMPLANT

## 2020-06-23 NOTE — Op Note (Signed)
Operative Note   DATE OF OPERATION: 06/23/2020  SURGICAL DEPARTMENT: Plastic Surgery  PREOPERATIVE DIAGNOSES: Bilateral breast reconstruction after mastectomy  POSTOPERATIVE DIAGNOSES:  same  PROCEDURE: 1.  Removal of bilateral breast tissue expanders 2.  Bilateral superior capsulotomies 3.  Placement of bilateral silicone gel breast implants  SURGEON: Talmadge Coventry, MD  ASSISTANT: Verdie Shire, PA The advanced practice practitioner (APP) assisted throughout the case.  The APP was essential in retraction and counter traction when needed to make the case progress smoothly.  This retraction and assistance made it possible to see the tissue plans for the procedure.  The assistance was needed for blood control, tissue re-approximation and assisted with closure of the incision site.  ANESTHESIA:  General.   COMPLICATIONS: None.   INDICATIONS FOR PROCEDURE:  The patient, Sharayah Renfrow is a 45 y.o. female born on 1976/03/26, is here for treatment of bilateral mastectomy defect MRN: [id number]  CONSENT:  Informed consent was obtained directly from the patient. Risks, benefits and alternatives were fully discussed. Specific risks including but not limited to bleeding, infection, hematoma, seroma, scarring, pain, contracture, asymmetry, wound healing problems, and need for further surgery were all discussed. The patient did have an ample opportunity to have questions answered to satisfaction.   DESCRIPTION OF PROCEDURE:  The patient was taken to the operating room. SCDs were placed and antibiotics were given. General anesthesia was administered.  The patient's operative site was prepped and draped in a sterile fashion. A time out was performed and all information was confirmed to be correct.  I started with the right side.  I incised along her previous incision with a 10 blade.  I dissected down to the expander with cautery.  The fluid was evacuated and the and the expander was removed.   Lighted retractor was utilized to make a superior capsulotomy and it for 15 cc sizer was placed.  Same thing was then done to the left side and a 415 sizer was placed.  I stapled the skin and set her up.  Overall her symmetry was quite good.  There was a small area of tethering in the inferior portion of the flap on the right side.  On further examination this area was a bit firm and did not expand the same way as the rest of the skin.  I suspected a little bit of fat necrosis on the undersurface of the skin in that area so I scored the capsule to try to help expand it.  At this point I felt her size size shape and symmetry were as good as I was going to get.  The sizers were removed and both pockets were irrigated with triple antibiotic solution.  I changed my gloves.  The implants were then placed with a Keller funnel so as not to touch the skin.  On the left side I used a Mentor memory gel extra smooth high profile breast 415cc implant with a serial number of [id number].  On the right side I used a Mentor memory gel smooth high profile extra 415 cc breast implant with a serial number of [id number].  Incision was then closed in layers with interrupted buried 3-0 PDS sutures and a running 3 OV lock.  Steri-Strips were applied followed by a soft compressive dressing.  The patient tolerated the procedure well.  There were no complications. The patient was allowed to wake from anesthesia, extubated and taken to the recovery room in satisfactory condition.

## 2020-06-23 NOTE — Transfer of Care (Signed)
Immediate Anesthesia Transfer of Care Note  Patient: Natalie Cardenas  Procedure(s) Performed: REMOVAL OF BILATERAL TISSUE EXPANDERS WITH PLACEMENT OF BILATERAL BREAST IMPLANTS (Bilateral Breast)  Patient Location: PACU  Anesthesia Type:General  Level of Consciousness: drowsy and patient cooperative  Airway & Oxygen Therapy: Patient Spontanous Breathing and Patient connected to face mask oxygen  Post-op Assessment: Report given to RN and Post -op Vital signs reviewed and stable  Post vital signs: Reviewed and stable  Last Vitals:  Vitals Value Taken Time  BP    Temp    Pulse 74 06/23/20 1256  Resp 12 06/23/20 1256  SpO2 100 % 06/23/20 1256  Vitals shown include unvalidated device data.  Last Pain:  Vitals:   06/23/20 1015  TempSrc: Oral  PainSc: 0-No pain      Patients Stated Pain Goal: 3 (48/40/39 [id number])  Complications: No complications documented.

## 2020-06-23 NOTE — Brief Op Note (Signed)
06/23/2020  12:50 PM  PATIENT:  Natalie Cardenas  45 y.o. female  PRE-OPERATIVE DIAGNOSIS:  history of breast cancer  POST-OPERATIVE DIAGNOSIS:  history of breast cancer  PROCEDURE:  Procedure(s) with comments: REMOVAL OF BILATERAL TISSUE EXPANDERS WITH PLACEMENT OF BILATERAL BREAST IMPLANTS (Bilateral) - 1.5 hours  SURGEON:  Surgeon(s) and Role:    * Pace, Steffanie Dunn, MD - Primary  PHYSICIAN ASSISTANT: Software engineer, PA  ASSISTANTS: none   ANESTHESIA:   general  EBL:  20   BLOOD ADMINISTERED:none  DRAINS: none   LOCAL MEDICATIONS USED:  NONE  SPECIMEN:  No Specimen  DISPOSITION OF SPECIMEN:  PATHOLOGY  COUNTS:  YES  TOURNIQUET:  * No tourniquets in log *  DICTATION: .Dragon Dictation  PLAN OF CARE: Discharge to home after PACU  PATIENT DISPOSITION:  PACU - hemodynamically stable.   Delay start of Pharmacological VTE agent (>24hrs) due to surgical blood loss or risk of bleeding: not applicable

## 2020-06-23 NOTE — Discharge Instructions (Addendum)
Activity As tolerated: NO showers until 3 days after surgery. Keep ACE wrap in place 24/7, adjust as needed. You can switch to a sports bra after 3-5 days depending on comfort level with sports bra.  NO driving while in pain, taking pain medication or if you are unable to safely react to traffic. No heavy activities. No lifting > 15 pounds.  Take Pain medication prescribed at your pre-op as needed for severe pain. Otherwise, you can use ibuprofen or tylenol PRN as well as indicated on your pre-op breast reduction instruction sheet provided at your pre-op appointment.  Diet: Regular. Drink plenty of fluids (water, avoid juice/soda) and eat healthy, high protein, low carbs.  Wound Care: Keep dressing clean & dry. You may change bandages after showering if you continue to notice some drainage.  Special Instructions: Call Doctor if any unusual problems occur such as pain, excessive Bleeding, unrelieved Nausea/vomiting, Fever &/or chills  Follow-up appointment: Scheduled for next week.  You had Oxycodone at 2:50pm today. You may have Ibuprofen after 8pm tonight, if needed.   Post Anesthesia Home Care Instructions  Activity: Get plenty of rest for the remainder of the day. A responsible individual must stay with you for 24 hours following the procedure.  For the next 24 hours, DO NOT: -Drive a car -Paediatric nurse -Drink alcoholic beverages -Take any medication unless instructed by your physician -Make any legal decisions or sign important papers.  Meals: Start with liquid foods such as gelatin or soup. Progress to regular foods as tolerated. Avoid greasy, spicy, heavy foods. If nausea and/or vomiting occur, drink only clear liquids until the nausea and/or vomiting subsides. Call your physician if vomiting continues.  Special Instructions/Symptoms: Your throat may feel dry or sore from the anesthesia or the breathing tube placed in your throat during surgery. If this causes discomfort,  gargle with warm salt water. The discomfort should disappear within 24 hours.  If you had a scopolamine patch placed behind your ear for the management of post- operative nausea and/or vomiting:  1. The medication in the patch is effective for 72 hours, after which it should be removed.  Wrap patch in a tissue and discard in the trash. Wash hands thoroughly with soap and water. 2. You may remove the patch earlier than 72 hours if you experience unpleasant side effects which may include dry mouth, dizziness or visual disturbances. 3. Avoid touching the patch. Wash your hands with soap and water after contact with the patch.

## 2020-06-23 NOTE — Anesthesia Postprocedure Evaluation (Signed)
Anesthesia Post Note  Patient: Natalie Cardenas  Procedure(s) Performed: REMOVAL OF BILATERAL TISSUE EXPANDERS WITH PLACEMENT OF BILATERAL BREAST IMPLANTS (Bilateral Breast)     Patient location during evaluation: PACU Anesthesia Type: General Level of consciousness: awake and alert and oriented Pain management: pain level controlled Vital Signs Assessment: post-procedure vital signs reviewed and stable Respiratory status: spontaneous breathing, nonlabored ventilation and respiratory function stable Cardiovascular status: blood pressure returned to baseline and stable Postop Assessment: no headache and no backache Anesthetic complications: no Comments: She had some PON which was treated with Barhemsys and phenergan. Pain appears under control now.    No complications documented.  Last Vitals:  Vitals:   06/23/20 1336 06/23/20 1345  BP:  135/82  Pulse: 83 76  Resp: 15 (!) 7  Temp:    SpO2: 97% 100%    Last Pain:  Vitals:   06/23/20 1321  TempSrc:   PainSc: 8                  Natalie Malan A.

## 2020-06-23 NOTE — Interval H&P Note (Signed)
History and Physical Interval Note:  06/23/2020 10:29 AM  Natalie Cardenas  has presented today for surgery, with the diagnosis of history of breast cancer.  The various methods of treatment have been discussed with the patient and family. After consideration of risks, benefits and other options for treatment, the patient has consented to  Procedure(s) with comments: REMOVAL OF BILATERAL TISSUE EXPANDERS WITH PLACEMENT OF BILATERAL BREAST IMPLANTS (Bilateral) - 1.5 hours as a surgical intervention.  The patient's history has been reviewed, patient examined, no change in status, stable for surgery.  I have reviewed the patient's chart and labs.  Questions were answered to the patient's satisfaction.     [name]

## 2020-06-23 NOTE — Anesthesia Preprocedure Evaluation (Addendum)
Anesthesia Evaluation  Patient identified by MRN, date of birth, ID band Patient awake    Reviewed: Allergy & Precautions, NPO status , Patient's Chart, lab work & pertinent test results  Airway Mallampati: II  TM Distance: >3 FB Neck ROM: Full    Dental no notable dental hx.    Pulmonary neg pulmonary ROS,    Pulmonary exam normal breath sounds clear to auscultation       Cardiovascular negative cardio ROS Normal cardiovascular exam Rhythm:Regular Rate:Normal  ECG: NSR, rate 81   Neuro/Psych PSYCHIATRIC DISORDERS Anxiety Depression negative neurological ROS     GI/Hepatic negative GI ROS, Neg liver ROS,   Endo/Other  diabetesBreast cancer  Renal/GU negative Renal ROS     Musculoskeletal negative musculoskeletal ROS (+)   Abdominal   Peds  Hematology negative hematology ROS (+)   Anesthesia Other Findings history of breast cancer  Reproductive/Obstetrics hcg negative                            Anesthesia Physical Anesthesia Plan  ASA: II  Anesthesia Plan: General   Post-op Pain Management:    Induction: Intravenous  PONV Risk Score and Plan: 3 and Ondansetron, Dexamethasone, Midazolam and Treatment may vary due to age or medical condition  Airway Management Planned: LMA  Additional Equipment:   Intra-op Plan:   Post-operative Plan: Extubation in OR  Informed Consent: I have reviewed the patients History and Physical, chart, labs and discussed the procedure including the risks, benefits and alternatives for the proposed anesthesia with the patient or authorized representative who has indicated his/her understanding and acceptance.     Dental advisory given  Plan Discussed with: CRNA  Anesthesia Plan Comments:        Anesthesia Quick Evaluation

## 2020-06-23 NOTE — Anesthesia Procedure Notes (Signed)
Procedure Name: LMA Insertion Date/Time: 06/23/2020 11:51 AM Performed by: Glory Buff, CRNA Pre-anesthesia Checklist: Patient identified, Emergency Drugs available, Suction available and Patient being monitored Patient Re-evaluated:Patient Re-evaluated prior to induction Oxygen Delivery Method: Circle system utilized Preoxygenation: Pre-oxygenation with 100% oxygen Induction Type: IV induction LMA: LMA inserted LMA Size: 4.0 Number of attempts: 1 Placement Confirmation: positive ETCO2 Tube secured with: Tape Dental Injury: Teeth and Oropharynx as per pre-operative assessment

## 2020-06-24 ENCOUNTER — Other Ambulatory Visit: Payer: Self-pay | Admitting: Surgical

## 2020-06-24 ENCOUNTER — Encounter (HOSPITAL_BASED_OUTPATIENT_CLINIC_OR_DEPARTMENT_OTHER): Payer: Self-pay | Admitting: Plastic Surgery

## 2020-06-25 ENCOUNTER — Telehealth: Payer: Self-pay

## 2020-06-25 NOTE — Telephone Encounter (Signed)
Patient called to say that she had surgery with Dr. Claudia Desanctis and Rodman Key on Monday and they have her wrapped in an ace bandage.  Patient said she thinks they told her to leave it on for three days.  She would like for [place] to call her to confirm she is understanding it correctly.  Patient would also like to know if she can loosen the wrap a little bit.  She said it's pretty tight, it feels like her ribcage is breaking or she can't breathe.  Please call.

## 2020-06-25 NOTE — Telephone Encounter (Signed)
Pt is status-post surgery with Dr. Claudia Desanctis on 06/23/20 for bilateral removal of tissue expanders & placement of implants ( breast reconstruction) Returned call to pt- she is requesting clarification with the following: She wants to loosen the ace wrap d/t " tightness & feeling like she cannot deep breath".  She has not removed it.  Pain is moderate but controlled with RX pain med. She denies any chills/fever & no n/v.  She does not have any drains in place & no drainage noted on the outer ace wrap. She is eating/drinking & has no difficulty with urination- she has not had a bowel movement since before surgery. She is taking OTC stool softeners. She will add OTC fiber supplement in addition to stool softener. I instructed her to loosen the ace wrap- but not to remove it until tomorrow to shower & then replace it with sports bra. She understands the above directions & she will call for any change or concerns.

## 2020-07-02 ENCOUNTER — Encounter: Payer: Self-pay | Admitting: Plastic Surgery

## 2020-07-02 ENCOUNTER — Other Ambulatory Visit: Payer: Self-pay

## 2020-07-02 ENCOUNTER — Ambulatory Visit (INDEPENDENT_AMBULATORY_CARE_PROVIDER_SITE_OTHER): Payer: Managed Care, Other (non HMO) | Admitting: Plastic Surgery

## 2020-07-02 VITALS — BP 128/83 | HR 103

## 2020-07-02 DIAGNOSIS — D0511 Intraductal carcinoma in situ of right breast: Secondary | ICD-10-CM

## 2020-07-02 NOTE — Progress Notes (Signed)
Patient presents about 1 week postop from removal of tissue expanders and replacement with gel implants.  She overall feels good but has a bit of tightness that is wrapping around towards her back.  She also feels fatigued.  On exam her incisions are healing well.  The position of the implants looks to be good.  There is a little bit of upper pole deficiency on the left side compared to the right which was present when she had the expanders in place to a lesser degree.  The fold seems to be symmetric on both sides and the volume is symmetric with the implants.  There just does seem to be a deficiency in the soft tissue there with a more visible transition between the chest and the implant.  Otherwise there is no obvious subcutaneous fluid  I suspect the tightness that she is feeling will improve with time.  We discussed a number of different medications and she is fearful that she would react to them in a negative way so were discussed to let time take care of that for now.  I do think she would benefit from fat grafting in the future but will let the implant settle out to make a more definitive plan on that.  I will plan to see her again in around 3 weeks time.  All of her questions were answered.

## 2020-07-07 ENCOUNTER — Telehealth: Payer: Self-pay

## 2020-07-07 NOTE — Telephone Encounter (Signed)
Returned patients call. She had spoken Dr. Philmore Pali office and they suggested to try PT for chest and back tightness. She has tried several medications and nothing seems to help, other than Gabapentin. She is still real tired since her surgery. Dr. Claudia Desanctis advised at the last office visit, that Gabapentin will make her tired and sleepy. I had reminded her the sutures are dissolvable and will fall out overtime. She can continue using a guaze to cover, or if they still bother her in a few weeks, we will have her come in to have them removed.  She mentioned that she is going back to work next week and will try to go in the afternoons.  If she needs [place] to send a letter to her employer,give our office a call and provide a fax number. Patient understood and agreed with plan

## 2020-07-07 NOTE — Telephone Encounter (Signed)
Patient called to say that she had surgery with Dr. Claudia Desanctis two weeks ago to remove her expanders and put in her implants.  Patient said at the incision site, there are two stitches that are coming through her skin and poking her.  She said that she put gauze over them but would like to know if they are supposed to dissolve on their own or if she should come in to have them removed.  If they are going to dissolve, she would like to know how long that may take.    Also, patient said that she is still experiencing tightness through her chest and back if she gets up and starts doing anything.  She said that she is fine when she is at rest.  Patient is concerned because she goes back to work next week.  Is there anything we can suggest that would help?  Please call.

## 2020-07-09 ENCOUNTER — Encounter: Payer: Managed Care, Other (non HMO) | Admitting: Plastic Surgery

## 2020-07-17 ENCOUNTER — Other Ambulatory Visit: Payer: Self-pay

## 2020-07-17 ENCOUNTER — Ambulatory Visit (INDEPENDENT_AMBULATORY_CARE_PROVIDER_SITE_OTHER): Payer: Managed Care, Other (non HMO)

## 2020-07-17 VITALS — BP 124/87 | HR 84 | Temp 97.7°F | Ht 65.0 in | Wt 145.0 lb

## 2020-07-17 DIAGNOSIS — D0511 Intraductal carcinoma in situ of right breast: Secondary | ICD-10-CM

## 2020-07-17 NOTE — Progress Notes (Signed)
Pt in office today for suture removal from bilateral breast surgical incision. S/P breast reconstruction- removal of tissue expanders/implant placement by Dr. Claudia Desanctis on 06/23/20. She has sutures that protrude through the lateral/ inframammary fold incisions (x2 -left) & (x1-right).  C/O sutures "catching" on her clothing. I removed the remnants of the sutures & applied vaseline. Incisions are otherwise intact & no redness/bruising or other complication. She is having right arm/hand weakness & pain with reaching/repetitive motion- but she is being treated by PT for her ROM & strength. She would like to see Dr. Claudia Desanctis in the future regarding possible "lipo-filling" for more fullness on the upper part of her left breast & for "ripple/appearance" noted bilateral breast/upper area- mostly when bending over. We scheduled a f/u visit with Dr. Claudia Desanctis in May to discuss this further. She will call for any concerns/questions.

## 2020-07-17 NOTE — Patient Instructions (Signed)
Pt will call for any concerns F/U visit in May with Dr. Claudia Desanctis.

## 2020-07-21 ENCOUNTER — Telehealth: Payer: Self-pay

## 2020-07-21 NOTE — Telephone Encounter (Signed)
Returned patients call. LMVM, under the advise from Bonita-ok to use vasline and reinforce the dressing. One of the front staff will call her back to add on Bonita's schedule for this coming Weds.

## 2020-07-21 NOTE — Telephone Encounter (Signed)
Patient called to say that she had sutures removed last week and she now has a spot where the incision has opened up a little bit.  Patient said the size of the area is about 6 or 44mm x 20mm.  Patient said that she has been putting vaseline and gauze underneath.  She would like to know if this is ok to do.  Patient said that it's draining a little bit there and someone had told her not to put vaseline on it.  Please call and let her know if this is something she needs to have checked, if she needs to let it heal on it's own, and whether or not she needs to put anything on it.  Patient said that she has another stitch that is starting to poke through in a different area.  She said that she doesn't know how long she will let that go.  Please call.

## 2020-07-23 ENCOUNTER — Other Ambulatory Visit: Payer: Self-pay

## 2020-07-23 ENCOUNTER — Ambulatory Visit (INDEPENDENT_AMBULATORY_CARE_PROVIDER_SITE_OTHER): Payer: Managed Care, Other (non HMO)

## 2020-07-23 VITALS — BP 114/76 | HR 88 | Temp 97.6°F | Ht 65.0 in | Wt 145.0 lb

## 2020-07-23 DIAGNOSIS — D0511 Intraductal carcinoma in situ of right breast: Secondary | ICD-10-CM

## 2020-07-23 MED ORDER — SULFAMETHOXAZOLE-TRIMETHOPRIM 800-160 MG PO TABS: 1.0000 | ORAL_TABLET | Freq: Two times a day (BID) | ORAL | 0 refills | Status: AC

## 2020-07-23 NOTE — Patient Instructions (Addendum)
Pt will use vaseline/gauze as needed. Avoid extreme lifting/pulling/ and extension of right arm/hand for next 4 days. Take entire course of oral antibiotics per Dr. Claudia Desanctis. Call for any concerns. Next f/u in May

## 2020-07-23 NOTE — Progress Notes (Signed)
pt is in office for f/u- 4 week post-op bilateral breast tissue expander removal with placement of breast implants by Dr. Claudia Desanctis. Pt called the office on 07/21/20 for advice on treating this area. I had Olivia Mackie, CMA instruct her to use vaseline/gauze & we scheduled her to come in today for assessment. She presents with a .5 cm open wound right inframamary fold incision.  She noted minimal drainage (serosanguineous) & increased tenderness while bathing approximately 3 days ago. Denies any fever/chills & no marked redness or purulent drainage. Pt states she thinks it may be another suture that is being expelled from the incision. I removed 3 suture "tags" from other areas of bilateral incisions  last week that were protruding through the skin.- these areas have now healed completely. Bilateral incisions are intact & healing except for this site. Dr. Claudia Desanctis assessed the wound/incisions & advised her to continue use of vaseline/gauze & he will order antibiotic (PO). Per his advice she will cancel PT appointment tomorrow & will resume next week. She will return to work tomorrow but will avoid extreme lifting/pulling or extension/rotation of right arm d/t tenderness & decreased ROM to avoid any stress/tension at the incision site. Pt is reminded to call for any further concerns otherwise, she will see Dr. Claudia Desanctis in May.

## 2020-07-31 NOTE — Progress Notes (Signed)
Patient is a 45 year old female here for follow-up after removal of bilateral tissue expanders with placement of bilateral breast implants on 06/23/2020 with Dr. Claudia Desanctis.  She is 5 and half weeks postop. She reports overall she is doing well from her reconstruction recovery.  She reports that she has a 2 small wounds on the right breast incision that she has been treating with Vaseline and gauze.  She reports that she has returned to work and reports that she is having a lot of difficulty with tenderness and pain while at work, she reports that she feels like she can do her job effectively, however she is having pain throughout her daily tasks.  She reports that she notices swelling in her right axilla as well as her right arm and nerve pain in her right hand.  Of note she did have lymph node dissection on the right side.  She does not have any pain or tenderness in the left arm.  She reports that she is overall pleased with how things are healing and her reconstruction.  Chaperone present on exam On exam bilateral breast incisions are healing nicely.  She does have a small wound present on the right breast inframammary fold incision.  There is no surrounding erythema.  The wound is approximately 2 x 1 mm.  There is no foul odor or drainage noted from the wound.  On exam bilateral breasts are symmetric.  She does have some mild swelling of the right axilla.  She has tenderness palpation in the right axilla.  She has palpable bilateral radial pulses.  I discussed with the patient that her symptoms sound consistent with some lymphedema in the right arm.  I did discuss with her that a compression sleeve may be beneficial for her, however I think this is something that she should discuss with general surgery as well.  I recommend she hold off on physical therapy until her wound on the right inframammary fold has healed.  There is no sign of infection, seroma, hematoma.  Recommend Vaseline to the right breast  wound daily.  Recommend following up in 4 to 6 weeks for reevaluation.  Recommend calling with any questions or concerns or changes prior to that appointment.

## 2020-08-01 ENCOUNTER — Other Ambulatory Visit: Payer: Self-pay

## 2020-08-01 ENCOUNTER — Ambulatory Visit (INDEPENDENT_AMBULATORY_CARE_PROVIDER_SITE_OTHER): Payer: Managed Care, Other (non HMO) | Admitting: Surgical

## 2020-08-01 ENCOUNTER — Encounter: Payer: Self-pay | Admitting: Surgical

## 2020-08-01 VITALS — BP 124/78 | HR 78

## 2020-08-01 DIAGNOSIS — D0511 Intraductal carcinoma in situ of right breast: Secondary | ICD-10-CM

## 2020-08-01 DIAGNOSIS — Z9889 Other specified postprocedural states: Secondary | ICD-10-CM

## 2020-08-04 ENCOUNTER — Telehealth: Payer: Self-pay

## 2020-08-04 NOTE — Telephone Encounter (Signed)
Patient called to say that she noticed a stitch poking out at the spot where her incision opened.  Patient is wondering what she should do and if she needs to come in.  Please call.

## 2020-08-04 NOTE — Telephone Encounter (Signed)
Forward to [place]

## 2020-08-04 NOTE — Telephone Encounter (Signed)
Call returned to pt regarding the following: Pt reports that she has another " suture" that is protruding from her right IMF incision.  She was seen in the office last week with an "opened" area at this site.  She reports that the opening has decreased in size & has improved. Denies any drainage/chills or fever. I will add her on my schedule for Wed. 08/06/20 @ 2:30pm She will continue to use vaseline/gauze & she agrees with coming in on Wed.

## 2020-08-06 ENCOUNTER — Ambulatory Visit (INDEPENDENT_AMBULATORY_CARE_PROVIDER_SITE_OTHER): Payer: Managed Care, Other (non HMO)

## 2020-08-06 ENCOUNTER — Other Ambulatory Visit: Payer: Self-pay

## 2020-08-06 VITALS — BP 120/85 | HR 80 | Temp 97.7°F | Ht 65.0 in | Wt 142.0 lb

## 2020-08-06 DIAGNOSIS — Z9889 Other specified postprocedural states: Secondary | ICD-10-CM

## 2020-08-06 NOTE — Progress Notes (Signed)
Pt here today regarding a suture that she states is protruding through right breast IMF incision.   She denies any chills/fever and redness .  She has no drainage from the site. She does have "tenderness" at the site of the suture. On examination- I did identify a suture tag/knot that was protruding from the lateral incision - there was no definite opening & both left & right incisions appear intact.  I applied vaseline/gauze & instructed her to use as needed. She is also experiencing pain & limited ROM right arm/shoulder & axilla d/t possible lymphedema according to pt   (as per Dr. Donne Hazel)- for which he has referred her to OT/Lymphedema therapy.  She begins this treatment on Mon 08/11/20. I instructed her to call for any other concerns.

## 2020-08-12 ENCOUNTER — Telehealth: Payer: Self-pay

## 2020-08-12 NOTE — Telephone Encounter (Signed)
Patient left voicemail stating she has noticed another spot at the incision site that is starting to open. There are 3 ulcerated areas there that are lightly draining. She had been applying Vaseline and gauze. She would like to know if she should continue with this or if she should come in to be evaluated.

## 2020-08-13 NOTE — Telephone Encounter (Signed)
Called patient on (08/12/20) and [facility] @ 4:09pm) regarding the message below.  Informed the patient that I spoke with [facility] and he would like for her to keep using vaseline and gauze and continue to watch it and we will see her at her next appointment.    If she feels she really needs to be seen call and schedule an appointment with [facility] on tomorrow.//AB/CMA

## 2020-08-28 ENCOUNTER — Ambulatory Visit (INDEPENDENT_AMBULATORY_CARE_PROVIDER_SITE_OTHER): Payer: Managed Care, Other (non HMO) | Admitting: Plastic Surgery

## 2020-08-28 ENCOUNTER — Other Ambulatory Visit: Payer: Self-pay

## 2020-08-28 DIAGNOSIS — Z9889 Other specified postprocedural states: Secondary | ICD-10-CM

## 2020-08-28 DIAGNOSIS — D0511 Intraductal carcinoma in situ of right breast: Secondary | ICD-10-CM

## 2020-08-28 NOTE — Progress Notes (Signed)
Patient is a 45 year old female here for follow-up on her bilateral breast reconstruction.  She reports overall she is doing well.  She has been going to occupational therapy for assistance with lymphedema.  She has also been wearing lymphedema sleeves which have been helpful.  She reports that she has a suture within the right breast fold that is bothering her.  Chaperone present on exam On exam bilateral NAC's are viable.  Bilateral breast incisions are overall intact.  She does have a small wound where the suture is protruding through the skin on the right inframammary fold.  There is no surrounding erythema or cellulitic changes.  No purulent drainage noted.  Suture was removed, patient tolerated this fine.  Recommend Vaseline and gauze to the area until this has healed up.  Pictures were taken and placed in the patient's chart with patient's permission.  She is going to follow-up in 3 months for reevaluation and to discuss possible fat grafting to bilateral superior breast poles.  All of her questions were answered to her content.  There is no sign of infection, seroma, hematoma.

## 2020-09-18 ENCOUNTER — Ambulatory Visit: Payer: Managed Care, Other (non HMO) | Admitting: Plastic Surgery

## 2020-11-26 ENCOUNTER — Ambulatory Visit: Payer: Managed Care, Other (non HMO)

## 2020-11-28 ENCOUNTER — Ambulatory Visit: Payer: Managed Care, Other (non HMO) | Attending: General Surgery | Admitting: Physical Therapy

## 2020-11-28 ENCOUNTER — Other Ambulatory Visit: Payer: Self-pay

## 2020-11-28 DIAGNOSIS — G8929 Other chronic pain: Secondary | ICD-10-CM | POA: Diagnosis present

## 2020-11-28 DIAGNOSIS — M25611 Stiffness of right shoulder, not elsewhere classified: Secondary | ICD-10-CM | POA: Insufficient documentation

## 2020-11-28 DIAGNOSIS — I972 Postmastectomy lymphedema syndrome: Secondary | ICD-10-CM | POA: Insufficient documentation

## 2020-11-28 DIAGNOSIS — M6281 Muscle weakness (generalized): Secondary | ICD-10-CM | POA: Insufficient documentation

## 2020-11-28 DIAGNOSIS — M25511 Pain in right shoulder: Secondary | ICD-10-CM | POA: Diagnosis present

## 2020-11-28 DIAGNOSIS — Z483 Aftercare following surgery for neoplasm: Secondary | ICD-10-CM | POA: Insufficient documentation

## 2020-11-28 NOTE — Therapy (Signed)
[place], [place], [address] Phone: [phone]   Fax:  [phone]  Physical Therapy Evaluation  Patient Details  Name: Natalie Cardenas MRN: [id number] Date of Birth: April 11, 1976 Referring Provider (PT): Dr. Donne Hazel   Encounter Date: 11/28/2020   PT End of Session - 11/28/20 1346     Visit Number 1    Number of Visits 17    Date for PT Re-Evaluation 01/28/21    PT Start Time 1015   pt arrived late for appt   PT Stop Time 1105    PT Time Calculation (min) 50 min    Activity Tolerance Patient limited by pain    Behavior During Therapy [facility] for tasks assessed/performed             Past Medical History:  Diagnosis Date   Anxiety    Breast cancer ([place])    Depression    Diabetes mellitus without complication ([place])    Thyroid nodule     Past Surgical History:  Procedure Laterality Date   BREAST RECONSTRUCTION WITH PLACEMENT OF TISSUE EXPANDER AND FLEX HD (ACELLULAR HYDRATED DERMIS) Bilateral 04/29/2020   Procedure: IMMEDIATE BILATERAL BREAST RECONSTRUCTION WITH PLACEMENT OF TISSUE EXPANDER AND FLEX HD (ACELLULAR HYDRATED DERMIS);  Surgeon: [name], MD;  Location: [place];  Service: Plastics;  Laterality: Bilateral;   NIPPLE SPARING MASTECTOMY WITH SENTINEL LYMPH NODE BIOPSY Bilateral 04/29/2020   Procedure: BILATERAL NIPPLE SPARING MASTECTOMIES WITH RIGHT AXILLARY SENTINEL LYMPH NODE BIOPSY;  Surgeon: Rolm Bookbinder, MD;  Location: [place];  Service: General;  Laterality: Bilateral;  BILATERAL PEC BLOCK, RNFA   REMOVAL OF BILATERAL TISSUE EXPANDERS WITH PLACEMENT OF BILATERAL BREAST IMPLANTS Bilateral 06/23/2020   Procedure: REMOVAL OF BILATERAL TISSUE EXPANDERS WITH PLACEMENT OF BILATERAL BREAST IMPLANTS;  Surgeon: [name], MD;  Location: [place];  Service: Plastics;  Laterality: Bilateral;  1.5 hours   TONSILLECTOMY      There  were no vitals filed for this visit.    Subjective Assessment - 11/28/20 0916     Subjective She feels like she has "scab" under her right arm. It gets worse during the day ( she works as a Arboriculturist). Pt says she feel very weak as she has not been able to return to her exercise program. She says she has a lot of things( compression sleeves, compression bras, compression pad to wear inside bra, Flexitouch) but she is not sure how to use them the right way.  She is wondering if dry needling will help herdecrease the pain she feels.  It really helped her friend.    Pertinent History bilateral mastectomy in Apr 29 2020 with removal 2 nodes on the right with immediate exanders. Expanders were removed and implants placed Feb 28. Pt did not have chemo or radiation. She had problems with incision not healing after the implants ( took about 10 weeks). She started developing swelling in her back and under her right arm. She has a pump and has had physical therapy as her feels her range of motion in her shoulder. She has a sleeve and  gauntlet, cirucular knit, She has ordered bras from [employer] [place] and she is uncomfortable in all of them.She has a "cushion" that goes around her back that she got from [facility] [place]    Patient Stated Goals to get rid of the pain under her arm and return to exericse    Currently in  Pain? 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 Donne Hazel    Onset Date/Surgical Date 04/29/20    Hand Dominance Right    Prior Therapy Pt has had PT and OT at [place]   Weight Bearing Restrictions No      Balance Screen   Has the patient fallen in the past 6 months No    Has the patient had a decrease in activity level because of a fear of falling?  No    Is the patient reluctant to leave their home because of a fear of falling?  No      Home Environment   Living Environment Private residence    Living Arrangements Spouse/significant other   4 kids, ages 45-10 . 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 She appears axious and expresses frustration that she continues to have symptoms      Sensation   Additional Comments pt reports numbness in her axiall      Coordination   Gross Motor Movements are Fluid and Coordinated Yes      Functional Tests   Functional tests Sit to Stand      Sit to Stand   Comments 14 reps in 30 seconds RPE 5/10 with some dyspnea      Posture/Postural Control   Posture/Postural Control Postural limitations    Postural Limitations Rounded Shoulders;Forward head      ROM / Strength   AROM / PROM / Strength AROM;Strength      AROM   Overall AROM  Deficits    Overall AROM Comments pt has assymetical movment with right shoulder and decreased  scapular stabalization with 5 repetitions of shoulder elevation    AROM Assessment Site Shoulder    Right/Left Shoulder Right;Left    Right Shoulder Flexion 150 Degrees    Right Shoulder ABduction 152 Degrees    Right Shoulder Internal Rotation 20 Degrees    Right  Shoulder External Rotation 100 Degrees    Left Shoulder Flexion 180 Degrees    Left Shoulder ABduction 175 Degrees    Left Shoulder Internal Rotation 45 Degrees    Left Shoulder External Rotation 100 Degrees      Strength   Overall Strength Deficits    Overall Strength Comments pt reports her hands are very weak. She is limited by pain with movements of right shoulder    Strength Assessment Site Hand;Shoulder    Right/Left Shoulder Right;Left    Right Shoulder Flexion 3-/5    Right Shoulder ABduction 3-/5    Right Shoulder Internal Rotation 3-/5    Right Shoulder External Rotation 3-/5    Left Shoulder Flexion 4+/5    Left Shoulder ABduction 4+/5    Left Shoulder Internal Rotation 4+/5    Left Shoulder External Rotation 4+/5    Right/Left hand Right;Left    Right Hand Grip (lbs) 7    Left Hand Grip (lbs) 20      Palpation   Palpation comment Pt has muscle tightness with tender trigger points  all around right scapula including upper trap, lateral chest and axilla.               LYMPHEDEMA/ONCOLOGY QUESTIONNAIRE - 11/28/20 0001       Type   Cancer Type right breast cancer      Surgeries   Mastectomy Date 04/29/20      Lymphedema Assessments   Lymphedema Assessments Upper extremities      Right Upper Extremity Lymphedema   15 cm Proximal to Olecranon Process 30.7 cm    10 cm Proximal to Olecranon Process 29.5 cm    Olecranon Process 24.4 cm    15 cm Proximal to Ulnar Styloid Process 25 cm    10 cm Proximal to Ulnar Styloid Process 22.9 cm    Just Proximal to Ulnar Styloid Process 15.5 cm    Across Hand at [employer] 18 cm    At [place] of 2nd Digit 5.7 cm      Left Upper Extremity Lymphedema    15 cm Proximal to Olecranon Process 30 cm    10 cm Proximal to Olecranon Process 28.5 cm    Olecranon Process 25 cm    15 cm Proximal to Ulnar Styloid Process 24 cm    10 cm Proximal to Ulnar Styloid Process 21.5 cm    Just Proximal to Ulnar Styloid Process 15.5 cm    Across Hand at [employer] 17.5 cm    At [place] of 2nd Digit 5.5 cm                     Objective measurements completed on examination: See above findings.                 PT Short Term Goals - 11/28/20 1406       PT SHORT TERM GOAL #1   Title Pt will report that knows how to use her Flexitouch and compression garments to help control the fullness she feels in her right upper quadrant    Time 4    Period Weeks    Status New      PT SHORT TERM GOAL #2   Title Pt will be independent in a basic stregntening program    Time 4    Period Weeks    Status New      PT SHORT TERM GOAL #3   Title Pt will report  that the discomfort she feels in her right upper quadrant at the end of her workday is decreased by 25%               PT Long Term Goals - 11/28/20 1408       PT LONG TERM GOAL #1   Title Pt will report that the pain in her right latera chest is decreased to a 3/5 at the most    Time 8    Period Weeks    Status New      PT LONG TERM GOAL #2   Title Pt will decrase her Quick DASH score to < 20    Time 8    Period Weeks    Status New      PT LONG TERM GOAL #3   Title Pt will increase the grip strength of ther right hand to > 25 pounds    Baseline 7  pounds on 11/28/2020    Status New      PT LONG TERM GOAL #4   Title Pt will be independent in a home exercise program for continued fitness and decreased lymphedema risk    Time 8    Period Weeks    Status New                    Plan - 11/28/20 1347     Clinical Impression Statement Pt comes to PT with frustration that she continues to have pain and feelings of swelling under her right arm after bilateral  mastectomy with implants place 06/23/2020.  She has received lymphedema OT therapy with MLD, circular knit compression sleeve and gauntlet, multiple compression bra styles with extra foam pad and Flexitouch and she continues to have feelings of fullness.  Circumferential measuements show a slight increase in right arm and pt has significant muscel tightness and trigger point tenderness in right scapular area.  She has decreased grip strength in both hands right > left. She did well with 30 second sit to stand demonstrating good lower body strenght and function. There was not enough time to complete eval today( will do closer skin inspection, Quick DASH and SOZO baselines next session.) but pt will benefit from soft tissue work and stretching to right upper quadrant to decrease pain and allow more symmetrical strengthening, assessment of the compression garments pt already has and working with a schedule to optimize their use to control symptoms, and general strengthening to allow her to tolerate work activities better. Email was already sent to [employer] and they will contact pt to work with her to get more effective treatment from their device.    Personal Factors and Comorbidities Comorbidity 3+    Comorbidities right breast cancer, bilateral mastectomy, bilatearl  implant with delayed healing of incision.,    Examination-Activity Limitations Caring for Others;Reach Overhead    Examination-Participation Restrictions Occupation    Stability/Clinical Decision Making Stable/Uncomplicated    Clinical Decision Making Low    Rehab Potential Good    PT Frequency 2x / week    PT Duration 8 weeks    PT Treatment/Interventions ADLs/Self Care Home Management;Electrical Stimulation;Moist Heat;DME Instruction;Therapeutic exercise;Neuromuscular re-education;Orthotic Fit/Training;Patient/family education;Manual techniques;Manual lymph drainage;Compression bandaging;Scar mobilization;Passive range of motion;Dry  needling;Taping;Vasopneumatic Device    PT Next Visit Plan visual inspection of scars, Quick DASH, SOZO, soft tissue work to right scapular area, coordinate sessions with dry needling sessions to optimize pt improvment    Consulted and Agree with Plan of Care Patient  Patient will benefit from skilled therapeutic intervention in order to improve the following deficits and impairments:  Decreased activity tolerance, Decreased endurance, Decreased knowledge of use of DME, Decreased range of motion, Decreased mobility, Decreased strength, Decreased scar mobility, Increased edema, Increased fascial restricitons, Impaired flexibility, Impaired UE functional use, Increased muscle spasms, Postural dysfunction, Pain  Visit Diagnosis: Aftercare following surgery for neoplasm - Plan: PT plan of care cert/re-cert  Muscle weakness (generalized) - Plan: PT plan of care cert/re-cert  Chronic right shoulder pain - Plan: PT plan of care cert/re-cert  Stiffness of right shoulder joint - Plan: PT plan of care cert/re-cert  Postmastectomy lymphedema - Plan: PT plan of care cert/re-cert     Problem List Patient Active Problem List   Diagnosis Date Noted   Breast cancer ([place]) 04/29/2020   Genetic testing 01/14/2020   Ductal carcinoma in situ (DCIS) of right breast 01/02/2020   Donato Heinz. Owens Shark PT  Norwood Levo 11/28/2020, 2:14 PM  [place], [place], [address] Phone: [phone]   Fax:  [phone]  Name: Priscila Tercero MRN: [id number] Date of Birth: [birth year]

## 2020-11-28 NOTE — Patient Instructions (Signed)
Consider [employer]   First of all, check with your insurance company to see if provider is in [place] (for wigs and compression sleeves / gloves/gauntlets )   [address] [place], [place] Phone: [phone]   Fax: [phone] Will file some insurances --- call for appointment   [facility] (for mastectomy prosthetics and garments) [place], [place] [address] Phone: [phone]  Fax: [phone] Will file some insurances --- call for appointment  [facility]  [address]  [place], [place] [address] [phone] Lower extremity garments  [facility] [address] [place], [place]  [address] [facility] (for compression garments, especilly for lower extremities) [address] [place], [place]  [address] [phone] Call for appointment    Jerrol Banana ,certified fitter [facility]  [phone]  [employer] (for mastectomy supplies and garments) [place] [place],  [address] [phone]  Other Resources: National Lymphedema Network:  www.lymphnet.org www.[employer].com for patient articles and self manual lymph drainage information www.[email or web address] has informative articles.  [email or web address]

## 2020-12-03 ENCOUNTER — Ambulatory Visit: Payer: Managed Care, Other (non HMO) | Admitting: Physical Therapy

## 2020-12-03 ENCOUNTER — Other Ambulatory Visit: Payer: Self-pay

## 2020-12-03 DIAGNOSIS — G8929 Other chronic pain: Secondary | ICD-10-CM

## 2020-12-03 DIAGNOSIS — M25611 Stiffness of right shoulder, not elsewhere classified: Secondary | ICD-10-CM

## 2020-12-03 DIAGNOSIS — Z483 Aftercare following surgery for neoplasm: Secondary | ICD-10-CM

## 2020-12-03 DIAGNOSIS — I972 Postmastectomy lymphedema syndrome: Secondary | ICD-10-CM

## 2020-12-03 DIAGNOSIS — M6281 Muscle weakness (generalized): Secondary | ICD-10-CM

## 2020-12-03 NOTE — Therapy (Signed)
[place], [place], [address] Phone: [phone]   Fax:  [phone]  Physical Therapy Treatment  Patient Details  Name: Natalie Cardenas MRN: [id number] Date of Birth: 01/14/1976 Referring Provider (PT): Dr. Donne Hazel   Encounter Date: 12/03/2020   PT End of Session - 12/03/20 1338     Visit Number 2    Number of Visits 17    Date for PT Re-Evaluation 01/28/21    PT Start Time 1205    PT Stop Time 1305    PT Time Calculation (min) 60 min    Activity Tolerance Patient tolerated treatment well    Behavior During Therapy [facility] for tasks assessed/performed             Past Medical History:  Diagnosis Date   Anxiety    Breast cancer ([place])    Depression    Diabetes mellitus without complication ([place])    Thyroid nodule     Past Surgical History:  Procedure Laterality Date   BREAST RECONSTRUCTION WITH PLACEMENT OF TISSUE EXPANDER AND FLEX HD (ACELLULAR HYDRATED DERMIS) Bilateral 04/29/2020   Procedure: IMMEDIATE BILATERAL BREAST RECONSTRUCTION WITH PLACEMENT OF TISSUE EXPANDER AND FLEX HD (ACELLULAR HYDRATED DERMIS);  Surgeon: [name], MD;  Location: [place];  Service: Plastics;  Laterality: Bilateral;   NIPPLE SPARING MASTECTOMY WITH SENTINEL LYMPH NODE BIOPSY Bilateral 04/29/2020   Procedure: BILATERAL NIPPLE SPARING MASTECTOMIES WITH RIGHT AXILLARY SENTINEL LYMPH NODE BIOPSY;  Surgeon: Rolm Bookbinder, MD;  Location: [place];  Service: General;  Laterality: Bilateral;  BILATERAL PEC BLOCK, RNFA   REMOVAL OF BILATERAL TISSUE EXPANDERS WITH PLACEMENT OF BILATERAL BREAST IMPLANTS Bilateral 06/23/2020   Procedure: REMOVAL OF BILATERAL TISSUE EXPANDERS WITH PLACEMENT OF BILATERAL BREAST IMPLANTS;  Surgeon: [name], MD;  Location: [place];  Service: Plastics;  Laterality: Bilateral;  1.5 hours   TONSILLECTOMY      There were no vitals  filed for this visit.   Subjective Assessment - 12/03/20 1210     Subjective Pt states she has a little bit of a burn all the way up to her ear. Pt worked Monday and Tuesday and was ok until Tuesday after lunch and she had a difficult time working because of the pain under her arm. She got a call from [place], but was unable to get in touch with him.    Pertinent History bilateral mastectomy in Apr 29 2020 with removal 2 nodes on the right with immediate exanders. Expanders were removed and implants placed Feb 28. Pt did not have chemo or radiation. She had problems with incision not healing after the implants ( took about 10 weeks). She started developing swelling in her back and under her right arm. She has a pump and has had physical therapy as her feels her range of motion in her shoulder. She has a sleeve and  gauntlet, cirucular knit, She has ordered bras from [employer] [place] and she is uncomfortable in all of them.She has a "cushion" that goes around her back that she got from [employer]    Patient Stated Goals to get rid of the pain under her arm and return to exericse    Currently in Pain? Yes    Pain Score 4     Pain Location Shoulder    Pain Orientation Right    Pain Descriptors / Indicators Burning    Pain Type Chronic pain    Pain Radiating  Towards up to ear and she has her "raw" feeling under her arm                [facility] PT Assessment - 12/03/20 0001       Observation/Other Assessments   Observations Skin inspection today, Well healed incisions below both breast with no open areas, red marks or visible lymphedema though pt states she feels like she has swelling under her arm and a raw place under her arm. She comes in wearing a [employer] with a jovipak with felt over the top to soft the edge that she wears under her arm.  Pt shown the [employer] swell spot as an option and the website for compression guru as an alternative.                L-DEX FLOWSHEETS -  12/03/20 1300       L-DEX LYMPHEDEMA SCREENING   Measurement Type Unilateral    L-DEX MEASUREMENT EXTREMITY Upper Extremity    POSITION  Standing    DOMINANT SIDE Right    At Risk Side Right    BASELINE SCORE (UNILATERAL) -3.4               Quick Dash - 12/03/20 0001     Open a tight or new jar Unable    Do heavy household chores (wash walls, wash floors) Severe difficulty    Carry a shopping bag or briefcase Moderate difficulty    Wash your back Moderate difficulty    Use a knife to cut food Mild difficulty    Recreational activities in which you take some force or impact through your arm, shoulder, or hand (golf, hammering, tennis) Severe difficulty    During the past week, to what extent has your arm, shoulder or hand problem interfered with your normal social activities with family, friends, neighbors, or groups? Modererately    During the past week, to what extent has your arm, shoulder or hand problem limited your work or other regular daily activities Modererately    Arm, shoulder, or hand pain. Severe    Tingling (pins and needles) in your arm, shoulder, or hand Extreme    Difficulty Sleeping Moderate difficulty                    [facility] Adult PT Treatment/Exercise - 12/03/20 0001       Exercises   Exercises Other Exercises    Other Exercises  neck and upper thoracic ROM in sitting as a warm up. 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  Attempted 10 reps of quick left shoudler abduction and it did improve ease of right arm abuction.  Pt may use that to help her ease the pain when she is at work.      Manual Therapy   Manual Therapy Manual Lymphatic Drainage (MLD);Soft tissue mobilization;Myofascial release;Scapular mobilization    Soft tissue mobilization with coco butter to  right pec, upper trap, posterior shoudler and axilla    Myofascial Release prolonged manual pressur to trigger point at posterior axilla with decrase in pain    Scapular Mobilization in left sidelying, inferior glide of scapula with prolonged hold  with relief felt by patient.    Manual Lymphatic Drainage (MLD) briefly: short neck, abdominals,  and right lateral trunk , no significant lymphedema perceived, though pt states she feels the swelling there                      PT Short Term Goals - 12/03/20 1339       PT SHORT TERM GOAL #1   Title Pt will report that knows how to use her Flexitouch and compression garments to help control the fullness she feels in her right upper quadrant    Time 4    Period Weeks    Status New      PT SHORT TERM GOAL #2   Title Pt will be independent in a basic strengthening program    Time 4    Period Weeks    Status New      PT SHORT TERM GOAL #3   Title Pt will report that the discomfort she feels in her right upper quadrant at the end of her workday is decreased by 25%    Time 4    Period Weeks    Status New               PT Long Term Goals - 12/03/20 1339       PT LONG TERM GOAL #1   Title Pt will report that the pain in her right lateral chest is decreased to a 3/10  at the most    Time 8    Period Weeks    Status New      PT LONG TERM GOAL #2   Title Pt will decrase her Quick DASH score to < 40    Baseline 63.64 on 12/03/2020    Time 8    Period Weeks    Status Revised      PT LONG TERM GOAL #3   Title Pt will increase the grip strength of ther right hand to > 25 pounds    Baseline 7  pounds on 11/28/2020    Time 8    Period Weeks    Status New      PT LONG TERM GOAL #4   Title Pt will be independent in a home exercise program for continued fitness and decreased lymphedema risk    Time 8    Period Weeks    Status New                   Plan - 12/03/20 1215     Clinical Impression Statement  completed eval with visual inspection, Quick DASH and SOZO performed. Pt scheduled for 3 month SOZO follow up. Pt brought in 20-30 circular knit compression sleeves that she is wearing at work but unsure if they are helping her. Advised her not to wear them for a while, but continue to wear her [place] bra with foam pad while working.  Feel she might do better with a flat knit sleeve for work as the elastic in the sleeve may be irritating her. She got some initial relief with manual treatment to trigger points and tight muscles  today so anticipate she will benefit from dry needling.  She has several of those appointments scheduled with other visits with [place] when dry neeedling appts not available. We can adjust her visits with [place] for lymphedema monitoring and dry needling visits depending on how pt progresses. Will send an inbasket to Carlus Pavlov and our team  for collaboration.    Personal Factors and Comorbidities Comorbidity 3+    Comorbidities right breast  cancer, bilateral mastectomy, bilatearl  implant with delayed healing of incision.,    Examination-Activity Limitations Caring for Others;Reach Overhead    Examination-Participation Restrictions Occupation    Stability/Clinical Decision Making Stable/Uncomplicated    Rehab Potential Good    PT Frequency 2x / week    PT Duration 8 weeks    PT Treatment/Interventions ADLs/Self Care Home Management;Electrical Stimulation;Moist Heat;DME Instruction;Therapeutic exercise;Neuromuscular re-education;Orthotic Fit/Training;Patient/family education;Manual techniques;Manual lymph drainage;Compression bandaging;Scar mobilization;Passive range of motion;Dry needling;Taping;Vasopneumatic Device    PT Next Visit Plan soft tissue work , myofascial release and mobilization strethcing to right scapular area, coordinate sessions with dry needling sessions to optimize pt improvment    Consulted and Agree with Plan of Care Patient             Patient will benefit  from skilled therapeutic intervention in order to improve the following deficits and impairments:  Decreased activity tolerance, Decreased endurance, Decreased knowledge of use of DME, Decreased range of motion, Decreased mobility, Decreased strength, Decreased scar mobility, Increased edema, Increased fascial restricitons, Impaired flexibility, Impaired UE functional use, Increased muscle spasms, Postural dysfunction, Pain  Visit Diagnosis: Aftercare following surgery for neoplasm  Muscle weakness (generalized)  Chronic right shoulder pain  Stiffness of right shoulder joint  Postmastectomy lymphedema     Problem List Patient Active Problem List   Diagnosis Date Noted   Breast cancer ([place]) 04/29/2020   Genetic testing 01/14/2020   Ductal carcinoma in situ (DCIS) of right breast 01/02/2020   Donato Heinz. Owens Shark PT  Norwood Levo 12/03/2020, 1:42 PM  [place], [place], [address] Phone: [phone]   Fax:  [phone]  Name: Natalie Cardenas MRN: [id number] Date of Birth: 03/20/1976

## 2020-12-10 ENCOUNTER — Ambulatory Visit: Payer: Managed Care, Other (non HMO)

## 2020-12-10 ENCOUNTER — Other Ambulatory Visit: Payer: Self-pay

## 2020-12-10 DIAGNOSIS — Z483 Aftercare following surgery for neoplasm: Secondary | ICD-10-CM

## 2020-12-10 DIAGNOSIS — G8929 Other chronic pain: Secondary | ICD-10-CM

## 2020-12-10 DIAGNOSIS — M6281 Muscle weakness (generalized): Secondary | ICD-10-CM

## 2020-12-10 DIAGNOSIS — I972 Postmastectomy lymphedema syndrome: Secondary | ICD-10-CM

## 2020-12-10 DIAGNOSIS — M25611 Stiffness of right shoulder, not elsewhere classified: Secondary | ICD-10-CM

## 2020-12-10 NOTE — Therapy (Signed)
[place], [place], [address] Phone: [phone]   Fax:  [phone]  Physical Therapy Treatment  Patient Details  Name: Natalie Cardenas MRN: [id number] Date of Birth: June 03, 1975 Referring Provider (PT): Dr. Donne Hazel   Encounter Date: 12/10/2020   PT End of Session - 12/10/20 1122     Visit Number 3    Number of Visits 17    Date for PT Re-Evaluation 01/28/21    PT Start Time 0908   pt arrived late   PT Stop Time 1006    PT Time Calculation (min) 58 min    Activity Tolerance Patient tolerated treatment well    Behavior During Therapy [facility] for tasks assessed/performed             Past Medical History:  Diagnosis Date   Anxiety    Breast cancer ([place])    Depression    Diabetes mellitus without complication ([place])    Thyroid nodule     Past Surgical History:  Procedure Laterality Date   BREAST RECONSTRUCTION WITH PLACEMENT OF TISSUE EXPANDER AND FLEX HD (ACELLULAR HYDRATED DERMIS) Bilateral 04/29/2020   Procedure: IMMEDIATE BILATERAL BREAST RECONSTRUCTION WITH PLACEMENT OF TISSUE EXPANDER AND FLEX HD (ACELLULAR HYDRATED DERMIS);  Surgeon: [name], MD;  Location: [place];  Service: Plastics;  Laterality: Bilateral;   NIPPLE SPARING MASTECTOMY WITH SENTINEL LYMPH NODE BIOPSY Bilateral 04/29/2020   Procedure: BILATERAL NIPPLE SPARING MASTECTOMIES WITH RIGHT AXILLARY SENTINEL LYMPH NODE BIOPSY;  Surgeon: Rolm Bookbinder, MD;  Location: [place];  Service: General;  Laterality: Bilateral;  BILATERAL PEC BLOCK, RNFA   REMOVAL OF BILATERAL TISSUE EXPANDERS WITH PLACEMENT OF BILATERAL BREAST IMPLANTS Bilateral 06/23/2020   Procedure: REMOVAL OF BILATERAL TISSUE EXPANDERS WITH PLACEMENT OF BILATERAL BREAST IMPLANTS;  Surgeon: [name], MD;  Location: [place];  Service: Plastics;  Laterality: Bilateral;  1.5 hours   TONSILLECTOMY      There  were no vitals filed for this visit.   Subjective Assessment - 12/10/20 0925     Subjective I have questions about my insurance covering my appts. I don't know if I can have Cancer Rehab and dry needling at the same day. So I need to look into that.    Pertinent History bilateral mastectomy in Apr 29 2020 with removal 2 nodes on the right with immediate exanders. Expanders were removed and implants placed Feb 28. Pt did not have chemo or radiation. She had problems with incision not healing after the implants ( took about 10 weeks). She started developing swelling in her back and under her right arm. She has a pump and has had physical therapy as her feels her range of motion in her shoulder. She has a sleeve and  gauntlet, cirucular knit, She has ordered bras from [employer] [place] and she is uncomfortable in all of them.She has a "cushion" that goes around her back that she got from [employer] [place]    Patient Stated Goals to get rid of the pain under her arm and return to exericse    Currently in Pain? Yes    Pain Score 4     Pain Location Axilla    Pain Orientation Right    Pain Descriptors / Indicators Sharp   raw   Pain Type Surgical pain    Pain Onset More than a month ago    Pain Frequency Constant    Aggravating Factors  end of the  day is always worse    Pain Relieving Factors just always hurts                               [facility] Adult PT Treatment/Exercise - 12/10/20 0001       Manual Therapy   Soft tissue mobilization In Supine to Rt upper trap, posterior shoulder and axilla; also at latissimus along Rt lateral trunk in Lt S/L    Myofascial Release to Rt axilla at end P/ROMs    Scapular Mobilization In Lt S/L into protraction and retraction; scapular depression throughout P/ROM    Manual Lymphatic Drainage (MLD) In Supine througout session: rt inguinal nodes and Rt axillo-inguinal anasotmosis in supine and Lt S/L                      PT Short  Term Goals - 12/03/20 1339       PT SHORT TERM GOAL #1   Title Pt will report that knows how to use her Flexitouch and compression garments to help control the fullness she feels in her right upper quadrant    Time 4    Period Weeks    Status New      PT SHORT TERM GOAL #2   Title Pt will be independent in a basic strengthening program    Time 4    Period Weeks    Status New      PT SHORT TERM GOAL #3   Title Pt will report that the discomfort she feels in her right upper quadrant at the end of her workday is decreased by 25%    Time 4    Period Weeks    Status New               PT Long Term Goals - 12/03/20 1339       PT LONG TERM GOAL #1   Title Pt will report that the pain in her right lateral chest is decreased to a 3/10  at the most    Time 8    Period Weeks    Status New      PT LONG TERM GOAL #2   Title Pt will decrase her Quick DASH score to < 40    Baseline 63.64 on 12/03/2020    Time 8    Period Weeks    Status Revised      PT LONG TERM GOAL #3   Title Pt will increase the grip strength of ther right hand to > 25 pounds    Baseline 7  pounds on 11/28/2020    Time 8    Period Weeks    Status New      PT LONG TERM GOAL #4   Title Pt will be independent in a home exercise program for continued fitness and decreased lymphedema risk    Time 8    Period Weeks    Status New                   Plan - 12/10/20 1123     Clinical Impression Statement First session today of manual therapy since evaluation. Focused on decreasing fascial restricitons and trigger points at Rt lateral trunk/inferior axilla and then periscapular area. 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 Upon palpation during manual therapy pt has increased tightness throughout RUQ so educated her that increased  hypersensitivity is mostly due to tight musculature plus the increased edema that is still present (combination of post op edema and lymphedema) is worsening the symptoms. Pt able to verbalize good understanding and encouraged to know current symptoms are acute. She has dry needling appt Friday for Rt lateral trunk and possibly Rt scapular area. She may have a few of these appts before returning to Cancer Rehab due to insurance.    Personal Factors and Comorbidities Comorbidity 3+    Comorbidities right breast cancer, bilateral mastectomy, bilatearl  implant with delayed healing of incision.,    Examination-Activity Limitations Caring for Others;Reach Overhead    Examination-Participation Restrictions Occupation    Stability/Clinical Decision Making Stable/Uncomplicated    Rehab Potential Good    PT Frequency 2x / week    PT Duration 8 weeks    PT Treatment/Interventions ADLs/Self Care Home Management;Electrical Stimulation;Moist Heat;DME Instruction;Therapeutic exercise;Neuromuscular re-education;Orthotic Fit/Training;Patient/family education;Manual techniques;Manual lymph drainage;Compression bandaging;Scar mobilization;Passive range of motion;Dry needling;Taping;Vasopneumatic Device    PT Next Visit Plan soft tissue work , myofascial release and mobilization strethcing to right scapular area, coordinate sessions with dry needling sessions to optimize pt improvment    PT Home Exercise Plan End Rt shoulder A/ROM stretching in doorway thorughout her day    Consulted and Agree with Plan of Care Patient             Patient will benefit from skilled therapeutic intervention in order to improve the following deficits and impairments:  Decreased activity tolerance, Decreased endurance, Decreased knowledge of use of DME, Decreased range of motion, Decreased mobility, Decreased strength, Decreased scar mobility, Increased edema, Increased fascial restricitons, Impaired flexibility, Impaired UE  functional use, Increased muscle spasms, Postural dysfunction, Pain  Visit Diagnosis: Aftercare following surgery for neoplasm  Muscle weakness (generalized)  Chronic right shoulder pain  Stiffness of right shoulder joint  Postmastectomy lymphedema     Problem List Patient Active Problem List   Diagnosis Date Noted   Breast cancer ([place]) 04/29/2020   Genetic testing 01/14/2020   Ductal carcinoma in situ (DCIS) of right breast 01/02/2020    Otelia Limes, PTA 12/10/2020, 12:32 PM  [place], [place], [address] Phone: [phone]   Fax:  [phone]  Name: Dayani Oberlander MRN: [id number] Date of Birth: 1976/02/03

## 2020-12-12 ENCOUNTER — Encounter: Payer: Managed Care, Other (non HMO) | Admitting: Physical Therapy

## 2020-12-12 ENCOUNTER — Encounter: Payer: Self-pay | Admitting: Physical Therapy

## 2020-12-12 ENCOUNTER — Other Ambulatory Visit: Payer: Self-pay

## 2020-12-12 ENCOUNTER — Ambulatory Visit: Payer: Managed Care, Other (non HMO) | Admitting: Physical Therapy

## 2020-12-12 DIAGNOSIS — M25611 Stiffness of right shoulder, not elsewhere classified: Secondary | ICD-10-CM

## 2020-12-12 DIAGNOSIS — Z483 Aftercare following surgery for neoplasm: Secondary | ICD-10-CM | POA: Diagnosis not present

## 2020-12-12 DIAGNOSIS — M6281 Muscle weakness (generalized): Secondary | ICD-10-CM

## 2020-12-12 DIAGNOSIS — G8929 Other chronic pain: Secondary | ICD-10-CM

## 2020-12-12 NOTE — Therapy (Signed)
[place], [place], [address] Phone: [phone]   Fax:  [phone]  Physical Therapy Treatment  Patient Details  Name: Natalie Cardenas MRN: [id number] Date of Birth: Aug 15, 1975 Referring Provider (PT): Dr. Donne Hazel   Encounter Date: 12/12/2020   PT End of Session - 12/12/20 0932     Visit Number 4    Number of Visits 17    Date for PT Re-Evaluation 01/28/21    PT Start Time 0932    PT Stop Time 1015    PT Time Calculation (min) 43 min             Past Medical History:  Diagnosis Date   Anxiety    Breast cancer ([place])    Depression    Diabetes mellitus without complication ([place])    Thyroid nodule     Past Surgical History:  Procedure Laterality Date   BREAST RECONSTRUCTION WITH PLACEMENT OF TISSUE EXPANDER AND FLEX HD (ACELLULAR HYDRATED DERMIS) Bilateral 04/29/2020   Procedure: IMMEDIATE BILATERAL BREAST RECONSTRUCTION WITH PLACEMENT OF TISSUE EXPANDER AND FLEX HD (ACELLULAR HYDRATED DERMIS);  Surgeon: [name], MD;  Location: [place];  Service: Plastics;  Laterality: Bilateral;   NIPPLE SPARING MASTECTOMY WITH SENTINEL LYMPH NODE BIOPSY Bilateral 04/29/2020   Procedure: BILATERAL NIPPLE SPARING MASTECTOMIES WITH RIGHT AXILLARY SENTINEL LYMPH NODE BIOPSY;  Surgeon: Rolm Bookbinder, MD;  Location: [place] [facility];  Service: General;  Laterality: Bilateral;  BILATERAL PEC BLOCK, RNFA   REMOVAL OF BILATERAL TISSUE EXPANDERS WITH PLACEMENT OF BILATERAL BREAST IMPLANTS Bilateral 06/23/2020   Procedure: REMOVAL OF BILATERAL TISSUE EXPANDERS WITH PLACEMENT OF BILATERAL BREAST IMPLANTS;  Surgeon: [name], MD;  Location: [place];  Service: Plastics;  Laterality: Bilateral;  1.5 hours   TONSILLECTOMY      There were no vitals filed for this visit.   Subjective Assessment - 12/12/20 0933     Subjective "I am still having pain but in the mornign  my pain levels are low but it increased througout the day. At work it is really tough, I am not full time yet. this also occurs with really any activity."    Patient Stated Goals to get rid of the pain under her arm and return to exericse                [facility] PT Assessment - 12/12/20 0001       Assessment   Medical Diagnosis right breast cancer                           [facility] Adult PT Treatment/Exercise - 12/12/20 0001       Shoulder Exercises: Standing   Other Standing Exercises scapular retration with ER  1 x 10 with RTB      Shoulder Exercises: Stretch   Other Shoulder Stretches upper trap stretch 2 x 30    Other Shoulder Stretches standing R sleeper stretch 2 x 30      Manual Therapy   Manual Therapy Joint mobilization    Manual therapy comments skilled palpation and monitoring of pt throughout TPDN    Joint Mobilization T1-T7 PA grade IV, inferior rib mobs grade III    Soft tissue mobilization IASTM along the R upper trap/ levator scapuale              Trigger Point Dry Needling - 12/12/20 0001     Consent Given? Yes  Education Handout Provided Yes    Muscles Treated Head and Neck Upper trapezius;Levator scapulae    Muscles Treated Upper Quadrant Infraspinatus;Subscapularis    Upper Trapezius Response Twitch reponse elicited;Palpable increased muscle length   R   Levator Scapulae Response Twitch response elicited;Palpable increased muscle length   R   Infraspinatus Response Twitch response elicited;Palpable increased muscle length   R   Subscapularis Response Palpable increased muscle length;Twitch response elicited   R                 PT Education - 12/12/20 0933     Education Details What TPDN is, benefits and what to expect. updated HEP    Person(s) Educated Patient    Methods Explanation;Verbal cues;Handout    Comprehension Verbalized understanding;Verbal cues required              PT Short Term Goals - 12/03/20 1339        PT SHORT TERM GOAL #1   Title Pt will report that knows how to use her Flexitouch and compression garments to help control the fullness she feels in her right upper quadrant    Time 4    Period Weeks    Status New      PT SHORT TERM GOAL #2   Title Pt will be independent in a basic strengthening program    Time 4    Period Weeks    Status New      PT SHORT TERM GOAL #3   Title Pt will report that the discomfort she feels in her right upper quadrant at the end of her workday is decreased by 25%    Time 4    Period Weeks    Status New               PT Long Term Goals - 12/03/20 1339       PT LONG TERM GOAL #1   Title Pt will report that the pain in her right lateral chest is decreased to a 3/10  at the most    Time 8    Period Weeks    Status New      PT LONG TERM GOAL #2   Title Pt will decrase her Quick DASH score to < 40    Baseline 63.64 on 12/03/2020    Time 8    Period Weeks    Status Revised      PT LONG TERM GOAL #3   Title Pt will increase the grip strength of ther right hand to > 25 pounds    Baseline 7  pounds on 11/28/2020    Time 8    Period Weeks    Status New      PT LONG TERM GOAL #4   Title Pt will be independent in a home exercise program for continued fitness and decreased lymphedema risk    Time 8    Period Weeks    Status New                   Plan - 12/12/20 1037     Clinical Impression Statement pt arrives noting R posterior shoulder pain and demosntrates stiffness in the teres minor/ major, infraspinatus. Educated and conset was given for TPDN focusing on the R upper trap, levator scapulae, infrapsinatus, sub-scapularis followed with thoracic mobs. updated HEP for rotator cuff strengtheing with focus on the external rotators and stretching. end of session she noted feeling sore which is likely from the  DN.    PT Treatment/Interventions ADLs/Self Care Home Management;Electrical Stimulation;Moist Heat;DME  Instruction;Therapeutic exercise;Neuromuscular re-education;Orthotic Fit/Training;Patient/family education;Manual techniques;Manual lymph drainage;Compression bandaging;Scar mobilization;Passive range of motion;Dry needling;Taping;Vasopneumatic Device    PT Next Visit Plan soft tissue work , myofascial release and mobilization strethcing to right scapular area, response to DN, GHJ mobs for PA, and postur eeducation    PT Home Exercise Plan End Rt shoulder A/ROM stretching in doorway thorughout her day    Consulted and Agree with Plan of Care Patient             Patient will benefit from skilled therapeutic intervention in order to improve the following deficits and impairments:  Decreased activity tolerance, Decreased endurance, Decreased knowledge of use of DME, Decreased range of motion, Decreased mobility, Decreased strength, Decreased scar mobility, Increased edema, Increased fascial restricitons, Impaired flexibility, Impaired UE functional use, Increased muscle spasms, Postural dysfunction, Pain  Visit Diagnosis: Aftercare following surgery for neoplasm  Muscle weakness (generalized)  Chronic right shoulder pain  Stiffness of right shoulder joint     Problem List Patient Active Problem List   Diagnosis Date Noted   Breast cancer ([place]) 04/29/2020   Genetic testing 01/14/2020   Ductal carcinoma in situ (DCIS) of right breast 01/02/2020   Starr Lake PT, DPT, LAT, ATC  12/12/20  10:42 AM      [facility] [address] [place], [place], [address] Phone: [phone]   Fax:  [phone]  Name: Rakelle Nong MRN: [id number] Date of Birth: 28-Mar-1976

## 2020-12-17 ENCOUNTER — Ambulatory Visit: Payer: Managed Care, Other (non HMO) | Admitting: Physical Therapy

## 2020-12-17 ENCOUNTER — Other Ambulatory Visit: Payer: Self-pay

## 2020-12-17 ENCOUNTER — Encounter: Payer: Managed Care, Other (non HMO) | Admitting: Physical Therapy

## 2020-12-17 DIAGNOSIS — G8929 Other chronic pain: Secondary | ICD-10-CM

## 2020-12-17 DIAGNOSIS — Z483 Aftercare following surgery for neoplasm: Secondary | ICD-10-CM | POA: Diagnosis not present

## 2020-12-17 DIAGNOSIS — M6281 Muscle weakness (generalized): Secondary | ICD-10-CM

## 2020-12-17 DIAGNOSIS — M25611 Stiffness of right shoulder, not elsewhere classified: Secondary | ICD-10-CM

## 2020-12-17 NOTE — Therapy (Signed)
[place], [place], [address] Phone: [phone]   Fax:  [phone]  Physical Therapy Treatment  Patient Details  Name: Natalie Cardenas MRN: [id number] Date of Birth: 04/07/1976 Referring Provider (PT): Dr. Donne Hazel   Encounter Date: 12/17/2020   PT End of Session - 12/17/20 1006     Visit Number 5    Number of Visits 17    Date for PT Re-Evaluation 01/28/21    PT Start Time 0932    PT Stop Time 1016    PT Time Calculation (min) 44 min    Activity Tolerance Patient tolerated treatment well    Behavior During Therapy [facility] for tasks assessed/performed             Past Medical History:  Diagnosis Date   Anxiety    Breast cancer ([place])    Depression    Diabetes mellitus without complication ([place])    Thyroid nodule     Past Surgical History:  Procedure Laterality Date   BREAST RECONSTRUCTION WITH PLACEMENT OF TISSUE EXPANDER AND FLEX HD (ACELLULAR HYDRATED DERMIS) Bilateral 04/29/2020   Procedure: IMMEDIATE BILATERAL BREAST RECONSTRUCTION WITH PLACEMENT OF TISSUE EXPANDER AND FLEX HD (ACELLULAR HYDRATED DERMIS);  Surgeon: [name], MD;  Location: [place];  Service: Plastics;  Laterality: Bilateral;   NIPPLE SPARING MASTECTOMY WITH SENTINEL LYMPH NODE BIOPSY Bilateral 04/29/2020   Procedure: BILATERAL NIPPLE SPARING MASTECTOMIES WITH RIGHT AXILLARY SENTINEL LYMPH NODE BIOPSY;  Surgeon: Rolm Bookbinder, MD;  Location: [place];  Service: General;  Laterality: Bilateral;  BILATERAL PEC BLOCK, RNFA   REMOVAL OF BILATERAL TISSUE EXPANDERS WITH PLACEMENT OF BILATERAL BREAST IMPLANTS Bilateral 06/23/2020   Procedure: REMOVAL OF BILATERAL TISSUE EXPANDERS WITH PLACEMENT OF BILATERAL BREAST IMPLANTS;  Surgeon: [name], MD;  Location: [place];  Service: Plastics;  Laterality: Bilateral;  1.5 hours   TONSILLECTOMY      There were no vitals filed  for this visit.   Subjective Assessment - 12/17/20 0936     Subjective " after I left Friday I felt better that evening I felt much better. Then on Saturday i felt pretty good with some discomfort and sunday it was pretty sore."    Patient Stated Goals to get rid of the pain under her arm and return to exericse    Currently in Pain? Yes    Pain Score 4     Pain Location Axilla    Pain Orientation Right    Pain Descriptors / Indicators Sore;Aching    Pain Type Chronic pain    Pain Onset More than a month ago    Pain Frequency Intermittent    Aggravating Factors  end of the day of work.                [facility] PT Assessment - 12/17/20 0001       Assessment   Medical Diagnosis right breast cancer    Referring Provider (PT) Dr. Laney Potash Adult PT Treatment/Exercise:  Therapeutic Exercise: Lower trap wall y's 1 x 10 Scapular protraction rhythmic stabilization 3 x 30 sec  Manual Therapy: (skilled palpation and monitoring of pt during TPDN)  IASTM along R infrapsinatus, upper trap T2-T6 PA grade III R scapular assist with upward rotation combined with active flexion/ abd.  Trigger Point Dry Needling - 12/17/20 0001     Consent Given? Yes    Education Handout Provided Previously provided    Muscles Treated Upper Quadrant Teres minor;Latissimus dorsi    Upper Trapezius Response Twitch reponse elicited;Palpable increased muscle length   R only   Infraspinatus Response Twitch response elicited;Palpable increased muscle length    Subscapularis Response Twitch response elicited;Palpable increased muscle length   R   Latissimus dorsi Response Twitch response elicited;Palpable increased muscle length   R   Teres minor Response Palpable increased muscle length;Twitch response elicited                  PT Education - 12/17/20 1030     Education Details reviewed and updated HEP    Person(s) Educated Patient    Methods  Explanation;Verbal cues;Handout    Comprehension Verbalized understanding;Verbal cues required              PT Short Term Goals - 12/03/20 1339       PT SHORT TERM GOAL #1   Title Pt will report that knows how to use her Flexitouch and compression garments to help control the fullness she feels in her right upper quadrant    Time 4    Period Weeks    Status New      PT SHORT TERM GOAL #2   Title Pt will be independent in a basic strengthening program    Time 4    Period Weeks    Status New      PT SHORT TERM GOAL #3   Title Pt will report that the discomfort she feels in her right upper quadrant at the end of her workday is decreased by 25%    Time 4    Period Weeks    Status New               PT Long Term Goals - 12/03/20 1339       PT LONG TERM GOAL #1   Title Pt will report that the pain in her right lateral chest is decreased to a 3/10  at the most    Time 8    Period Weeks    Status New      PT LONG TERM GOAL #2   Title Pt will decrase her Quick DASH score to < 40    Baseline 63.64 on 12/03/2020    Time 8    Period Weeks    Status Revised      PT LONG TERM GOAL #3   Title Pt will increase the grip strength of ther right hand to > 25 pounds    Baseline 7  pounds on 11/28/2020    Time 8    Period Weeks    Status New      PT LONG TERM GOAL #4   Title Pt will be independent in a home exercise program for continued fitness and decreased lymphedema risk    Time 8    Period Weeks    Status New                  Plan - 12/17/20 1011     Clinical Impression Statement pt arrives reporting some improvement immediately following her last session but noted after a few days the pain gradually returned. continued TPDN working on posterior shoulder musculature followed with scapular strengthening with emphasis for scapulohumeral rhythm. end of session performed repeated shoulder lfexion / abduction which she noted relief of  pain/ stiffness.    PT  Treatment/Interventions ADLs/Self Care Home Management;Electrical Stimulation;Moist Heat;DME Instruction;Therapeutic exercise;Neuromuscular re-education;Orthotic Fit/Training;Patient/family education;Manual techniques;Manual lymph drainage;Compression bandaging;Scar mobilization;Passive range of motion;Dry needling;Taping;Vasopneumatic Device    PT Next Visit Plan soft tissue work , myofascial release and mobilization strethcing to right scapular area, response to DN, GHJ mobs for PA, and postur eeducation, scapulohumeral rhythm.    PT Home Exercise Plan End Rt shoulder A/ROM stretching in doorway thorughout her day -  [id number]    Consulted and Agree with Plan of Care Patient             Patient will benefit from skilled therapeutic intervention in order to improve the following deficits and impairments:  Decreased activity tolerance, Decreased endurance, Decreased knowledge of use of DME, Decreased range of motion, Decreased mobility, Decreased strength, Decreased scar mobility, Increased edema, Increased fascial restricitons, Impaired flexibility, Impaired UE functional use, Increased muscle spasms, Postural dysfunction, Pain  Visit Diagnosis: Muscle weakness (generalized)  Chronic right shoulder pain  Stiffness of right shoulder joint     Problem List Patient Active Problem List   Diagnosis Date Noted   Breast cancer ([place]) 04/29/2020   Genetic testing 01/14/2020   Ductal carcinoma in situ (DCIS) of right breast 01/02/2020   Starr Lake PT, DPT, LAT, ATC  12/17/20  10:32 AM      [facility] [address] [place], [place], [address] Phone: [phone]   Fax:  [phone]  Name: Aquilla Labore MRN: [id number] Date of Birth: Apr 07, 1976

## 2020-12-19 ENCOUNTER — Other Ambulatory Visit: Payer: Self-pay

## 2020-12-19 ENCOUNTER — Ambulatory Visit: Payer: Managed Care, Other (non HMO) | Admitting: Physical Therapy

## 2020-12-19 ENCOUNTER — Encounter: Payer: Managed Care, Other (non HMO) | Admitting: Rehabilitation

## 2020-12-19 DIAGNOSIS — M25611 Stiffness of right shoulder, not elsewhere classified: Secondary | ICD-10-CM

## 2020-12-19 DIAGNOSIS — Z483 Aftercare following surgery for neoplasm: Secondary | ICD-10-CM

## 2020-12-19 DIAGNOSIS — M6281 Muscle weakness (generalized): Secondary | ICD-10-CM

## 2020-12-19 DIAGNOSIS — M25511 Pain in right shoulder: Secondary | ICD-10-CM

## 2020-12-19 DIAGNOSIS — I972 Postmastectomy lymphedema syndrome: Secondary | ICD-10-CM

## 2020-12-19 DIAGNOSIS — G8929 Other chronic pain: Secondary | ICD-10-CM

## 2020-12-19 NOTE — Therapy (Signed)
[place], [place], [address] Phone: [phone]   Fax:  [phone]  Physical Therapy Treatment  Patient Details  Name: Natalie Cardenas MRN: [id number] Date of Birth: 26-Aug-1975 Referring Provider (PT): Dr. Donne Hazel   Encounter Date: 12/19/2020   PT End of Session - 12/19/20 0819     Visit Number 6    Number of Visits 17    Date for PT Re-Evaluation 01/28/21    PT Start Time 0819   pt stuck in traffic   PT Stop Time 0905    PT Time Calculation (min) 46 min    Activity Tolerance Patient tolerated treatment well    Behavior During Therapy [facility] for tasks assessed/performed             Past Medical History:  Diagnosis Date   Anxiety    Breast cancer ([place])    Depression    Diabetes mellitus without complication ([place])    Thyroid nodule     Past Surgical History:  Procedure Laterality Date   BREAST RECONSTRUCTION WITH PLACEMENT OF TISSUE EXPANDER AND FLEX HD (ACELLULAR HYDRATED DERMIS) Bilateral 04/29/2020   Procedure: IMMEDIATE BILATERAL BREAST RECONSTRUCTION WITH PLACEMENT OF TISSUE EXPANDER AND FLEX HD (ACELLULAR HYDRATED DERMIS);  Surgeon: [name], MD;  Location: [place];  Service: Plastics;  Laterality: Bilateral;   NIPPLE SPARING MASTECTOMY WITH SENTINEL LYMPH NODE BIOPSY Bilateral 04/29/2020   Procedure: BILATERAL NIPPLE SPARING MASTECTOMIES WITH RIGHT AXILLARY SENTINEL LYMPH NODE BIOPSY;  Surgeon: Rolm Bookbinder, MD;  Location: [place];  Service: General;  Laterality: Bilateral;  BILATERAL PEC BLOCK, RNFA   REMOVAL OF BILATERAL TISSUE EXPANDERS WITH PLACEMENT OF BILATERAL BREAST IMPLANTS Bilateral 06/23/2020   Procedure: REMOVAL OF BILATERAL TISSUE EXPANDERS WITH PLACEMENT OF BILATERAL BREAST IMPLANTS;  Surgeon: [name], MD;  Location: [place];  Service: Plastics;  Laterality: Bilateral;  1.5 hours   TONSILLECTOMY       There were no vitals filed for this visit.   Subjective Assessment - 12/19/20 0821     Subjective Pt was stuck in traffic this morning.  She has had some results from the dry needling but the pain returns.  She has been following up with the exercises she has been doing.    Pertinent History bilateral mastectomy in Apr 29 2020 with removal 2 nodes on the right with immediate exanders. Expanders were removed and implants placed Feb 28. Pt did not have chemo or radiation. She had problems with incision not healing after the implants ( took about 10 weeks). She started developing swelling in her back and under her right arm. She has a pump and has had physical therapy as her feels her range of motion in her shoulder. She has a sleeve and  gauntlet, cirucular knit, She has ordered bras from [employer] [place] and she is uncomfortable in all of them.She has a "cushion" that goes around her back that she got from [employer] [place]    Patient Stated Goals to get rid of the pain under her arm and return to exericse    Currently in Pain? Yes    Pain Score --   did not rate, but she states pain has come back some   Pain Radiating Towards "raw feeling" under her arm and burning at lateral scapula with pressure    Pain Onset More than a month ago    Pain Frequency Intermittent  [facility] Adult PT Treatment/Exercise - 12/19/20 0001       Manual Therapy   Manual Therapy Soft tissue mobilization;Myofascial release;Scapular mobilization;Manual Traction    Soft tissue mobilization with cocoa butter n Supine and left sidlying  to Rt upper trap, posterior shoulder and axilla; also at latissimus along Rt lateral trunk    Myofascial Release to cervcial and scapular area    Scapular Mobilization In Lt S/L into protraction and retraction; scapular depression throughout P/ROM inferior glide, not able to get release of scpaulae off posterior chest wall with right arm  internal rotation    Manual Traction gently to cervical area with myofascial release                      PT Short Term Goals - 12/03/20 1339       PT SHORT TERM GOAL #1   Title Pt will report that knows how to use her Flexitouch and compression garments to help control the fullness she feels in her right upper quadrant    Time 4    Period Weeks    Status New      PT SHORT TERM GOAL #2   Title Pt will be independent in a basic strengthening program    Time 4    Period Weeks    Status New      PT SHORT TERM GOAL #3   Title Pt will report that the discomfort she feels in her right upper quadrant at the end of her workday is decreased by 25%    Time 4    Period Weeks    Status New               PT Long Term Goals - 12/03/20 1339       PT LONG TERM GOAL #1   Title Pt will report that the pain in her right lateral chest is decreased to a 3/10  at the most    Time 8    Period Weeks    Status New      PT LONG TERM GOAL #2   Title Pt will decrase her Quick DASH score to < 40    Baseline 63.64 on 12/03/2020    Time 8    Period Weeks    Status Revised      PT LONG TERM GOAL #3   Title Pt will increase the grip strength of ther right hand to > 25 pounds    Baseline 7  pounds on 11/28/2020    Time 8    Period Weeks    Status New      PT LONG TERM GOAL #4   Title Pt will be independent in a home exercise program for continued fitness and decreased lymphedema risk    Time 8    Period Weeks    Status New                   Plan - 12/19/20 0916     Clinical Impression Statement Pt had significant trigger points in neck and posterior shoulder includeing posterior axilla She frequently gets burning, raw feeling types of pain and heaviness in her arm.  She did have some softening of tight muscle areas with soft tissue work today. Pt was given information about yoga classes through [employer] and feels like she might be able to try them  Pt wil  continue to do the exercises she was given    Personal Factors  and Comorbidities Comorbidity 3+    Comorbidities right breast cancer, bilateral mastectomy, bilatearl  implant with delayed healing of incision.,    Examination-Activity Limitations Caring for Others;Reach Overhead    Stability/Clinical Decision Making Stable/Uncomplicated    Clinical Decision Making Low    Rehab Potential Good    PT Frequency 2x / week    PT Duration 8 weeks    PT Treatment/Interventions ADLs/Self Care Home Management;Electrical Stimulation;Moist Heat;DME Instruction;Therapeutic exercise;Neuromuscular re-education;Orthotic Fit/Training;Patient/family education;Manual techniques;Manual lymph drainage;Compression bandaging;Scar mobilization;Passive range of motion;Dry needling;Taping;Vasopneumatic Device    PT Next Visit Plan soft tissue work , myofascial release and mobilization strethcing to right scapular area, response to DN, GHJ mobs for PA, and postur eeducation, scapulohumeral rhythm.    PT Home Exercise Plan End Rt shoulder A/ROM stretching in doorway thorughout her day -  [id number]             Patient will benefit from skilled therapeutic intervention in order to improve the following deficits and impairments:  Decreased activity tolerance, Decreased endurance, Decreased knowledge of use of DME, Decreased range of motion, Decreased mobility, Decreased strength, Decreased scar mobility, Increased edema, Increased fascial restricitons, Impaired flexibility, Impaired UE functional use, Increased muscle spasms, Postural dysfunction, Pain  Visit Diagnosis: Muscle weakness (generalized)  Chronic right shoulder pain  Stiffness of right shoulder joint  Aftercare following surgery for neoplasm  Postmastectomy lymphedema     Problem List Patient Active Problem List   Diagnosis Date Noted   Breast cancer ([place]) 04/29/2020   Genetic testing 01/14/2020   Ductal carcinoma in situ (DCIS) of right breast  01/02/2020   Donato Heinz. Owens Shark PT  Norwood Levo 12/19/2020, 9:31 AM  [place], [place], [address] Phone: [phone]   Fax:  [phone]  Name: Natalie Cardenas MRN: [id number] Date of Birth: 18-Jun-1975

## 2020-12-24 ENCOUNTER — Ambulatory Visit: Payer: Managed Care, Other (non HMO) | Admitting: Physical Therapy

## 2020-12-24 ENCOUNTER — Other Ambulatory Visit: Payer: Self-pay

## 2020-12-24 ENCOUNTER — Encounter: Payer: Managed Care, Other (non HMO) | Admitting: Physical Therapy

## 2020-12-24 ENCOUNTER — Encounter: Payer: Self-pay | Admitting: Physical Therapy

## 2020-12-24 DIAGNOSIS — G8929 Other chronic pain: Secondary | ICD-10-CM

## 2020-12-24 DIAGNOSIS — Z483 Aftercare following surgery for neoplasm: Secondary | ICD-10-CM | POA: Diagnosis not present

## 2020-12-24 DIAGNOSIS — M25611 Stiffness of right shoulder, not elsewhere classified: Secondary | ICD-10-CM

## 2020-12-24 DIAGNOSIS — M6281 Muscle weakness (generalized): Secondary | ICD-10-CM

## 2020-12-24 NOTE — Therapy (Signed)
[place], [place], [address] Phone: [phone]   Fax:  [phone]  Physical Therapy Treatment  Patient Details  Name: Natalie Cardenas MRN: [id number] Date of Birth: 08-15-1975 Referring Provider (PT): Dr. Donne Hazel   Encounter Date: 12/24/2020   PT End of Session - 12/24/20 1023     Visit Number 7    Number of Visits 17    Date for PT Re-Evaluation 01/28/21    PT Start Time 1022   pt arrived late   PT Stop Time 1102    PT Time Calculation (min) 40 min    Activity Tolerance Patient tolerated treatment well    Behavior During Therapy [facility] for tasks assessed/performed             Past Medical History:  Diagnosis Date   Anxiety    Breast cancer ([place])    Depression    Diabetes mellitus without complication ([place])    Thyroid nodule     Past Surgical History:  Procedure Laterality Date   BREAST RECONSTRUCTION WITH PLACEMENT OF TISSUE EXPANDER AND FLEX HD (ACELLULAR HYDRATED DERMIS) Bilateral 04/29/2020   Procedure: IMMEDIATE BILATERAL BREAST RECONSTRUCTION WITH PLACEMENT OF TISSUE EXPANDER AND FLEX HD (ACELLULAR HYDRATED DERMIS);  Surgeon: [name], MD;  Location: [place];  Service: Plastics;  Laterality: Bilateral;   NIPPLE SPARING MASTECTOMY WITH SENTINEL LYMPH NODE BIOPSY Bilateral 04/29/2020   Procedure: BILATERAL NIPPLE SPARING MASTECTOMIES WITH RIGHT AXILLARY SENTINEL LYMPH NODE BIOPSY;  Surgeon: Rolm Bookbinder, MD;  Location: [place];  Service: General;  Laterality: Bilateral;  BILATERAL PEC BLOCK, RNFA   REMOVAL OF BILATERAL TISSUE EXPANDERS WITH PLACEMENT OF BILATERAL BREAST IMPLANTS Bilateral 06/23/2020   Procedure: REMOVAL OF BILATERAL TISSUE EXPANDERS WITH PLACEMENT OF BILATERAL BREAST IMPLANTS;  Surgeon: [name], MD;  Location: [place];  Service: Plastics;  Laterality: Bilateral;  1.5 hours   TONSILLECTOMY      There were  no vitals filed for this visit.   Subjective Assessment - 12/24/20 1024     Subjective " I am still getting that issue when I am at work, by the end of Tuesday i am in alot of pain."    Pertinent History bilateral mastectomy in Apr 29 2020 with removal 2 nodes on the right with immediate exanders. Expanders were removed and implants placed Feb 28. Pt did not have chemo or radiation. She had problems with incision not healing after the implants ( took about 10 weeks). She started developing swelling in her back and under her right arm. She has a pump and has had physical therapy as her feels her range of motion in her shoulder. She has a sleeve and  gauntlet, cirucular knit, She has ordered bras from [employer] [place] and she is uncomfortable in all of them.She has a "cushion" that goes around her back that she got from [employer] [place]    Currently in Pain? Yes    Pain Score --   trpouble rating but at worst 10/10   Pain Location Axilla    Pain Orientation Right    Pain Descriptors / Indicators Aching    Pain Type Chronic pain    Pain Onset More than a month ago    Pain Frequency Intermittent    Aggravating Factors  end of the day at work                [facility] PT Assessment - 12/24/20 0001  Assessment   Medical Diagnosis right breast cancer                           [facility] Adult PT Treatment/Exercise - 12/24/20 0001       Shoulder Exercises: Supine   Protraction Right;Strengthening   rhymic tsabilization 3 x 30 sec   Other Supine Exercises money 2 x 15 with BTB      Manual Therapy   Manual Therapy Other (comment)    Manual therapy comments skilled palpation and monitoring of pt throughout TPDN    Other Manual Therapy desensitization along R lateral axilla              Trigger Point Dry Needling - 12/24/20 0001     Consent Given? Yes    Education Handout Provided Previously provided    Muscles Treated Upper Quadrant Triceps    Upper Trapezius  Response Twitch reponse elicited;Palpable increased muscle length   L   Latissimus dorsi Response Twitch response elicited;Palpable increased muscle length   L   Teres minor Response Twitch response elicited;Palpable increased muscle length   L   Triceps Response Twitch response elicited;Palpable increased muscle length   L                   PT Short Term Goals - 12/03/20 1339       PT SHORT TERM GOAL #1   Title Pt will report that knows how to use her Flexitouch and compression garments to help control the fullness she feels in her right upper quadrant    Time 4    Period Weeks    Status New      PT SHORT TERM GOAL #2   Title Pt will be independent in a basic strengthening program    Time 4    Period Weeks    Status New      PT SHORT TERM GOAL #3   Title Pt will report that the discomfort she feels in her right upper quadrant at the end of her workday is decreased by 25%    Time 4    Period Weeks    Status New               PT Long Term Goals - 12/03/20 1339       PT LONG TERM GOAL #1   Title Pt will report that the pain in her right lateral chest is decreased to a 3/10  at the most    Time 8    Period Weeks    Status New      PT LONG TERM GOAL #2   Title Pt will decrase her Quick DASH score to < 40    Baseline 63.64 on 12/03/2020    Time 8    Period Weeks    Status Revised      PT LONG TERM GOAL #3   Title Pt will increase the grip strength of ther right hand to > 25 pounds    Baseline 7  pounds on 11/28/2020    Time 8    Period Weeks    Status New      PT LONG TERM GOAL #4   Title Pt will be independent in a home exercise program for continued fitness and decreased lymphedema risk    Time 8    Period Weeks    Status New  Plan - 12/24/20 1117     Clinical Impression Statement pt arrives to session reporting increased frustration regarding her current situation with soreness/ pain especially with work. Continued  working on [employer] for the R shoulder muscualture, trialed desensitization for the R shoulder / axilla which she noted decreased pain following treatment. continued working scapular stability and shoulder ER with increased reps to promote endurnace. end of session she noted soreness in the upper trap but otherwise noted feeling better.    PT Treatment/Interventions ADLs/Self Care Home Management;Electrical Stimulation;Moist Heat;DME Instruction;Therapeutic exercise;Neuromuscular re-education;Orthotic Fit/Training;Patient/family education;Manual techniques;Manual lymph drainage;Compression bandaging;Scar mobilization;Passive range of motion;Dry needling;Taping;Vasopneumatic Device    PT Next Visit Plan soft tissue work , myofascial release and mobilization strethcing to right scapular area, response to DN, GHJ mobs for PA, and postur eeducation, scapulohumeral rhythm.    PT Home Exercise Plan End Rt shoulder A/ROM stretching in doorway thorughout her day -  [id number]    Consulted and Agree with Plan of Care Patient             Patient will benefit from skilled therapeutic intervention in order to improve the following deficits and impairments:  Decreased activity tolerance, Decreased endurance, Decreased knowledge of use of DME, Decreased range of motion, Decreased mobility, Decreased strength, Decreased scar mobility, Increased edema, Increased fascial restricitons, Impaired flexibility, Impaired UE functional use, Increased muscle spasms, Postural dysfunction, Pain  Visit Diagnosis: Muscle weakness (generalized)  Chronic right shoulder pain  Stiffness of right shoulder joint     Problem List Patient Active Problem List   Diagnosis Date Noted   Breast cancer ([place]) 04/29/2020   Genetic testing 01/14/2020   Ductal carcinoma in situ (DCIS) of right breast 01/02/2020   Starr Lake PT, DPT, LAT, ATC  12/24/20  11:21 AM      [facility] [address] [place], [place], [address] Phone: [phone]   Fax:  [phone]  Name: Brynnlea Snowden MRN: [id number] Date of Birth: 24-Jul-1975

## 2020-12-26 ENCOUNTER — Ambulatory Visit: Payer: Managed Care, Other (non HMO) | Admitting: Rehabilitation

## 2020-12-31 ENCOUNTER — Ambulatory Visit: Payer: Managed Care, Other (non HMO) | Attending: General Surgery

## 2020-12-31 ENCOUNTER — Other Ambulatory Visit: Payer: Self-pay

## 2020-12-31 DIAGNOSIS — M25511 Pain in right shoulder: Secondary | ICD-10-CM | POA: Diagnosis present

## 2020-12-31 DIAGNOSIS — Z483 Aftercare following surgery for neoplasm: Secondary | ICD-10-CM | POA: Diagnosis present

## 2020-12-31 DIAGNOSIS — M6281 Muscle weakness (generalized): Secondary | ICD-10-CM | POA: Diagnosis present

## 2020-12-31 DIAGNOSIS — I972 Postmastectomy lymphedema syndrome: Secondary | ICD-10-CM | POA: Insufficient documentation

## 2020-12-31 DIAGNOSIS — G8929 Other chronic pain: Secondary | ICD-10-CM | POA: Insufficient documentation

## 2020-12-31 DIAGNOSIS — M25611 Stiffness of right shoulder, not elsewhere classified: Secondary | ICD-10-CM | POA: Insufficient documentation

## 2020-12-31 NOTE — Therapy (Signed)
[place], [place], [address] Phone: [phone]   Fax:  [phone]  Physical Therapy Treatment  Patient Details  Name: Natalie Cardenas MRN: [id number] Date of Birth: 12-25-1975 Referring Provider (PT): Dr. Donne Hazel   Encounter Date: 12/31/2020   PT End of Session - 12/31/20 1207     Visit Number 8    Number of Visits 17    Date for PT Re-Evaluation 01/28/21    PT Start Time 1109    PT Stop Time 1208    PT Time Calculation (min) 59 min    Activity Tolerance Patient tolerated treatment well    Behavior During Therapy [facility] for tasks assessed/performed             Past Medical History:  Diagnosis Date   Anxiety    Breast cancer ([place])    Depression    Diabetes mellitus without complication ([place])    Thyroid nodule     Past Surgical History:  Procedure Laterality Date   BREAST RECONSTRUCTION WITH PLACEMENT OF TISSUE EXPANDER AND FLEX HD (ACELLULAR HYDRATED DERMIS) Bilateral 04/29/2020   Procedure: IMMEDIATE BILATERAL BREAST RECONSTRUCTION WITH PLACEMENT OF TISSUE EXPANDER AND FLEX HD (ACELLULAR HYDRATED DERMIS);  Surgeon: [name], MD;  Location: [place];  Service: Plastics;  Laterality: Bilateral;   NIPPLE SPARING MASTECTOMY WITH SENTINEL LYMPH NODE BIOPSY Bilateral 04/29/2020   Procedure: BILATERAL NIPPLE SPARING MASTECTOMIES WITH RIGHT AXILLARY SENTINEL LYMPH NODE BIOPSY;  Surgeon: Rolm Bookbinder, MD;  Location: [place];  Service: General;  Laterality: Bilateral;  BILATERAL PEC BLOCK, RNFA   REMOVAL OF BILATERAL TISSUE EXPANDERS WITH PLACEMENT OF BILATERAL BREAST IMPLANTS Bilateral 06/23/2020   Procedure: REMOVAL OF BILATERAL TISSUE EXPANDERS WITH PLACEMENT OF BILATERAL BREAST IMPLANTS;  Surgeon: [name], MD;  Location: [place];  Service: Plastics;  Laterality: Bilateral;  1.5 hours   TONSILLECTOMY      There were no vitals filed  for this visit.   Subjective Assessment - 12/31/20 1117     Subjective Overall I do feel like I am getting alot better. Yesterday I had no pain or discomfort all day and my pain was only like 0-1/10 and today I'm just feeling sore at my Rt trunk. And I found a goat's milk lotion at the CBD store and I used it over the weekend and 3x/day yesterday and that seemed to help as well. It's for swelling and arthritis so has just seemed to help alot with whatever I have going on.    Pertinent History bilateral mastectomy in Apr 29 2020 with removal 2 nodes on the right with immediate exanders. Expanders were removed and implants placed Feb 28. Pt did not have chemo or radiation. She had problems with incision not healing after the implants ( took about 10 weeks). She started developing swelling in her back and under her right arm. She has a pump and has had physical therapy as her feels her range of motion in her shoulder. She has a sleeve and  gauntlet, cirucular knit, She has ordered bras from [employer] [place] and she is uncomfortable in all of them.She has a "cushion" that goes around her back that she got from [employer] [place]    Patient Stated Goals to get rid of the pain under her arm and return to exericse    Currently in Pain? Yes    Pain Score 3     Pain Location Flank  Pain Orientation Right    Pain Descriptors / Indicators Sore    Pain Type Chronic pain    Pain Onset More than a month ago    Pain Frequency Intermittent    Aggravating Factors  end of the work day    Pain Relieving Factors stretching and trying a new lotion for swelling                               [facility] Adult PT Treatment/Exercise - 12/31/20 0001       Manual Therapy   Soft tissue mobilization with cocoa butter In Supine and left sidelying  to Rt upper trap, posterior shoulder and axilla; also at latissimus along Rt lateral trunk    Scapular Mobilization In Lt S/L into protraction and retraction;  scapular depression throughout P/ROM inferior glide    Manual Traction --                  Upper Extremity Functional Index Score :   /80     PT Short Term Goals - 12/03/20 1339       PT SHORT TERM GOAL #1   Title Pt will report that knows how to use her Flexitouch and compression garments to help control the fullness she feels in her right upper quadrant    Time 4    Period Weeks    Status New      PT SHORT TERM GOAL #2   Title Pt will be independent in a basic strengthening program    Time 4    Period Weeks    Status New      PT SHORT TERM GOAL #3   Title Pt will report that the discomfort she feels in her right upper quadrant at the end of her workday is decreased by 25%    Time 4    Period Weeks    Status New               PT Long Term Goals - 12/03/20 1339       PT LONG TERM GOAL #1   Title Pt will report that the pain in her right lateral chest is decreased to a 3/10  at the most    Time 8    Period Weeks    Status New      PT LONG TERM GOAL #2   Title Pt will decrase her Quick DASH score to < 40    Baseline 63.64 on 12/03/2020    Time 8    Period Weeks    Status Revised      PT LONG TERM GOAL #3   Title Pt will increase the grip strength of ther right hand to > 25 pounds    Baseline 7  pounds on 11/28/2020    Time 8    Period Weeks    Status New      PT LONG TERM GOAL #4   Title Pt will be independent in a home exercise program for continued fitness and decreased lymphedema risk    Time 8    Period Weeks    Status New                   Plan - 12/31/20 1211     Clinical Impression Statement Pt reports this has been a much better week so far for her. Yesterday she reports very little to no discomfort during and at  end of her work day. She also went for a walk after work as her endurance felt good as well. Today she reports some mild soreness with feeling of fullness at her Rt trunk/inferior axillary area so focused adding  manual lymph drainage here today intermittently during manual therapy session today. Educated her to add "snow angel" strtch (bil abduction) in supine and standing to help with end ROM pectoralis stretch as she reports this seems most helpful along with new exercises she was issued at last session. Pt is very pleased with how well she is feeling this week and hopeful this will continue. Encouraged her to cont stretching daily and cont PT including dry needling for now as overall this seems to be cohesivley working to decrease her muscle soreness and discomfort. She verbalized understanding and agreed.    Personal Factors and Comorbidities Comorbidity 3+    Comorbidities right breast cancer, bilateral mastectomy, bilatearl  implant with delayed healing of incision.,    Examination-Activity Limitations Caring for Others;Reach Overhead    Examination-Participation Restrictions Occupation    Stability/Clinical Decision Making Stable/Uncomplicated    Rehab Potential Good    PT Frequency 2x / week    PT Duration 8 weeks    PT Treatment/Interventions ADLs/Self Care Home Management;Electrical Stimulation;Moist Heat;DME Instruction;Therapeutic exercise;Neuromuscular re-education;Orthotic Fit/Training;Patient/family education;Manual techniques;Manual lymph drainage;Compression bandaging;Scar mobilization;Passive range of motion;Dry needling;Taping;Vasopneumatic Device    PT Next Visit Plan soft tissue work , myofascial release and mobilization strethcing to right scapular area, response to DN, GHJ mobs for PA, and posture education, scapulohumeral rhythm.    PT Home Exercise Plan End Rt shoulder A/ROM stretching in doorway throughout her day -  [id number]; "snow angel" for bil abduction in supine and standing with back against wall    Consulted and Agree with Plan of Care Patient             Patient will benefit from skilled therapeutic intervention in order to improve the following deficits and impairments:   Decreased activity tolerance, Decreased endurance, Decreased knowledge of use of DME, Decreased range of motion, Decreased mobility, Decreased strength, Decreased scar mobility, Increased edema, Increased fascial restricitons, Impaired flexibility, Impaired UE functional use, Increased muscle spasms, Postural dysfunction, Pain  Visit Diagnosis: Muscle weakness (generalized)  Chronic right shoulder pain  Stiffness of right shoulder joint  Aftercare following surgery for neoplasm  Postmastectomy lymphedema     Problem List Patient Active Problem List   Diagnosis Date Noted   Breast cancer ([place]) 04/29/2020   Genetic testing 01/14/2020   Ductal carcinoma in situ (DCIS) of right breast 01/02/2020    Otelia Limes, PTA 12/31/2020, 1:29 PM  [place], [place], [address] Phone: [phone]   Fax:  [phone]  Name: Natalie Cardenas MRN: [id number] Date of Birth: 08/31/1975

## 2021-01-02 ENCOUNTER — Ambulatory Visit: Payer: Managed Care, Other (non HMO) | Admitting: Physical Therapy

## 2021-01-02 ENCOUNTER — Other Ambulatory Visit: Payer: Self-pay

## 2021-01-02 ENCOUNTER — Encounter: Payer: Self-pay | Admitting: Physical Therapy

## 2021-01-02 DIAGNOSIS — G8929 Other chronic pain: Secondary | ICD-10-CM

## 2021-01-02 DIAGNOSIS — M6281 Muscle weakness (generalized): Secondary | ICD-10-CM | POA: Diagnosis not present

## 2021-01-02 DIAGNOSIS — M25611 Stiffness of right shoulder, not elsewhere classified: Secondary | ICD-10-CM

## 2021-01-02 NOTE — Therapy (Signed)
[place], [place], [address] Phone: [phone]   Fax:  [phone]  Physical Therapy Treatment  Patient Details  Name: Natalie Cardenas MRN: [id number] Date of Birth: [birth year] Referring Provider (PT): Dr. Donne Hazel   Encounter Date: 01/02/2021   PT End of Session - 01/02/21 1104     Visit Number 9    Number of Visits 17    Date for PT Re-Evaluation 01/28/21    PT Start Time 1102    PT Stop Time 1150    PT Time Calculation (min) 48 min    Activity Tolerance Patient tolerated treatment well    Behavior During Therapy [facility] for tasks assessed/performed             Past Medical History:  Diagnosis Date   Anxiety    Breast cancer ([place])    Depression    Diabetes mellitus without complication ([place])    Thyroid nodule     Past Surgical History:  Procedure Laterality Date   BREAST RECONSTRUCTION WITH PLACEMENT OF TISSUE EXPANDER AND FLEX HD (ACELLULAR HYDRATED DERMIS) Bilateral 04/29/2020   Procedure: IMMEDIATE BILATERAL BREAST RECONSTRUCTION WITH PLACEMENT OF TISSUE EXPANDER AND FLEX HD (ACELLULAR HYDRATED DERMIS);  Surgeon: [name], MD;  Location: [place];  Service: Plastics;  Laterality: Bilateral;   NIPPLE SPARING MASTECTOMY WITH SENTINEL LYMPH NODE BIOPSY Bilateral 04/29/2020   Procedure: BILATERAL NIPPLE SPARING MASTECTOMIES WITH RIGHT AXILLARY SENTINEL LYMPH NODE BIOPSY;  Surgeon: Rolm Bookbinder, MD;  Location: [place];  Service: General;  Laterality: Bilateral;  BILATERAL PEC BLOCK, RNFA   REMOVAL OF BILATERAL TISSUE EXPANDERS WITH PLACEMENT OF BILATERAL BREAST IMPLANTS Bilateral 06/23/2020   Procedure: REMOVAL OF BILATERAL TISSUE EXPANDERS WITH PLACEMENT OF BILATERAL BREAST IMPLANTS;  Surgeon: [name], MD;  Location: [place];  Service: Plastics;  Laterality: Bilateral;  1.5 hours   TONSILLECTOMY      There were no vitals filed for  this visit.   Subjective Assessment - 01/02/21 1104     Subjective " we had a longer weekend so working Tuesday was amazing and I made it through the whole day and didin't feel like a crazy amount of pain. Yesterday the pain did start back, i was okay in the morning, and by lunch. I do feel likemy range is better and I can be more active."    Patient Stated Goals to get rid of the pain under her arm and return to exericse    Currently in Pain? Yes    Pain Score 0-No pain    Pain Descriptors / Indicators Pressure                [facility] PT Assessment - 01/02/21 0001       Assessment   Medical Diagnosis right breast cancer    Referring Provider (PT) Dr. Laney Potash Adult PT Treatment/Exercise - 01/02/21 0001       Shoulder Exercises: ROM/Strengthening   UBE (Upper Arm Bike) L1 x 4 min   fwd/bwd x 2 min     Shoulder Exercises: Stretch   Other Shoulder Stretches rhomboid stretch 2 x 20 sec      Manual Therapy   Manual therapy comments skilled palpation and monitoring of pt throughout TPDN    Joint Mobilization T1-T7 PA grade IV,  inferior rib mobs grade III    Soft tissue mobilization IASTM along the R upper trap/ levator scapuale              Trigger Point Dry Needling - 01/02/21 0001     Consent Given? Yes    Education Handout Provided Previously provided    Upper Trapezius Response Twitch reponse elicited;Palpable increased muscle length    Levator Scapulae Response Twitch response elicited;Palpable increased muscle length    Subscapularis Response Twitch response elicited;Palpable increased muscle length    Teres minor Response Twitch response elicited;Palpable increased muscle length                   PT Education - 01/02/21 1158     Education Details Reviwed stretching for the Rhomboids and benefits of self trigger point release and how it can be done at home with tools.    Person(s) Educated Patient     Methods Explanation;Verbal cues    Comprehension Verbalized understanding;Verbal cues required              PT Short Term Goals - 12/03/20 1339       PT SHORT TERM GOAL #1   Title Pt will report that knows how to use her Flexitouch and compression garments to help control the fullness she feels in her right upper quadrant    Time 4    Period Weeks    Status New      PT SHORT TERM GOAL #2   Title Pt will be independent in a basic strengthening program    Time 4    Period Weeks    Status New      PT SHORT TERM GOAL #3   Title Pt will report that the discomfort she feels in her right upper quadrant at the end of her workday is decreased by 25%    Time 4    Period Weeks    Status New               PT Long Term Goals - 12/03/20 1339       PT LONG TERM GOAL #1   Title Pt will report that the pain in her right lateral chest is decreased to a 3/10  at the most    Time 8    Period Weeks    Status New      PT LONG TERM GOAL #2   Title Pt will decrase her Quick DASH score to < 40    Baseline 63.64 on 12/03/2020    Time 8    Period Weeks    Status Revised      PT LONG TERM GOAL #3   Title Pt will increase the grip strength of ther right hand to > 25 pounds    Baseline 7  pounds on 11/28/2020    Time 8    Period Weeks    Status New      PT LONG TERM GOAL #4   Title Pt will be independent in a home exercise program for continued fitness and decreased lymphedema risk    Time 8    Period Weeks    Status New                   Plan - 01/02/21 1156     Clinical Impression Statement Reyes is making great progress with physical therapy noting improvement in shoulder pain/ aggrivation. Continued TPDN focusing on the teres minor/ major and upper trap/ levator  scapuale followed with IASTM techniques and trialed inhibition taping for the upper trap. she did well with beginning hsoulder strengthening and rhomboid stretching noting relief of tension. plan to continue  focusing on posterior shoulder / scapular stability strengthening.    PT Treatment/Interventions ADLs/Self Care Home Management;Electrical Stimulation;Moist Heat;DME Instruction;Therapeutic exercise;Neuromuscular re-education;Orthotic Fit/Training;Patient/family education;Manual techniques;Manual lymph drainage;Compression bandaging;Scar mobilization;Passive range of motion;Dry needling;Taping;Vasopneumatic Device    PT Next Visit Plan soft tissue work , myofascial release and mobilization strethcing to right scapular area, response to DN, GHJ mobs for PA, and posture education, scapulohumeral rhythm.    PT Home Exercise Plan End Rt shoulder A/ROM stretching in doorway throughout her day -  [id number]; "snow angel" for bil abduction in supine and standing with back against wall    Consulted and Agree with Plan of Care Patient             Patient will benefit from skilled therapeutic intervention in order to improve the following deficits and impairments:  Decreased activity tolerance, Decreased endurance, Decreased knowledge of use of DME, Decreased range of motion, Decreased mobility, Decreased strength, Decreased scar mobility, Increased edema, Increased fascial restricitons, Impaired flexibility, Impaired UE functional use, Increased muscle spasms, Postural dysfunction, Pain  Visit Diagnosis: Muscle weakness (generalized)  Chronic right shoulder pain  Stiffness of right shoulder joint     Problem List Patient Active Problem List   Diagnosis Date Noted   Breast cancer ([place]) 04/29/2020   Genetic testing 01/14/2020   Ductal carcinoma in situ (DCIS) of right breast 01/02/2020   Starr Lake PT, DPT, LAT, ATC  01/02/21  12:00 PM      [facility] [facility] [address] [place], [place], [address] Phone: [phone]   Fax:  [phone]  Name: Arleta Apollo MRN: [id number] Date of Birth: [birth year]

## 2021-01-07 ENCOUNTER — Other Ambulatory Visit: Payer: Self-pay

## 2021-01-07 ENCOUNTER — Ambulatory Visit: Payer: Managed Care, Other (non HMO)

## 2021-01-07 DIAGNOSIS — I972 Postmastectomy lymphedema syndrome: Secondary | ICD-10-CM

## 2021-01-07 DIAGNOSIS — Z483 Aftercare following surgery for neoplasm: Secondary | ICD-10-CM

## 2021-01-07 DIAGNOSIS — M6281 Muscle weakness (generalized): Secondary | ICD-10-CM | POA: Diagnosis not present

## 2021-01-07 DIAGNOSIS — M25611 Stiffness of right shoulder, not elsewhere classified: Secondary | ICD-10-CM

## 2021-01-07 DIAGNOSIS — G8929 Other chronic pain: Secondary | ICD-10-CM

## 2021-01-07 NOTE — Therapy (Signed)
[place], [place], [address] Phone: [phone]   Fax:  [phone]  Physical Therapy Treatment  Patient Details  Name: Natalie Cardenas MRN: [id number] Date of Birth: Feb 26, 1976 Referring Provider (PT): Dr. Donne Hazel   Encounter Date: 01/07/2021   PT End of Session - 01/07/21 1234     Visit Number 10    Number of Visits 17    Date for PT Re-Evaluation 01/28/21    PT Start Time 1110    PT Stop Time 1215    PT Time Calculation (min) 65 min    Activity Tolerance Patient tolerated treatment well    Behavior During Therapy [facility] for tasks assessed/performed             Past Medical History:  Diagnosis Date   Anxiety    Breast cancer ([place])    Depression    Diabetes mellitus without complication ([place])    Thyroid nodule     Past Surgical History:  Procedure Laterality Date   BREAST RECONSTRUCTION WITH PLACEMENT OF TISSUE EXPANDER AND FLEX HD (ACELLULAR HYDRATED DERMIS) Bilateral 04/29/2020   Procedure: IMMEDIATE BILATERAL BREAST RECONSTRUCTION WITH PLACEMENT OF TISSUE EXPANDER AND FLEX HD (ACELLULAR HYDRATED DERMIS);  Surgeon: [name], MD;  Location: [place];  Service: Plastics;  Laterality: Bilateral;   NIPPLE SPARING MASTECTOMY WITH SENTINEL LYMPH NODE BIOPSY Bilateral 04/29/2020   Procedure: BILATERAL NIPPLE SPARING MASTECTOMIES WITH RIGHT AXILLARY SENTINEL LYMPH NODE BIOPSY;  Surgeon: Rolm Bookbinder, MD;  Location: [place];  Service: General;  Laterality: Bilateral;  BILATERAL PEC BLOCK, RNFA   REMOVAL OF BILATERAL TISSUE EXPANDERS WITH PLACEMENT OF BILATERAL BREAST IMPLANTS Bilateral 06/23/2020   Procedure: REMOVAL OF BILATERAL TISSUE EXPANDERS WITH PLACEMENT OF BILATERAL BREAST IMPLANTS;  Surgeon: [name], MD;  Location: [place];  Service: Plastics;  Laterality: Bilateral;  1.5 hours   TONSILLECTOMY      There were no vitals  filed for this visit.   Subjective Assessment - 01/07/21 1111     Subjective I wasn't able to leave the tape on longer than Friday so I had to take it off. My pain was okay over the weekend but yesterday was bad. By lunch my pain was bad and by the end of the day I was miserable.    Pertinent History bilateral mastectomy in Apr 29 2020 with removal 2 nodes on the right with immediate exanders. Expanders were removed and implants placed Feb 28. Pt did not have chemo or radiation. She had problems with incision not healing after the implants ( took about 10 weeks). She started developing swelling in her back and under her right arm. She has a pump and has had physical therapy as her feels her range of motion in her shoulder. She has a sleeve and  gauntlet, cirucular knit, She has ordered bras from [facility] and she is uncomfortable in all of them.She has a "cushion" that goes around her back that she got from [facility] Adult PT Treatment/Exercise - 01/07/21 0001       Self-Care   Self-Care Other Self-Care Comments    Other Self-Care Comments  Spent portion of session discussing current functional status with pt as she verbalized feeling frustrated as her pain was back again  by the end of her work day yesterday. Upon further discussion though pt is able to verbalize that her Rt shoulder A/ROM is much improved, her neck pain is gone and she was having these sharp, burning pains at her Rt lateral trunk more consistently thorugout her day and now it's only at the end of her work day and it goes away by the next day. So she felt more encouraged by her progress thus far. Also discussed options for modalities and pt has a TENS unit at home that she plans to bring to next session if she needs further instruction, but discussed that she could wear this at lateral trunk to scapular area during work. Also discussed cryotherapy and pt reports  having an ice roller that she will begin using at work again. Cautioned her to use this for short period of time as her sensation is decreased to prevent freezerburn. Pt verbalized good understanding of all.      Cryotherapy   Number Minutes Cryotherapy 3 Minutes   30 sec on/off   Cryotherapy Location Other (comment)   Rt lateral trunk/lateral scapular border   Type of Cryotherapy Ice massage      Manual Therapy   Manual therapy comments Cut komprex in "C" shape and placed comprilan soft on skin side for pt to try wearing along seam of compressoin bra to see if this will help alleviate some of the irritation she feels here when at work    Soft tissue mobilization In Lt S/L to Rt lateral trunk/scapular border and along Rt pectoralis tendon that softened well by end of session    Scapular Mobilization In Lt S/L into protraction and retraction                       PT Short Term Goals - 12/03/20 1339       PT SHORT TERM GOAL #1   Title Pt will report that knows how to use her Flexitouch and compression garments to help control the fullness she feels in her right upper quadrant    Time 4    Period Weeks    Status New      PT SHORT TERM GOAL #2   Title Pt will be independent in a basic strengthening program    Time 4    Period Weeks    Status New      PT SHORT TERM GOAL #3   Title Pt will report that the discomfort she feels in her right upper quadrant at the end of her workday is decreased by 25%    Time 4    Period Weeks    Status New               PT Long Term Goals - 12/03/20 1339       PT LONG TERM GOAL #1   Title Pt will report that the pain in her right lateral chest is decreased to a 3/10  at the most    Time 8    Period Weeks    Status New      PT LONG TERM GOAL #2   Title Pt will decrase her Quick DASH score to < 40    Baseline 63.64 on 12/03/2020    Time 8    Period Weeks    Status Revised      PT LONG TERM GOAL #3   Title Pt will increase  the grip strength of ther right hand to >  25 pounds    Baseline 7  pounds on 11/28/2020    Time 8    Period Weeks    Status New      PT LONG TERM GOAL #4   Title Pt will be independent in a home exercise program for continued fitness and decreased lymphedema risk    Time 8    Period Weeks    Status New                   Plan - 01/07/21 1234     Clinical Impression Statement Pt became tearful during session voicing her frustrations about her pain returning yesterday by the end of her day. After spending time discussing her current functional status pt did feel better by end of session as she was able to verbalize her progres being more than she realized thus far and is just very, understandably so, frustrated that she is having pain that is still limiting her full return to work. She also c/o feeling increase anxiety over the past month, which she had years ago but has been doing very well until recently. So advised her to call her nurse navigator at the [facility] to discuss current medications and also to discuss her being referred to a counselor or therapist to further discuss the anxiety. Continued briefly with manual therapy at end of session as time allowed and ended with ice which pt reported helped tremendously. She has an ice roller that she reports she will resume using, along with her TENS unit, to work on pain management.    Personal Factors and Comorbidities Comorbidity 3+    Comorbidities right breast cancer, bilateral mastectomy, bilateral  implant with delayed healing of incision.,    Examination-Activity Limitations Caring for Others;Reach Overhead    Examination-Participation Restrictions Occupation    Stability/Clinical Decision Making Stable/Uncomplicated    Rehab Potential Good    PT Frequency 2x / week    PT Duration 8 weeks    PT Treatment/Interventions ADLs/Self Care Home Management;Electrical Stimulation;Moist Heat;DME Instruction;Therapeutic  exercise;Neuromuscular re-education;Orthotic Fit/Training;Patient/family education;Manual techniques;Manual lymph drainage;Compression bandaging;Scar mobilization;Passive range of motion;Dry needling;Taping;Vasopneumatic Device    PT Next Visit Plan Instruct in TENS if she brings hers, has cryotherapy been helpful? soft tissue work , myofascial release and mobilization strethcing to right scapular area, response to DN, GHJ mobs for PA, and posture education, scapulohumeral rhythm.    PT Home Exercise Plan End Rt shoulder A/ROM stretching in doorway throughout her day -  [id number]; "snow angel" for bil abduction in supine and standing with back against wall; try TENS unit and cryotherapy at Rt lateral trunk    Consulted and Agree with Plan of Care Patient             Patient will benefit from skilled therapeutic intervention in order to improve the following deficits and impairments:  Decreased activity tolerance, Decreased endurance, Decreased knowledge of use of DME, Decreased range of motion, Decreased mobility, Decreased strength, Decreased scar mobility, Increased edema, Increased fascial restricitons, Impaired flexibility, Impaired UE functional use, Increased muscle spasms, Postural dysfunction, Pain  Visit Diagnosis: Muscle weakness (generalized)  Chronic right shoulder pain  Stiffness of right shoulder joint  Aftercare following surgery for neoplasm  Postmastectomy lymphedema     Problem List Patient Active Problem List   Diagnosis Date Noted   Breast cancer ([place]) 04/29/2020   Genetic testing 01/14/2020   Ductal carcinoma in situ (DCIS) of right breast 01/02/2020    Otelia Limes, PTA 01/07/2021,  12:47 PM  [place], [place], [address] Phone: [phone]   Fax:  [phone]  Name: Natalie Cardenas MRN: [id number] Date of Birth: 28-Aug-1975

## 2021-01-08 ENCOUNTER — Telehealth: Payer: Self-pay | Admitting: General Practice

## 2021-01-08 NOTE — Telephone Encounter (Signed)
[facility] CSW Progress Notes  Request from Bary Castilla, nurse navigator, to contact patient to offer resources for anxiety management per concerns raised by physical therapist.  Called patient, no answer, left VM w my contact information and encouragement to return call.  Edwyna Shell, LCSW Clinical Social Worker Phone:  [phone]

## 2021-01-09 ENCOUNTER — Encounter: Payer: Self-pay | Admitting: Physical Therapy

## 2021-01-09 ENCOUNTER — Ambulatory Visit: Payer: Managed Care, Other (non HMO) | Admitting: Physical Therapy

## 2021-01-09 ENCOUNTER — Encounter: Payer: Self-pay | Admitting: General Practice

## 2021-01-09 ENCOUNTER — Other Ambulatory Visit: Payer: Self-pay

## 2021-01-09 DIAGNOSIS — G8929 Other chronic pain: Secondary | ICD-10-CM

## 2021-01-09 DIAGNOSIS — M6281 Muscle weakness (generalized): Secondary | ICD-10-CM | POA: Diagnosis not present

## 2021-01-09 DIAGNOSIS — M25511 Pain in right shoulder: Secondary | ICD-10-CM

## 2021-01-09 DIAGNOSIS — M25611 Stiffness of right shoulder, not elsewhere classified: Secondary | ICD-10-CM

## 2021-01-09 DIAGNOSIS — Z483 Aftercare following surgery for neoplasm: Secondary | ICD-10-CM

## 2021-01-09 NOTE — Therapy (Signed)
[place], [place], [address] Phone: [phone]   Fax:  [phone]  Physical Therapy Treatment  Patient Details  Name: Natalie Cardenas MRN: [id number] Date of Birth: [birth year] Referring Provider (PT): Dr. Donne Hazel   Encounter Date: 01/09/2021   PT End of Session - 01/09/21 1107     Visit Number 11    Number of Visits 17    Date for PT Re-Evaluation 01/28/21    PT Start Time 1110   pt arrived late and had to use the restroom.   Activity Tolerance Patient tolerated treatment well    Behavior During Therapy [facility] for tasks assessed/performed             Past Medical History:  Diagnosis Date   Anxiety    Breast cancer ([place])    Depression    Diabetes mellitus without complication ([place])    Thyroid nodule     Past Surgical History:  Procedure Laterality Date   BREAST RECONSTRUCTION WITH PLACEMENT OF TISSUE EXPANDER AND FLEX HD (ACELLULAR HYDRATED DERMIS) Bilateral 04/29/2020   Procedure: IMMEDIATE BILATERAL BREAST RECONSTRUCTION WITH PLACEMENT OF TISSUE EXPANDER AND FLEX HD (ACELLULAR HYDRATED DERMIS);  Surgeon: [name], MD;  Location: [place];  Service: Plastics;  Laterality: Bilateral;   NIPPLE SPARING MASTECTOMY WITH SENTINEL LYMPH NODE BIOPSY Bilateral 04/29/2020   Procedure: BILATERAL NIPPLE SPARING MASTECTOMIES WITH RIGHT AXILLARY SENTINEL LYMPH NODE BIOPSY;  Surgeon: Rolm Bookbinder, MD;  Location: [place];  Service: General;  Laterality: Bilateral;  BILATERAL PEC BLOCK, RNFA   REMOVAL OF BILATERAL TISSUE EXPANDERS WITH PLACEMENT OF BILATERAL BREAST IMPLANTS Bilateral 06/23/2020   Procedure: REMOVAL OF BILATERAL TISSUE EXPANDERS WITH PLACEMENT OF BILATERAL BREAST IMPLANTS;  Surgeon: [name], MD;  Location: [place];  Service: Plastics;  Laterality: Bilateral;  1.5 hours   TONSILLECTOMY      There were no vitals filed for this  visit.   Subjective Assessment - 01/09/21 1111     Subjective " had more time to reflect after my last session and I feel like I am doing better. some days are better, and my ROM is better."    Pertinent History bilateral mastectomy in Apr 29 2020 with removal 2 nodes on the right with immediate exanders. Expanders were removed and implants placed Feb 28. Pt did not have chemo or radiation. She had problems with incision not healing after the implants ( took about 10 weeks). She started developing swelling in her back and under her right arm. She has a pump and has had physical therapy as her feels her range of motion in her shoulder. She has a sleeve and  gauntlet, cirucular knit, She has ordered bras from [facility] and she is uncomfortable in all of them.She has a "cushion" that goes around her back that she got from [facility] [place]    Patient Stated Goals to get rid of the pain under her arm and return to exericse    Currently in Pain? Yes    Pain Location Flank    Pain Type Chronic pain    Pain Onset More than a month ago    Pain Frequency Intermittent    Aggravating Factors  working end of the day                [facility] PT Assessment - 01/09/21 0001       Assessment   Medical Diagnosis right breast cancer  Referring Provider (PT) Dr. Laney Potash Adult PT Treatment/Exercise - 01/09/21 0001       Shoulder Exercises: Seated   Internal Rotation Strengthening;Right;12 reps   with GTB   Other Seated Exercises lower trap with elbows propped on boslter 1 x 15 with RTB, 1 x 15 with GTB    Other Seated Exercises lower trap table dips 2 x 10      Shoulder Exercises: Standing   Protraction Strengthening;Both;12 reps   wall pushup   Flexion Strengthening;Both;12 reps   scaption with 2# bil   Other Standing Exercises protraction pushing into pink foam roller 1 x 10 combined with flexion      Shoulder Exercises: Stretch   Other  Shoulder Stretches sleeper stretch 2 x 30 sec      Manual Therapy   Manual therapy comments Trigger point release for the teres minor                       PT Short Term Goals - 12/03/20 1339       PT SHORT TERM GOAL #1   Title Pt will report that knows how to use her Flexitouch and compression garments to help control the fullness she feels in her right upper quadrant    Time 4    Period Weeks    Status New      PT SHORT TERM GOAL #2   Title Pt will be independent in a basic strengthening program    Time 4    Period Weeks    Status New      PT SHORT TERM GOAL #3   Title Pt will report that the discomfort she feels in her right upper quadrant at the end of her workday is decreased by 25%    Time 4    Period Weeks    Status New               PT Long Term Goals - 12/03/20 1339       PT LONG TERM GOAL #1   Title Pt will report that the pain in her right lateral chest is decreased to a 3/10  at the most    Time 8    Period Weeks    Status New      PT LONG TERM GOAL #2   Title Pt will decrase her Quick DASH score to < 40    Baseline 63.64 on 12/03/2020    Time 8    Period Weeks    Status Revised      PT LONG TERM GOAL #3   Title Pt will increase the grip strength of ther right hand to > 25 pounds    Baseline 7  pounds on 11/28/2020    Time 8    Period Weeks    Status New      PT LONG TERM GOAL #4   Title Pt will be independent in a home exercise program for continued fitness and decreased lymphedema risk    Time 8    Period Weeks    Status New                   Plan - 01/09/21 1208     Clinical Impression Statement pt arrives today in better spirits compared to previous session noting she recognizes the  progress she has made since starting PT. Held off on TPDN today focusing on the MTPR techniques and how ti can be done at home. worked the remainder of the session on gross shoulder strengthening to promote scapulohumeral rhythm. end of  session she noted feeling some soreness in the shoudler located at the spot she has trouble but not to the level it has been before.    Comorbidities right breast cancer, bilateral mastectomy, bilateral  implant with delayed healing of incision.,    PT Treatment/Interventions ADLs/Self Care Home Management;Electrical Stimulation;Moist Heat;DME Instruction;Therapeutic exercise;Neuromuscular re-education;Orthotic Fit/Training;Patient/family education;Manual techniques;Manual lymph drainage;Compression bandaging;Scar mobilization;Passive range of motion;Dry needling;Taping;Vasopneumatic Device    PT Next Visit Plan Instruct in TENS if she brings hers, has cryotherapy been helpful? soft tissue work , myofascial release and mobilization strethcing to right scapular area, response to DN, GHJ mobs for PA, and posture education, scapulohumeral rhythm.    PT Home Exercise Plan End Rt shoulder A/ROM stretching in doorway throughout her day -  [id number]; "snow angel" for bil abduction in supine and standing with back against wall; try TENS unit and cryotherapy at Rt lateral trunk    Consulted and Agree with Plan of Care Patient             Patient will benefit from skilled therapeutic intervention in order to improve the following deficits and impairments:  Decreased activity tolerance, Decreased endurance, Decreased knowledge of use of DME, Decreased range of motion, Decreased mobility, Decreased strength, Decreased scar mobility, Increased edema, Increased fascial restricitons, Impaired flexibility, Impaired UE functional use, Increased muscle spasms, Postural dysfunction, Pain  Visit Diagnosis: Muscle weakness (generalized)  Stiffness of right shoulder joint  Chronic right shoulder pain  Aftercare following surgery for neoplasm     Problem List Patient Active Problem List   Diagnosis Date Noted   Breast cancer ([place]) 04/29/2020   Genetic testing 01/14/2020   Ductal carcinoma in situ (DCIS) of  right breast 01/02/2020   [place] PT, DPT, LAT, ATC  01/09/21  12:11 PM      [facility] [address] [place], [place], [address] Phone: [phone]   Fax:  [phone]  Name: Natalie Cardenas MRN: [id number] Date of Birth: Oct 14, 1975

## 2021-01-09 NOTE — Progress Notes (Signed)
[facility] CSW Progress Notes  Call to patient at request of nurse navigator - RN received communication from PT that patient is struggling w anxiety and needs additional coping skills.  Talked w patient - she is struggling with significant pain post double mastectomy and reconstruction.  Pain has interfered with resumption of normal life including work.  Outside of work, she has responsibilities as a mother to her children.  Recovery from surgery has been complicated.  Had bilateral mastectomy in Jan 2022, reconstruction Feb 2022. Returned to work March 2022.  Had difficulty with wound healing - wound kept opening up which interfered with full workload.  Continues to work 3 days/week at this point, has to make sure she doesn't work 3 days in a row due to intolerable pain.  Is in continued physical therapy in order to deal with continued pain post surgery.  "I have been at the doctor's office every day Donnald Garre been off since surgery."  Feels like her cognitive function is decreased - is double checking herself at work "cant think straight."  "I am miserable", has a hard time working a full day at her current job.  Pain is variable - has good and bad days.  Varies from a "4" to a "15" on a 1 - 10 scale.  Some days pain is livable and she can function, other days it is intolerable.     In addition to pain, she is now experiencing significant anxiety that presents as troubling thoughts, sleep disturbance, difficulty in finding optimism that her situation will ever improve.  Anxiety has become overwhelming over the past 1.5 months. She has been prescribed antidepressants but "these make my heart race and I just cant take them."  Is taking Klonopin which "I only take if I feel like I am going to have a panic attack."  Not on any long term medication management for anxiety.  She has dealt with bouts of anxiety in the past, was able to deal with this by alternative distracting activities (take a bath, read, take a walk). In  past month or two, she has been "more overwhelmed, worried about everything, unable to sleep."  On medications for nerve pain.  Wonders if Lyrica is causing an increase in anxiety.    Has college age children and 60 and 26 year old children.  She wants to be engaged with her children, attend sporting events, connect w older children.   She is consistently coming for physical therapy and other treatments at [facility].  "I am trying to keep on top of all that I need to do."  Has seen some limited improvement since transitioning to [facility].     She is open to referral for help w adjustment to illness and anxiety management - oncologist asked to make referral to [facility] for health psychologist, Dr Michail Sermon.  Edwyna Shell, LCSW Clinical Social Worker Phone:  [phone]

## 2021-01-12 ENCOUNTER — Telehealth: Payer: Self-pay | Admitting: Nurse Practitioner

## 2021-01-12 NOTE — Telephone Encounter (Signed)
Left message with follow-up appointment per 9/17 staff message.

## 2021-01-13 ENCOUNTER — Other Ambulatory Visit: Payer: Self-pay | Admitting: Nurse Practitioner

## 2021-01-13 DIAGNOSIS — F419 Anxiety disorder, unspecified: Secondary | ICD-10-CM

## 2021-01-14 ENCOUNTER — Ambulatory Visit: Payer: Managed Care, Other (non HMO) | Admitting: Physical Therapy

## 2021-01-14 ENCOUNTER — Other Ambulatory Visit: Payer: Self-pay

## 2021-01-14 ENCOUNTER — Encounter: Payer: Self-pay | Admitting: Physical Therapy

## 2021-01-14 DIAGNOSIS — M25511 Pain in right shoulder: Secondary | ICD-10-CM

## 2021-01-14 DIAGNOSIS — M6281 Muscle weakness (generalized): Secondary | ICD-10-CM | POA: Diagnosis not present

## 2021-01-14 DIAGNOSIS — M25611 Stiffness of right shoulder, not elsewhere classified: Secondary | ICD-10-CM

## 2021-01-14 DIAGNOSIS — G8929 Other chronic pain: Secondary | ICD-10-CM

## 2021-01-14 NOTE — Therapy (Signed)
[place], [place], [address] Phone: [phone]   Fax:  [phone]  Physical Therapy Treatment  Patient Details  Name: Natalie Cardenas MRN: [id number] Date of Birth: [birth year] Referring Provider (PT): Dr. Donne Hazel   Encounter Date: 01/14/2021   PT End of Session - 01/14/21 1021     Visit Number 12    Number of Visits 17    Date for PT Re-Evaluation 01/28/21    PT Start Time 1022   pt arrived late and had to use the restroom before tx   PT Stop Time 1100    PT Time Calculation (min) 38 min    Activity Tolerance Patient tolerated treatment well    Behavior During Therapy [facility] for tasks assessed/performed             Past Medical History:  Diagnosis Date   Anxiety    Breast cancer ([facility])    Depression    Diabetes mellitus without complication ([place])    Thyroid nodule     Past Surgical History:  Procedure Laterality Date   BREAST RECONSTRUCTION WITH PLACEMENT OF TISSUE EXPANDER AND FLEX HD (ACELLULAR HYDRATED DERMIS) Bilateral 04/29/2020   Procedure: IMMEDIATE BILATERAL BREAST RECONSTRUCTION WITH PLACEMENT OF TISSUE EXPANDER AND FLEX HD (ACELLULAR HYDRATED DERMIS);  Surgeon: [name], MD;  Location: [facility];  Service: Plastics;  Laterality: Bilateral;   NIPPLE SPARING MASTECTOMY WITH SENTINEL LYMPH NODE BIOPSY Bilateral 04/29/2020   Procedure: BILATERAL NIPPLE SPARING MASTECTOMIES WITH RIGHT AXILLARY SENTINEL LYMPH NODE BIOPSY;  Surgeon: Rolm Bookbinder, MD;  Location: [facility];  Service: General;  Laterality: Bilateral;  BILATERAL PEC BLOCK, RNFA   REMOVAL OF BILATERAL TISSUE EXPANDERS WITH PLACEMENT OF BILATERAL BREAST IMPLANTS Bilateral 06/23/2020   Procedure: REMOVAL OF BILATERAL TISSUE EXPANDERS WITH PLACEMENT OF BILATERAL BREAST IMPLANTS;  Surgeon: [name], MD;  Location: [facility];  Service: Plastics;  Laterality: Bilateral;  1.5  hours   TONSILLECTOMY      There were no vitals filed for this visit.   Subjective Assessment - 01/14/21 1023     Subjective "I feel okay so far today. so far the week hasn't been that bad. i was alittle sore monday, and in the morning Tuesday but stretching helped keep it from getting to elevated."    Patient Stated Goals to get rid of the pain under her arm and return to exericse    Currently in Pain? Yes    Pain Score 0-No pain   described as there but not painful   Pain Orientation Right    Pain Frequency Intermittent                               [facility] Adult PT Treatment/Exercise:  Therapeutic Exercise: Supine 1 x 15 ceiling punches Progressed to rhythmic stabilization 3 x 30  Sustained horizontal abduction with YTB combined with shoulder flexion 2 x 10  Supine Horizontal abduction 2 x 10, diagonals 1 x 10 with YTB Supine Chin tuck head lift 1 x 10 holding 10 sec ea. Supine Scapular protraction pushing into pink foam roll combined with shoulder flexion 2 x 10 with 3# Standing Scaption 2 x 12  Manual Therapy:  MTPR - R upper trap/ levator scapulae  Neuromuscular re-ed: N/A  Therapeutic Activity: N/A  Self Care: N/A  ITEMS NOT PERFORMED TODAY:  PT Short Term Goals - 12/03/20 1339       PT SHORT TERM GOAL #1   Title Pt will report that knows how to use her Flexitouch and compression garments to help control the fullness she feels in her right upper quadrant    Time 4    Period Weeks    Status New      PT SHORT TERM GOAL #2   Title Pt will be independent in a basic strengthening program    Time 4    Period Weeks    Status New      PT SHORT TERM GOAL #3   Title Pt will report that the discomfort she feels in her right upper quadrant at the end of her workday is decreased by 25%    Time 4    Period Weeks    Status New               PT Long Term Goals - 12/03/20 1339       PT LONG TERM GOAL #1   Title Pt  will report that the pain in her right lateral chest is decreased to a 3/10  at the most    Time 8    Period Weeks    Status New      PT LONG TERM GOAL #2   Title Pt will decrase her Quick DASH score to < 40    Baseline 63.64 on 12/03/2020    Time 8    Period Weeks    Status Revised      PT LONG TERM GOAL #3   Title Pt will increase the grip strength of ther right hand to > 25 pounds    Baseline 7  pounds on 11/28/2020    Time 8    Period Weeks    Status New      PT LONG TERM GOAL #4   Title Pt will be independent in a home exercise program for continued fitness and decreased lymphedema risk    Time 8    Period Weeks    Status New                   Plan - 01/14/21 1032     Clinical Impression Statement pt arrives to session noting some soreness in the week but does state it has been manageable and she has been consistent with stretching. due to pt not feeling increased soreness today focused session primarily on scapular stability with emphasis on scapular upward rotaiton to promote scapulohumeral rhythm and increased reps to promote endurance.    PT Treatment/Interventions ADLs/Self Care Home Management;Electrical Stimulation;Moist Heat;DME Instruction;Therapeutic exercise;Neuromuscular re-education;Orthotic Fit/Training;Patient/family education;Manual techniques;Manual lymph drainage;Compression bandaging;Scar mobilization;Passive range of motion;Dry needling;Taping;Vasopneumatic Device    PT Next Visit Plan Instruct in TENS if she brings hers, has cryotherapy been helpful? soft tissue work , myofascial release and mobilization strethcing to right scapular area, response to DN, GHJ mobs for PA, and posture education, scapulohumeral rhythm.    PT Home Exercise Plan End Rt shoulder A/ROM stretching in doorway throughout her day -  [id number]; "snow angel" for bil abduction in supine and standing with back against wall; try TENS unit and cryotherapy at Rt lateral trunk              Patient will benefit from skilled therapeutic intervention in order to improve the following deficits and impairments:  Decreased activity tolerance, Decreased endurance, Decreased knowledge of use of DME, Decreased range of motion, Decreased  mobility, Decreased strength, Decreased scar mobility, Increased edema, Increased fascial restricitons, Impaired flexibility, Impaired UE functional use, Increased muscle spasms, Postural dysfunction, Pain  Visit Diagnosis: Muscle weakness (generalized)  Stiffness of right shoulder joint  Chronic right shoulder pain     Problem List Patient Active Problem List   Diagnosis Date Noted   Breast cancer ([place]) 04/29/2020   Genetic testing 01/14/2020   Ductal carcinoma in situ (DCIS) of right breast 01/02/2020   [place] PT, DPT, LAT, ATC  01/14/21  11:03 AM     [facility] [address] [place], [place], [address] Phone: [phone]   Fax:  [phone]  Name: Natalie Cardenas MRN: [id number] Date of Birth: 18-Aug-1975

## 2021-01-16 ENCOUNTER — Inpatient Hospital Stay: Payer: Managed Care, Other (non HMO) | Attending: Genetic Counselor | Admitting: General Practice

## 2021-01-16 ENCOUNTER — Encounter: Payer: Managed Care, Other (non HMO) | Admitting: Physical Therapy

## 2021-01-16 DIAGNOSIS — D0511 Intraductal carcinoma in situ of right breast: Secondary | ICD-10-CM

## 2021-01-16 NOTE — Progress Notes (Signed)
[facility] CSW Progress Notes  Call to patient to check on progress towards referral for counseling - no answer, left VM w my contact information and encouragement to call back.  Edwyna Shell, LCSW Clinical Social Worker Phone:  [phone]

## 2021-01-27 ENCOUNTER — Encounter: Payer: Managed Care, Other (non HMO) | Admitting: Nurse Practitioner

## 2021-01-27 NOTE — Progress Notes (Signed)
CLINIC:  Survivorship   Patient Care Team: Scherrie Bateman as PCP - General (Family Medicine) Mauro Kaufmann, RN as Oncology Nurse Navigator Rockwell Germany, RN as Oncology Nurse Navigator Truitt Merle, MD as Consulting Physician (Hematology) Rolm Bookbinder, MD as Consulting Physician (General Surgery) Alla Feeling, NP as Nurse Practitioner (Nurse Practitioner)   REASON FOR VISIT:  Routine follow-up post-treatment for a recent history of breast cancer.  BRIEF ONCOLOGIC HISTORY:  Oncology History Overview Note  Cancer Staging Ductal carcinoma in situ (DCIS) of right breast Staging form: Breast, AJCC 8th Edition - Clinical stage from 12/28/2019: Stage 0 (cTis ([place]), cN0, cM0, GX, ER+, PR+, HER2: Not Assessed) - Signed by Truitt Merle, MD on 01/02/2020    Ductal carcinoma in situ (DCIS) of right breast  12/14/2019 Mammogram   IMPRESSION: Suspicious calcifications in the OUTER RETROAREOLAR RIGHT breast spanning a distance of 3.5 cm. Tissue sampling is recommended.   12/28/2019 Cancer Staging   Staging form: Breast, AJCC 8th Edition - Clinical stage from 12/28/2019: Stage 0 (cTis ([place]), cN0, cM0, GX, ER+, PR+, HER2: Not Assessed) - Signed by Truitt Merle, MD on 01/02/2020   12/28/2019 Initial Biopsy   Diagnosis 1. Breast, right, needle core biopsy, lateral, anterior - DUCTAL CARCINOMA IN SITU, HIGH-GRADE WITH FOCAL NECROSIS AND CALCIFICATIONS. SEE NOTE 2. Breast, right, needle core biopsy, 12 o'clock, retroareolar - DUCTAL CARCINOMA IN SITU, HIGH-GRADE WITH FOCAL NECROSIS AND CALCIFICATIONS. SEE NOTE   Diagnosis Note 1. and 2. DCIS measures 0.4 cm in part 1 and 0.2 cm in part 2 in greatest linear dimension. Dr. Tresa Moore reviewed the case and concurs with the diagnosis. A breast prognostic profile (ER, PR) is pending and will be reported in an addendum. [facility] was notified on 01/01/2020.   12/28/2019 Receptors her2   1. PROGNOSTIC  INDICATORS Results: IMMUNOHISTOCHEMICAL AND MORPHOMETRIC ANALYSIS PERFORMED MANUALLY Estrogen Receptor: 95%, POSITIVE, STRONG STAINING INTENSITY Progesterone Receptor: 70%, POSITIVE, STRONG STAINING INTENSITY   01/02/2020 Initial Diagnosis   Ductal carcinoma in situ (DCIS) of right breast   01/11/2020 Breast MRI   IMPRESSION: 1. Biopsy-proven DCIS involving the subareolar location and LOWER OUTER QUADRANT of the RIGHT breast with maximum craniocaudal measurement of 3.8 cm. The non-mass enhancement extends to just behind the nipple. 2. Indeterminate non-mass enhancement involving the LOWER OUTER QUADRANT of the LEFT breast at MIDDLE to POSTERIOR depth spanning 4.0 cm. 3. No pathologic lymphadenopathy.   01/14/2020 Genetic Testing   Negative genetic testing.  KIT c.1879C>T (p.Pro627Ser) and PDGFRA c.1436G>A (p.Arg479Gln) VUS identified on the Multi-cancer panel.  The Multi-Gene Panel offered by [facility] includes sequencing and/or deletion duplication testing of the following 85 genes: AIP, ALK, APC, ATM, AXIN2,BAP1,  BARD1, BLM, BMPR1A, BRCA1, BRCA2, BRIP1, CASR, CDC73, CDH1, CDK4, CDKN1B, CDKN1C, CDKN2A (p14ARF), CDKN2A (p16INK4a), CEBPA, CHEK2, CTNNA1, DICER1, DIS3L2, EGFR (c.2369C>T, p.Thr790Met variant only), EPCAM (Deletion/duplication testing only), FH, FLCN, GATA2, GPC3, GREM1 (Promoter region deletion/duplication testing only), HOXB13 (c.251G>A, p.Gly84Glu), HRAS, KIT, MAX, MEN1, MET, MITF (c.952G>A, p.Glu318Lys variant only), MLH1, MSH2, MSH3, MSH6, MUTYH, NBN, NF1, NF2, NTHL1, PALB2, PDGFRA, PHOX2B, PMS2, POLD1, POLE, POT1, PRKAR1A, PTCH1, PTEN, RAD50, RAD51C, RAD51D, RB1, RECQL4, RET, RNF43, RUNX1, SDHAF2, SDHA (sequence changes only), SDHB, SDHC, SDHD, SMAD4, SMARCA4, SMARCB1, SMARCE1, STK11, SUFU, TERC, TERT, TMEM127, TP53, TSC1, TSC2, VHL, WRN and WT1.  The report date is January 14, 2020.   02/01/2020 Pathology Results   Diagnosis Breast, left, needle core biopsy, lower outer -  FIBROADENOMATOID CHANGES, DUCT  ECTASIA, AND FIBROCYSTIC CHANGES WITH APOCRINE METAPLASIA - NO MALIGNANCY IDENTIFIED Microscopic Comment These results were called to [facility] on February 04, 2020.   02/08/2020 - 02/2020 Anti-estrogen oral therapy   She was interested in chemoprevention with Tamoxifen and took this for 3 weeks before surgery but stopped due to poor toleration. Given b/l mastectomy for DCIS she does not need antiestrogen therapy.    04/29/2020 Surgery   BILATERAL NIPPLE SPARING MASTECTOMIES WITH RIGHT AXILLARY SENTINEL LYMPH NODE BIOPSY bu Dr Donne Hazel    IMMEDIATE BILATERAL BREAST RECONSTRUCTION WITH PLACEMENT OF TISSUE EXPANDER AND FLEX HD (ACELLULAR HYDRATED DERMIS) by Dr Claudia Desanctis    04/29/2020 Pathology Results   FINAL MICROSCOPIC DIAGNOSIS:   A. BREAST, LEFT, MASTECTOMY:  -  Benign breast tissue with duct ectasia, fibroadenomatoid and  fibrocystic changes  -  No malignancy identified   B. BREAST, LEFT NIPPLE, BIOPSY:  -  Benign breast tissue with duct ectasia  -  No malignancy identified   C. BREAST, RIGHT, MASTECTOMY:  -  Ductal carcinoma in situ, high grade, 0.8cm  -  Margins uninvolved by carcinoma (<0.1 cm; anterior margin)  -  Fibrocystic changes and duct ectasia with periductular chronic  inflammation  -  Previous biopsy site changes  -  See oncology table below   D. BREAST, RIGHT NIPPLE, BIOPSY:  -  Benign breast tissue  -  No malignancy identified   E. LYMPH NODE, RIGHT AXILLARY, SENTINEL EXCISION:  -  No carcinoma identified in one lymph node (0/1)   F. SKIN, LEFT MASTECTOMY FLAP, EXCISION:  -  Benign skin  -  No malignancy identified   G. SKIN, RIGHT MASTECTOMY FLAP, EXCISION:  -  Benign skin  -  No malignancy identified    04/29/2020 Cancer Staging   Staging form: Breast, AJCC 8th Edition - Pathologic stage from 04/29/2020: Stage 0 (pTis (DCIS), pN0, cM0, G3, ER+, PR+, HER2: Not Assessed) - Signed by Alla Feeling, NP on  01/28/2021 Histologic grading system: 3 grade system   01/28/2021 Survivorship   SCP delivered by Cira Rue, NP     INTERVAL HISTORY:  Natalie Cardenas presents to the [facility] today for our initial meeting to review her survivorship care plan detailing her treatment course for breast cancer, as well as monitoring long-term side effects of that treatment, education regarding health maintenance, screening, and overall wellness and health promotion.     She is doing okay in general except for discomfort under the right arm.  She describes the sensation is like a "beehive," "stinging," "electric shock," and like "rubbing sandpaper together."  This starts under her right arm wraps around to the subscapular area, shoulder, and up her neck to the right ear.  She tried gabapentin but did not tolerate due to dyspnea.  She takes Lyrica twice a day.  She was on Robaxin 4 times daily and had bad constipation so she stopped it.  This pain causes her to be overwhelmed, anxious, and frustrated that she has not improved by now and cannot function without discomfort.  She works in a [employer], usually Mondays are good days, by lunchtime on Tuesdays she is uncomfortable and miserable, not sure how she will complete the week then gets frustrated.  Right arm feels heavy, and there is a tugging/pulling sensation in the right chest at the implant.  She is doing twice weekly PT/OT with dry needling, lymphedema machine, exercises, and massage. States her pain causes anxiety, not other way around.  She feels this month was better than last and was better than the one prior.  REVIEW OF SYSTEMS:  Review of Systems - Oncology Breast: Denies any new nodularity, masses, tenderness, nipple changes, or nipple discharge.      ONCOLOGY TREATMENT TEAM:  1. Surgeon:  Dr. Donne Hazel at [facility], plastic surgeon Dr. Claudia Desanctis 2. Medical Oncologist: Dr. Burr Medico    PAST MEDICAL/SURGICAL HISTORY:  Past Medical  History:  Diagnosis Date   Anxiety    Breast cancer ([facility])    Depression    Diabetes mellitus without complication ([facility])    Thyroid nodule    Past Surgical History:  Procedure Laterality Date   BREAST RECONSTRUCTION WITH PLACEMENT OF TISSUE EXPANDER AND FLEX HD (ACELLULAR HYDRATED DERMIS) Bilateral 04/29/2020   Procedure: IMMEDIATE BILATERAL BREAST RECONSTRUCTION WITH PLACEMENT OF TISSUE EXPANDER AND FLEX HD (ACELLULAR HYDRATED DERMIS);  Surgeon: [name], MD;  Location: [facility];  Service: Plastics;  Laterality: Bilateral;   NIPPLE SPARING MASTECTOMY WITH SENTINEL LYMPH NODE BIOPSY Bilateral 04/29/2020   Procedure: BILATERAL NIPPLE SPARING MASTECTOMIES WITH RIGHT AXILLARY SENTINEL LYMPH NODE BIOPSY;  Surgeon: Rolm Bookbinder, MD;  Location: [facility];  Service: General;  Laterality: Bilateral;  BILATERAL PEC BLOCK, RNFA   REMOVAL OF BILATERAL TISSUE EXPANDERS WITH PLACEMENT OF BILATERAL BREAST IMPLANTS Bilateral 06/23/2020   Procedure: REMOVAL OF BILATERAL TISSUE EXPANDERS WITH PLACEMENT OF BILATERAL BREAST IMPLANTS;  Surgeon: [name], MD;  Location: [facility];  Service: Plastics;  Laterality: Bilateral;  1.5 hours   TONSILLECTOMY       ALLERGIES:  Allergies  Allergen Reactions   Other Rash    Chlorahexadine wipe/soap     CURRENT MEDICATIONS:  Outpatient Encounter Medications as of 01/28/2021  Medication Sig   acarbose (PRECOSE) 25 MG tablet Take by mouth daily.   acetaminophen (TYLENOL) 500 MG tablet Take 500 mg by mouth every 6 (six) hours as needed.   albuterol (VENTOLIN HFA) 108 (90 Base) MCG/ACT inhaler    ASMANEX HFA 200 MCG/ACT AERO  (Patient not taking: Reported on 11/28/2020)   clonazePAM (KLONOPIN) 0.5 MG tablet Take 1 tablet (0.5 mg total) by mouth 2 (two) times daily as needed.   diphenhydrAMINE (BENADRYL) 25 MG tablet Take 25 mg by mouth at bedtime as needed.   ferrous sulfate 325 (65 FE) MG tablet Take by  mouth.   ibuprofen (ADVIL) 600 MG tablet Take 600 mg by mouth every 6 (six) hours as needed.   methocarbamol (ROBAXIN) 500 MG tablet Take 1 tablet (500 mg total) by mouth every 6 (six) hours as needed for muscle spasms.   ondansetron (ZOFRAN) 4 MG tablet Take 1 tablet (4 mg total) by mouth every 8 (eight) hours as needed for nausea or vomiting. (Patient not taking: No sig reported)   pregabalin (LYRICA) 25 MG capsule Take 25 mg by mouth 2 (two) times daily.   Prenatal Vit-Fe Fumarate-FA (PRENATAL VITAMIN PO) Take by mouth.   Probiotic Product (PROBIOTIC DAILY PO) Take by mouth.   sennosides-docusate sodium (SENOKOT-S) 8.6-50 MG tablet Take 3 tablets by mouth at bedtime.   UNABLE TO FIND daily. Med Name: vitamin b 3   No facility-administered encounter medications on file as of 01/28/2021.     ONCOLOGIC FAMILY HISTORY:  Family History  Problem Relation Age of Onset   Cancer Father 9       esophageal cancer   Cancer Paternal Grandfather        esophageal cancer  Brain cancer Paternal Grandmother      GENETIC COUNSELING/TESTING: Yes 01/07/2020, VUS in KIT and PGDFRA  SOCIAL HISTORY:  Natalie Cardenas is married and lives with her family in [place], [place].  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  She denies any current or history of tobacco, alcohol, or illicit drug use.     PHYSICAL EXAMINATION:  Vital Signs:   Vitals:   01/28/21 1250  BP: 139/86  Pulse: 68  Resp: 18  Temp: 97.9 F (36.6 C)  SpO2: 99%   Filed Weights   01/28/21 1250  Weight: 148 lb 3.2 oz (67.2 kg)   General: Well-nourished, well-appearing female in no acute distress.   HEENT: Sclerae anicteric.  Lymph: No cervical, supraclavicular, or infraclavicular lymphadenopathy noted on palpation.  Respiratory: breathing non-labored.  Neuro:  Steady gait.  Psych: Mood and affect normal and appropriate for situation.  Extremities: No edema. MSK: No focal spinal tenderness to palpation.   Full range of motion in bilateral upper extremities Skin: Warm and dry. Breast exam: S/p bilateral mastectomy and implant reconstruction.  Incisions completely healed.  Hypersensitivity to the right axilla.  No palpable mass in either breast, chest wall, or axilla that I could appreciate  LABORATORY DATA:  None for this visit.  DIAGNOSTIC IMAGING:  None for this visit.      ASSESSMENT AND PLAN:  Ms.. Cardenas is a pleasant 45 y.o. female with Stage 0 right breast DCIS, ER+/PR+, diagnosed in 01/2020, treated with neoadjuvant antiestrogen therapy (tamoxifen) followed by right mastectomy and left prophylactic mastectomy. She presents to the Survivorship Clinic for our initial meeting and routine follow-up post-completion of treatment for breast cancer.    1. Stage 0 right breast DCIS:  Natalie Cardenas is continuing to recover from definitive treatment for breast cancer still with residual neuropathic pain at the right mastectomy site.  She is improving slowly with medication, PT/OT, and multidisciplinary input.  She will follow-up with surgery as scheduled and medical oncology annually.  Due to poor tolerance and bilateral mastectomy, there is no benefit of additional antiestrogen therapy.  There is also no role for mammogram or MRI.  Today, a comprehensive survivorship care plan and treatment summary was reviewed with the patient today detailing her breast cancer diagnosis, treatment course, potential late/long-term effects of treatment, appropriate follow-up care with recommendations for the future, and patient education resources.  A copy of this summary, along with a letter will be sent to the patient's primary care provider via In Basket message after today's visit.    2.  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  She is very sensitive to medication, did not tolerate  gabapentin.  Dr. Donne Hazel prescribed Effexor which she also did not tolerate and stopped it.  She is on Lyrica twice daily and Robaxin as needed.  She is improving slowly, but not completely.  This is affecting her mentally and her ability to work without pain.  She has been referred to Dr. Michail Sermon, I encouraged her to call back to make an appointment for mental health.  She continues Klonopin as needed which is effective.  I will discuss with the multidisciplinary team to see what else we can offer to help manage her neuropathic pain, plan to discuss with IR, breast surgeon and plastics, and neuro onc Dr. Mickeal Skinner.  3. Bone health:  Given Natalie Cardenas  premenopausal status and lack of antiestrogen therapy, she does not need DEXA at this time.  We will start screening when she becomes postmenopausal with the general population. In the meantime, she was encouraged to increase her consumption of foods rich in calcium, as well as increase her weight-bearing activities.  She was given education on specific activities to promote bone health.  4. Cancer screening:  Due to Natalie Cardenas's history and her age, she should receive screening for skin cancers, colon cancer, and gynecologic cancers.  The information and recommendations are listed on the patient's comprehensive care plan/treatment summary and were reviewed in detail with the patient.    5. Health maintenance and wellness promotion: Natalie Cardenas was encouraged to consume 5-7 servings of fruits and vegetables per day. We reviewed the "Nutrition Rainbow" handout. She was also encouraged to engage in moderate to vigorous exercise for 30 minutes per day most days of the week. She was instructed to limit her alcohol consumption and continue to abstain from tobacco use.   6. Support services/counseling: It is not uncommon for this period of the patient's cancer care trajectory to be one of many emotions and stressors.  We discussed an opportunity for her to participate in  the next session of [facility] ("Finding Your New Normal") support group series designed for patients after they have completed treatment.   Natalie Cardenas was encouraged to take advantage of our many other support services programs, support groups, and/or counseling in coping with her new life as a cancer survivor after completing anti-cancer treatment.  She was offered support today through active listening and expressive supportive counseling.  She was given information regarding our available services and encouraged to contact me with any questions or for help enrolling in any of our support group/programs.  She plans to make an appoint with Dr. Michail Sermon for pain-related anxiety.   Dispo:   -Return to cancer center annually -Follow up with surgery as indicated -She is welcome to return back to the Survivorship Clinic at any time; no additional follow-up needed at this time.  -Consider referral back to survivorship as a long-term survivor for continued surveillance  A total of (45) minutes of face-to-face time was spent with this patient with greater than 50% of that time in counseling and care-coordination.   Cira Rue, NP Survivorship Program [facility] [phone]   Note: PRIMARY CARE PROVIDER Jake Samples, [place] [phone]

## 2021-01-28 ENCOUNTER — Ambulatory Visit: Payer: Managed Care, Other (non HMO) | Admitting: Physical Therapy

## 2021-01-28 ENCOUNTER — Other Ambulatory Visit: Payer: Self-pay

## 2021-01-28 ENCOUNTER — Inpatient Hospital Stay: Payer: Managed Care, Other (non HMO) | Attending: Genetic Counselor | Admitting: Nurse Practitioner

## 2021-01-28 ENCOUNTER — Encounter: Payer: Self-pay | Admitting: Physical Therapy

## 2021-01-28 VITALS — BP 139/86 | HR 68 | Temp 97.9°F | Resp 18 | Ht 65.0 in | Wt 148.2 lb

## 2021-01-28 DIAGNOSIS — M6281 Muscle weakness (generalized): Secondary | ICD-10-CM | POA: Insufficient documentation

## 2021-01-28 DIAGNOSIS — I972 Postmastectomy lymphedema syndrome: Secondary | ICD-10-CM | POA: Insufficient documentation

## 2021-01-28 DIAGNOSIS — Z9013 Acquired absence of bilateral breasts and nipples: Secondary | ICD-10-CM | POA: Diagnosis not present

## 2021-01-28 DIAGNOSIS — Z483 Aftercare following surgery for neoplasm: Secondary | ICD-10-CM | POA: Diagnosis not present

## 2021-01-28 DIAGNOSIS — M25511 Pain in right shoulder: Secondary | ICD-10-CM | POA: Insufficient documentation

## 2021-01-28 DIAGNOSIS — D0511 Intraductal carcinoma in situ of right breast: Secondary | ICD-10-CM | POA: Diagnosis not present

## 2021-01-28 DIAGNOSIS — Z86000 Personal history of in-situ neoplasm of breast: Secondary | ICD-10-CM | POA: Insufficient documentation

## 2021-01-28 DIAGNOSIS — M25611 Stiffness of right shoulder, not elsewhere classified: Secondary | ICD-10-CM

## 2021-01-28 DIAGNOSIS — G629 Polyneuropathy, unspecified: Secondary | ICD-10-CM | POA: Insufficient documentation

## 2021-01-28 DIAGNOSIS — M5412 Radiculopathy, cervical region: Secondary | ICD-10-CM | POA: Insufficient documentation

## 2021-01-28 DIAGNOSIS — G8929 Other chronic pain: Secondary | ICD-10-CM | POA: Insufficient documentation

## 2021-01-28 NOTE — Therapy (Signed)
[address] [place], [place], [address] Phone: [phone]   Fax:  [phone]  Physical Therapy Treatment  Patient Details  Name: Natalie Cardenas MRN: [id number] Date of Birth: 1976-04-05 Referring Provider (PT): Dr. Donne Hazel   Encounter Date: 01/28/2021   PT End of Session - 01/28/21 1106     Visit Number 13    Number of Visits 17    Date for PT Re-Evaluation 01/28/21    PT Start Time 1110   pt arrived late then had to use the restroom   PT Stop Time 1148    PT Time Calculation (min) 38 min    Activity Tolerance Patient tolerated treatment well    Behavior During Therapy [facility] for tasks assessed/performed             Past Medical History:  Diagnosis Date   Anxiety    Breast cancer ([place])    Depression    Diabetes mellitus without complication ([place])    Thyroid nodule     Past Surgical History:  Procedure Laterality Date   BREAST RECONSTRUCTION WITH PLACEMENT OF TISSUE EXPANDER AND FLEX HD (ACELLULAR HYDRATED DERMIS) Bilateral 04/29/2020   Procedure: IMMEDIATE BILATERAL BREAST RECONSTRUCTION WITH PLACEMENT OF TISSUE EXPANDER AND FLEX HD (ACELLULAR HYDRATED DERMIS);  Surgeon: [name], MD;  Location: [facility];  Service: Plastics;  Laterality: Bilateral;   NIPPLE SPARING MASTECTOMY WITH SENTINEL LYMPH NODE BIOPSY Bilateral 04/29/2020   Procedure: BILATERAL NIPPLE SPARING MASTECTOMIES WITH RIGHT AXILLARY SENTINEL LYMPH NODE BIOPSY;  Surgeon: Rolm Bookbinder, MD;  Location: [facility];  Service: General;  Laterality: Bilateral;  BILATERAL PEC BLOCK, RNFA   REMOVAL OF BILATERAL TISSUE EXPANDERS WITH PLACEMENT OF BILATERAL BREAST IMPLANTS Bilateral 06/23/2020   Procedure: REMOVAL OF BILATERAL TISSUE EXPANDERS WITH PLACEMENT OF BILATERAL BREAST IMPLANTS;  Surgeon: [name], MD;  Location: [facility];  Service: Plastics;  Laterality: Bilateral;  1.5 hours    TONSILLECTOMY      There were no vitals filed for this visit.   Subjective Assessment - 01/28/21 1109     Subjective "i am okay today. I do have some good and bad days. I was on vacation last week which was great and did good all week until Thursday/ Friday. I feel like I am getting better, I did better than I did a month ago. This week I am still having soreness in the shoulder that felt like the bee hive under my arm."    Pertinent History bilateral mastectomy in Apr 29 2020 with removal 2 nodes on the right with immediate exanders. Expanders were removed and implants placed Feb 28. Pt did not have chemo or radiation. She had problems with incision not healing after the implants ( took about 10 weeks). She started developing swelling in her back and under her right arm. She has a pump and has had physical therapy as her feels her range of motion in her shoulder. She has a sleeve and  gauntlet, cirucular knit, She has ordered bras from [employer] and she is uncomfortable in all of them.She has a "cushion" that goes around her back that she got from [employer] [place]    Patient Stated Goals to get rid of the pain under her arm and return to exericse                         [facility] Adult PT Treatment/Exercise:  Therapeutic  Exercise: N/a  Manual Therapy:  N/A  Neuromuscular re-ed: N/A  Therapeutic Activity: N/A  Modalities: N/A  Self Care: Reviewed with pt her home TENS unit as well as proper use, parameters, pad placement and provided handout.   Consider / progression for next session:                PT Education - 01/28/21 1225     Education Details pt arrived with her home TENS unit. Time was taken to review her TENS unit, how to use it, application of pads, and presets of the unit its self.    Person(s) Educated Patient    Methods Explanation;Verbal cues;Handout    Comprehension Verbalized understanding;Verbal cues required               PT Short Term Goals - 12/03/20 1339       PT SHORT TERM GOAL #1   Title Pt will report that knows how to use her Flexitouch and compression garments to help control the fullness she feels in her right upper quadrant    Time 4    Period Weeks    Status New      PT SHORT TERM GOAL #2   Title Pt will be independent in a basic strengthening program    Time 4    Period Weeks    Status New      PT SHORT TERM GOAL #3   Title Pt will report that the discomfort she feels in her right upper quadrant at the end of her workday is decreased by 25%    Time 4    Period Weeks    Status New               PT Long Term Goals - 12/03/20 1339       PT LONG TERM GOAL #1   Title Pt will report that the pain in her right lateral chest is decreased to a 3/10  at the most    Time 8    Period Weeks    Status New      PT LONG TERM GOAL #2   Title Pt will decrase her Quick DASH score to < 40    Baseline 63.64 on 12/03/2020    Time 8    Period Weeks    Status Revised      PT LONG TERM GOAL #3   Title Pt will increase the grip strength of ther right hand to > 25 pounds    Baseline 7  pounds on 11/28/2020    Time 8    Period Weeks    Status New      PT LONG TERM GOAL #4   Title Pt will be independent in a home exercise program for continued fitness and decreased lymphedema risk    Time 8    Period Weeks    Status New                   Plan - 01/28/21 1106     Clinical Impression Statement pt arrived today with her TENS unit to review proper use, application, and length of use. increased time was used to review TENS and potential placement of pads, as well as addressing pt specific questions. Utilized the patients E-stim along the lateral scapular region due to her area of increased soreness. Following use she did note she was feeling better. pt see's her primary PT's in cancer rehab for a reassessment next session.  PT Treatment/Interventions ADLs/Self Care Home  Management;Electrical Stimulation;Moist Heat;DME Instruction;Therapeutic exercise;Neuromuscular re-education;Orthotic Fit/Training;Patient/family education;Manual techniques;Manual lymph drainage;Compression bandaging;Scar mobilization;Passive range of motion;Dry needling;Taping;Vasopneumatic Device    PT Next Visit Plan ERO - Instruct in TENS if she brings hers, has cryotherapy been helpful? soft tissue work , myofascial release and mobilization strethcing to right scapular area, response to DN, GHJ mobs for PA, and posture education, scapulohumeral rhythm.    PT Home Exercise Plan End Rt shoulder A/ROM stretching in doorway throughout her day -  [id number]; "snow angel" for bil abduction in supine and standing with back against wall; try TENS unit and cryotherapy at Rt lateral trunk    Consulted and Agree with Plan of Care Patient             Patient will benefit from skilled therapeutic intervention in order to improve the following deficits and impairments:  Decreased activity tolerance, Decreased endurance, Decreased knowledge of use of DME, Decreased range of motion, Decreased mobility, Decreased strength, Decreased scar mobility, Increased edema, Increased fascial restricitons, Impaired flexibility, Impaired UE functional use, Increased muscle spasms, Postural dysfunction, Pain  Visit Diagnosis: Muscle weakness (generalized)  Stiffness of right shoulder joint     Problem List Patient Active Problem List   Diagnosis Date Noted   Breast cancer ([place]) 04/29/2020   Genetic testing 01/14/2020   Ductal carcinoma in situ (DCIS) of right breast 01/02/2020    [place]arr [place] PT, DPT, LAT, ATC  01/28/21  12:37 PM      [facility] [address] [place], [place], [address] Phone: [phone]   Fax:  [phone]  Name: Natalie Cardenas MRN: [id number] Date of Birth: Dec 27, 1975

## 2021-01-28 NOTE — Patient Instructions (Signed)
TENS stands for Transcutaneous Electrical Nerve Stimulation. In other words, electrical impulses are allowed to pass through the skin in order to excite a nerve.   Purpose and Use of TENS:  TENS is a method used to manage acute and chronic pain without the use of drugs. It has been effective in managing pain associated with surgery, sprains, strains, trauma, rheumatoid arthritis, and neuralgias. It is a non-addictive, low risk, and non-invasive technique used to control pain. It is not, by any means, a curative form of treatment.   How TENS Works:  Most TENS units are a small pocket-sized unit powered by one 9 volt battery. Attached to the outside of the unit are two lead wires where two pins and/or snaps connect on each wire. All units come with a set of four reusable pads or electrodes. These are placed on the skin surrounding the area involved. By inserting the leads into  the pads, the electricity can pass from the unit making the circuit complete.  As the intensity is turned up slowly, the electrical current enters the body from the electrodes through the skin to the surrounding nerve fibers. This triggers the release of hormones from within the body. These hormones contain pain relievers. By increasing the circulation of these hormones, the person's pain may be lessened. It is also believed that the electrical stimulation itself helps to block the pain messages being sent to the brain, thus also decreasing the body's perception of pain.   Hazards:  TENS units are NOT to be used by patients with PACEMAKERS, DEFIBRILLATORS, DIABETIC PUMPS, PREGNANT WOMEN, and patients with SEIZURE DISORDERS.  TENS units are NOT to be used over the heart, throat, brain, or spinal cord.  One of the major side effects from the TENS unit may be skin irritation. Some people may develop a rash if they are sensitive to the materials used in the electrodes or the connecting wires.     Avoid overuse due the body getting  used to the stem making it not as effective over time.     

## 2021-01-29 ENCOUNTER — Encounter: Payer: Self-pay | Admitting: Nurse Practitioner

## 2021-01-29 MED ORDER — CLONAZEPAM 0.5 MG PO TABS: 0.5000 mg | ORAL_TABLET | Freq: Two times a day (BID) | ORAL | 0 refills | Status: DC | PRN

## 2021-01-30 ENCOUNTER — Other Ambulatory Visit: Payer: Self-pay

## 2021-01-30 ENCOUNTER — Ambulatory Visit: Payer: Managed Care, Other (non HMO)

## 2021-01-30 DIAGNOSIS — G8929 Other chronic pain: Secondary | ICD-10-CM

## 2021-01-30 DIAGNOSIS — M25511 Pain in right shoulder: Secondary | ICD-10-CM

## 2021-01-30 DIAGNOSIS — M25611 Stiffness of right shoulder, not elsewhere classified: Secondary | ICD-10-CM

## 2021-01-30 DIAGNOSIS — G629 Polyneuropathy, unspecified: Secondary | ICD-10-CM | POA: Diagnosis not present

## 2021-01-30 DIAGNOSIS — I972 Postmastectomy lymphedema syndrome: Secondary | ICD-10-CM

## 2021-01-30 DIAGNOSIS — Z483 Aftercare following surgery for neoplasm: Secondary | ICD-10-CM

## 2021-01-30 DIAGNOSIS — M6281 Muscle weakness (generalized): Secondary | ICD-10-CM

## 2021-01-30 NOTE — Therapy (Signed)
[facility] [place], [place], [address] Phone: [phone]   Fax:  [phone]  Physical Therapy Treatment  Patient Details  Name: Natalie Cardenas MRN: [id number] Date of Birth: Aug 28, 1975 Referring Provider (PT): Dr. Donne Hazel   Encounter Date: 01/30/2021   PT End of Session - 01/30/21 1139     Visit Number 14    Number of Visits 26    Date for PT Re-Evaluation 03/13/21    PT Start Time 1010    PT Stop Time 1110    PT Time Calculation (min) 60 min    Activity Tolerance Patient tolerated treatment well    Behavior During Therapy [facility] for tasks assessed/performed             Past Medical History:  Diagnosis Date   Anxiety    Breast cancer ([place])    Depression    Diabetes mellitus without complication ([place])    Thyroid nodule     Past Surgical History:  Procedure Laterality Date   BREAST RECONSTRUCTION WITH PLACEMENT OF TISSUE EXPANDER AND FLEX HD (ACELLULAR HYDRATED DERMIS) Bilateral 04/29/2020   Procedure: IMMEDIATE BILATERAL BREAST RECONSTRUCTION WITH PLACEMENT OF TISSUE EXPANDER AND FLEX HD (ACELLULAR HYDRATED DERMIS);  Surgeon: [name], MD;  Location: [facility];  Service: Plastics;  Laterality: Bilateral;   NIPPLE SPARING MASTECTOMY WITH SENTINEL LYMPH NODE BIOPSY Bilateral 04/29/2020   Procedure: BILATERAL NIPPLE SPARING MASTECTOMIES WITH RIGHT AXILLARY SENTINEL LYMPH NODE BIOPSY;  Surgeon: Rolm Bookbinder, MD;  Location: [facility];  Service: General;  Laterality: Bilateral;  BILATERAL PEC BLOCK, RNFA   REMOVAL OF BILATERAL TISSUE EXPANDERS WITH PLACEMENT OF BILATERAL BREAST IMPLANTS Bilateral 06/23/2020   Procedure: REMOVAL OF BILATERAL TISSUE EXPANDERS WITH PLACEMENT OF BILATERAL BREAST IMPLANTS;  Surgeon: [name], MD;  Location: [facility];  Service: Plastics;  Laterality: Bilateral;  1.5 hours   TONSILLECTOMY      There were no  vitals filed for this visit.   Subjective Assessment - 01/30/21 1012     Subjective I am better than I was, but not where I want to be.  I would like to continue Dry needling with Vania Rea if he is still doing it.  I am frustrated because I am 8 months out and still hurting.  I cant do the work I want to do, or be the mom I want to be.  I have good days and bad days. I used to have a hard day every day, but now i have alot more good days.  I am still uncomfortable but having more good days.  Always have a dull ache that ranges from a 4 to 9/10.  My ROM is much better and it doesn't lock anymore.  It still feels heavy and uncomfortable. I am compliant with home exercises. I can't lift heavier things, or reach as well. Mondays are usually OK, by Tuesday I feel llike I have sandpaper under my arm and it radiates posteriorly around the scapular area and into my right neck.  There is a burning sensation all the time and tingling, the achy muscles are there. The swelling at my back is better and I don't use the flexitouch much anymore.   Pertinent History bilateral mastectomy in Apr 29 2020 with removal 2 nodes on the right with immediate exanders. Expanders were removed and implants placed Feb 28. Pt did not have chemo or radiation. She had problems with incision not  healing after the implants ( took about 10 weeks). She started developing swelling in her back and under her right arm. She has a pump and has had physical therapy as her feels her range of motion in her shoulder. She has a sleeve and  gauntlet, cirucular knit, She has ordered bras from [employer] [place] and she is uncomfortable in all of them.She has a "cushion" that goes around her back that she got from [employer] [place]    Patient Stated Goals to get rid of the pain under her arm and return to exericse    Currently in Pain? Yes    Pain Score 3     Pain Location Scapula    Pain Orientation Right    Pain Descriptors / Indicators  Burning;Numbness;Tightness;Heaviness    Pain Type Chronic pain    Pain Onset More than a month ago    Pain Frequency Constant                [facility] PT Assessment - 01/30/21 0001       Assessment   Medical Diagnosis right breast cancer    Referring Provider (PT) Dr. Donne Hazel      AROM   Right Shoulder Extension 35 Degrees    Right Shoulder Flexion 154 Degrees   burns/pulls under arm   Right Shoulder ABduction 165 Degrees ,burns/pulls   Right Shoulder Internal Rotation --   T9   Right Shoulder External Rotation 95 Degrees      Strength   Right Hand Grip (lbs) 5    Left Hand Grip (lbs) 8               LYMPHEDEMA/ONCOLOGY QUESTIONNAIRE - 01/30/21 0001       Right Upper Extremity Lymphedema   10 cm Proximal to Olecranon Process 28.5 cm    Olecranon Process 24 cm    10 cm Proximal to Ulnar Styloid Process 21.4 cm    Just Proximal to Ulnar Styloid Process 15.6 cm    At Base of 2nd Digit 5.6 cm                Quick Dash - 01/30/21 0001     Open a tight or new jar Unable    Do heavy household chores (wash walls, wash floors) Moderate difficulty    Carry a shopping bag or briefcase Mild difficulty    Wash your back Mild difficulty    Use a knife to cut food Mild difficulty    Recreational activities in which you take some force or impact through your arm, shoulder, or hand (golf, hammering, tennis) Severe difficulty    During the past week, to what extent has your arm, shoulder or hand problem interfered with your normal social activities with family, friends, neighbors, or groups? Slightly    During the past week, to what extent has your arm, shoulder or hand problem limited your work or other regular daily activities Modererately    Arm, shoulder, or hand pain. Moderate    Tingling (pins and needles) in your arm, shoulder, or hand Moderate    Difficulty Sleeping Mild difficulty    DASH Score 45.45 %                    [facility] Adult PT  Treatment/Exercise - 01/30/21 0001       Shoulder Exercises: Pulleys   Flexion 2 minutes    Scaption 1 minute    ABduction 1 minute  PT Education - 01/30/21 1138     Education Details Discussed pt purchasing from [employer] a putty set for hand grip strength secondary to bilateral UE weakness    Person(s) Educated Patient    Methods Explanation    Comprehension Verbalized understanding              PT Short Term Goals - 12/03/20 1339       PT SHORT TERM GOAL #1   Title Pt will report that knows how to use her Flexitouch and compression garments to help control the fullness she feels in her right upper quadrant    Time 4    Period Weeks    Status achieved     PT SHORT TERM GOAL #2   Title Pt will be independent in a basic strengthening program    Time 4    Period Weeks    Status ongoing     PT SHORT TERM GOAL #3   Title Pt will report that the discomfort she feels in her right upper quadrant at the end of her workday is decreased by 25%    Time 4    Period Weeks    Status ongoing               PT Long Term Goals - 01/30/21 1046       PT LONG TERM GOAL #1   Title Pt will report that the pain in her right lateral chest is decreased to a 3/10  at the most    Baseline ranges from 3/10 to 9/10    Time 8    Period Weeks    Status On-going    Target Date 03/13/21      PT LONG TERM GOAL #2   Title Pt will decrease her Quick DASH score to < 40    Baseline 45%    Time 8    Period Weeks    Status On-going    Target Date 03/13/21      PT LONG TERM GOAL #3   Title Pt will increase the grip strength of ther right hand to > 25 pounds    Baseline 5 lbs right, 8 lbs left    Time 8    Period Weeks    Status On-going      PT LONG TERM GOAL #4   Title Pt will be independent in a home exercise program for continued fitness and decreased lymphedema risk    Time 8    Period Weeks    Status Achieved                   Plan  - 01/30/21 1140     Clinical Impression Statement Lengthy discussuion about pt improvements/deficits.  Assessed right shoulder ROM and circumference measures, completed quick dash and checked pt goals.  Pt had some  improvement in Right shoulder ROM and does not experience locking up like she did, but it is still tight and uncomfortable at end ranges and feels heavy. She has improved with her quick dash scores to 45% functional deficits now.  She continues with significant weakness in bilateral Hands and it was suggested she consider purchasing a putty set for progressive strengthening. She is compliant with her HEP and self care techniques.  She is now having some good days, but continues with pain rated 4-9/10 at lateral trunk, scapular area and right neck, especially with work activities.  She has made many modifications to her home and work activities.  While she has made improvements, she continues with frustration because she is not able to fully participate in work or home activitities. She is not sure if improvements are from medication(lyrica), dry needling or exercise/manual therapies.  She is agreeable to continuing therapy and was told we can reasses as needed. She travels an hour to get here.  She would like to do Dry needling with Carlus Pavlov and I did message him to see if he will still be doing those things since he was promoted. We also discussed the possibility she could have therapy at our [facility] which is only 15 minutes from where she lives. She has achieved 1/4 goals and is progressing with remainning.    Personal Factors and Comorbidities Comorbidity 3+    Comorbidities right breast cancer, bilateral mastectomy, bilateral  implant with delayed healing of incision.,    Examination-Activity Limitations Caring for Others;Reach Overhead    Examination-Participation Restrictions Occupation    Stability/Clinical Decision Making Stable/Uncomplicated    Rehab Potential Good    PT  Frequency 2x / week    PT Duration 8 weeks    PT Treatment/Interventions ADLs/Self Care Home Management;Electrical Stimulation;Moist Heat;DME Instruction;Therapeutic exercise;Neuromuscular re-education;Orthotic Fit/Training;Patient/family education;Manual techniques;Manual lymph drainage;Compression bandaging;Scar mobilization;Passive range of motion;Dry needling;Taping;Vasopneumatic Device    PT Next Visit Plan  has cryotherapy been helpful? soft tissue work , myofascial release and mobilization strethcing to right scapular area, response to DN, GHJ mobs for PA, and posture education, scapulohumeral rhythm.    PT Home Exercise Plan End Rt shoulder A/ROM stretching in doorway throughout her day -  [id number]; "snow angel" for bil abduction in supine and standing with back against wall; try TENS unit and cryotherapy at Rt lateral trunk    Consulted and Agree with Plan of Care Patient             Patient will benefit from skilled therapeutic intervention in order to improve the following deficits and impairments:  Decreased activity tolerance, Decreased endurance, Decreased knowledge of use of DME, Decreased range of motion, Decreased mobility, Decreased strength, Decreased scar mobility, Increased edema, Increased fascial restricitons, Impaired flexibility, Impaired UE functional use, Increased muscle spasms, Postural dysfunction, Pain  Visit Diagnosis: Muscle weakness (generalized)  Stiffness of right shoulder joint  Chronic right shoulder pain  Aftercare following surgery for neoplasm  Postmastectomy lymphedema     Problem List Patient Active Problem List   Diagnosis Date Noted   Breast cancer ([place]) 04/29/2020   Genetic testing 01/14/2020   Ductal carcinoma in situ (DCIS) of right breast 01/02/2020    Claris Pong, PT 01/30/2021, 11:53 AM  [facility] @ [place], [place], [address] Phone: [phone]    Fax:  [phone]  Name: Natalie Cardenas MRN: [id number] Date of Birth: Nov 10, 1975

## 2021-02-04 ENCOUNTER — Telehealth: Payer: Self-pay | Admitting: Internal Medicine

## 2021-02-04 ENCOUNTER — Other Ambulatory Visit: Payer: Self-pay | Admitting: Nurse Practitioner

## 2021-02-04 ENCOUNTER — Other Ambulatory Visit: Payer: Self-pay

## 2021-02-04 ENCOUNTER — Ambulatory Visit: Payer: Managed Care, Other (non HMO)

## 2021-02-04 DIAGNOSIS — G629 Polyneuropathy, unspecified: Secondary | ICD-10-CM | POA: Diagnosis not present

## 2021-02-04 DIAGNOSIS — M6281 Muscle weakness (generalized): Secondary | ICD-10-CM

## 2021-02-04 DIAGNOSIS — M25611 Stiffness of right shoulder, not elsewhere classified: Secondary | ICD-10-CM

## 2021-02-04 DIAGNOSIS — Z483 Aftercare following surgery for neoplasm: Secondary | ICD-10-CM

## 2021-02-04 DIAGNOSIS — G8929 Other chronic pain: Secondary | ICD-10-CM

## 2021-02-04 DIAGNOSIS — I972 Postmastectomy lymphedema syndrome: Secondary | ICD-10-CM

## 2021-02-04 DIAGNOSIS — M792 Neuralgia and neuritis, unspecified: Secondary | ICD-10-CM

## 2021-02-04 MED ORDER — PREGABALIN 25 MG PO CAPS: 25.0000 mg | ORAL_CAPSULE | Freq: Two times a day (BID) | ORAL | 1 refills | Status: DC

## 2021-02-04 NOTE — Therapy (Signed)
[facility] @ [place], [place], [address] Phone: [phone]   Fax:  [phone]  Physical Therapy Treatment  Patient Details  Name: Natalie Cardenas MRN: [id number] Date of Birth: 1976/02/11 Referring Provider (PT): Dr. Donne Hazel   Encounter Date: 02/04/2021   PT End of Session - 02/04/21 1227     Visit Number 15    Number of Visits 26    Date for PT Re-Evaluation 03/13/21    PT Start Time 1111    PT Stop Time 1213    PT Time Calculation (min) 62 min    Activity Tolerance Patient tolerated treatment well    Behavior During Therapy [facility] for tasks assessed/performed             Past Medical History:  Diagnosis Date   Anxiety    Breast cancer ([place])    Depression    Diabetes mellitus without complication ([place])    Thyroid nodule     Past Surgical History:  Procedure Laterality Date   BREAST RECONSTRUCTION WITH PLACEMENT OF TISSUE EXPANDER AND FLEX HD (ACELLULAR HYDRATED DERMIS) Bilateral 04/29/2020   Procedure: IMMEDIATE BILATERAL BREAST RECONSTRUCTION WITH PLACEMENT OF TISSUE EXPANDER AND FLEX HD (ACELLULAR HYDRATED DERMIS);  Surgeon: [name], MD;  Location: [facility];  Service: Plastics;  Laterality: Bilateral;   NIPPLE SPARING MASTECTOMY WITH SENTINEL LYMPH NODE BIOPSY Bilateral 04/29/2020   Procedure: BILATERAL NIPPLE SPARING MASTECTOMIES WITH RIGHT AXILLARY SENTINEL LYMPH NODE BIOPSY;  Surgeon: Rolm Bookbinder, MD;  Location: [facility];  Service: General;  Laterality: Bilateral;  BILATERAL PEC BLOCK, RNFA   REMOVAL OF BILATERAL TISSUE EXPANDERS WITH PLACEMENT OF BILATERAL BREAST IMPLANTS Bilateral 06/23/2020   Procedure: REMOVAL OF BILATERAL TISSUE EXPANDERS WITH PLACEMENT OF BILATERAL BREAST IMPLANTS;  Surgeon: [name], MD;  Location: [facility];  Service: Plastics;  Laterality: Bilateral;  1.5 hours   TONSILLECTOMY      There were no  vitals filed for this visit.   Subjective Assessment - 02/04/21 1142     Subjective I want to start working 3 days a week one time to try so the first week of November I am only going to do PT 1x and work M-W with PT the following Thursday.    Pertinent History bilateral mastectomy in Apr 29 2020 with removal 2 nodes on the right with immediate exanders. Expanders were removed and implants placed Feb 28. Pt did not have chemo or radiation. She had problems with incision not healing after the implants ( took about 10 weeks). She started developing swelling in her back and under her right arm. She has a pump and has had physical therapy as her feels her range of motion in her shoulder. She has a sleeve and  gauntlet, cirucular knit, She has ordered bras from [employer] [place] and she is uncomfortable in all of them.She has a "cushion" that goes around her back that she got from [employer] [place]    Patient Stated Goals to get rid of the pain under her arm and return to exericse    Currently in Pain? Yes    Pain Score 3     Pain Location Scapula    Pain Orientation Right    Pain Descriptors / Indicators Aching;Dull    Pain Type Chronic pain    Pain Radiating Towards upper trap to ear    Pain Onset More than a month ago    Pain  Frequency Constant    Aggravating Factors  by end of work day    Pain Relieving Factors stretching, TENS unit has helped some                               [facility] Adult PT Treatment/Exercise - 02/04/21 0001       Self-Care   Other Self-Care Comments  Spent time at beginning of session discussing pts current functional status this week. She really wants to be able to decr freq but is scared if she does her pain will get worse. So educated her that she could always come back with a new referral if D/C and that wouldn't be a problem. She also wants to start working 3 days in a row so is going to try this first week of Nov and have PT only 1x that week. Pt  brought her TENS unit in so assisted pt with electrode placement at end of session and encouraged her to try other settings as she had yet to do this      Manual Therapy   Soft tissue mobilization In Lt S/L to Rt lateral trunk/scapular border with tightness palpable    Scapular Mobilization In Lt S/L into protraction and retraction                       PT Short Term Goals - 12/03/20 1339       PT SHORT TERM GOAL #1   Title Pt will report that knows how to use her Flexitouch and compression garments to help control the fullness she feels in her right upper quadrant    Time 4    Period Weeks    Status New      PT SHORT TERM GOAL #2   Title Pt will be independent in a basic strengthening program    Time 4    Period Weeks    Status New      PT SHORT TERM GOAL #3   Title Pt will report that the discomfort she feels in her right upper quadrant at the end of her workday is decreased by 25%    Time 4    Period Weeks    Status New               PT Long Term Goals - 01/30/21 1046       PT LONG TERM GOAL #1   Title Pt will report that the pain in her right lateral chest is decreased to a 3/10  at the most    Baseline ranges from 4 to 9/10    Time 8    Period Weeks    Status On-going    Target Date 03/13/21      PT LONG TERM GOAL #2   Title Pt will decrease her Quick DASH score to < 40    Baseline 45%    Time 8    Period Weeks    Status On-going    Target Date 03/13/21      PT LONG TERM GOAL #3   Title Pt will increase the grip strength of ther right hand to > 25 pounds    Baseline 5 lbs right, 8 lbs left    Time 8    Period Weeks    Status On-going      PT LONG TERM GOAL #4   Title Pt will be independent in a home  exercise program for continued fitness and decreased lymphedema risk    Time 8    Period Weeks    Status Achieved                   Plan - 02/04/21 1232     Clinical Impression Statement Answered pts questions about if she  D/C she can always return to physical therapy if the need arises. This encouraged her to think about decreasing freq in 2 weeks as she plans to increase her work days to 3 consecutive days and she would like to work towards D/C if that goes well without having increased pain that limits ADLs and work duties. Continued manual therapy working to decrease palpable trigger point and tightness at Rt lateral trunk inferior to axilla.    Personal Factors and Comorbidities Comorbidity 3+    Comorbidities right breast cancer, bilateral mastectomy, bilateral  implant with delayed healing of incision.,    Examination-Activity Limitations Caring for Others;Reach Overhead    Examination-Participation Restrictions Occupation    Stability/Clinical Decision Making Stable/Uncomplicated    Rehab Potential Good    PT Frequency 2x / week    PT Duration 8 weeks    PT Treatment/Interventions ADLs/Self Care Home Management;Electrical Stimulation;Moist Heat;DME Instruction;Therapeutic exercise;Neuromuscular re-education;Orthotic Fit/Training;Patient/family education;Manual techniques;Manual lymph drainage;Compression bandaging;Scar mobilization;Passive range of motion;Dry needling;Taping;Vasopneumatic Device    PT Next Visit Plan Has cryotherapy been helpful? soft tissue work , myofascial release and mobilization stretching to right scapular area, response to DN, GHJ mobs for PA, and posture education, scapulohumeral rhythm.    PT Home Exercise Plan End Rt shoulder A/ROM stretching in doorway throughout her day -  [id number]; "snow angel" for bil abduction in supine and standing with back against wall; try TENS unit and cryotherapy at Rt lateral trunk    Consulted and Agree with Plan of Care Patient             Patient will benefit from skilled therapeutic intervention in order to improve the following deficits and impairments:  Decreased activity tolerance, Decreased endurance, Decreased knowledge of use of DME,  Decreased range of motion, Decreased mobility, Decreased strength, Decreased scar mobility, Increased edema, Increased fascial restricitons, Impaired flexibility, Impaired UE functional use, Increased muscle spasms, Postural dysfunction, Pain  Visit Diagnosis: Muscle weakness (generalized)  Stiffness of right shoulder joint  Chronic right shoulder pain  Aftercare following surgery for neoplasm  Postmastectomy lymphedema     Problem List Patient Active Problem List   Diagnosis Date Noted   Breast cancer ([place]) 04/29/2020   Genetic testing 01/14/2020   Ductal carcinoma in situ (DCIS) of right breast 01/02/2020    Otelia Limes, PTA 02/04/2021, 12:48 PM  [facility] @ [place], [place], [address] Phone: [phone]   Fax:  [phone]  Name: Zakiah Gauthreaux MRN: [id number] Date of Birth: 29-Sep-1975

## 2021-02-04 NOTE — Telephone Encounter (Signed)
Scheduled appt per 10/12 referral. Pt is aware of appt date and time.  

## 2021-02-11 ENCOUNTER — Encounter: Payer: Self-pay | Admitting: Physical Therapy

## 2021-02-11 ENCOUNTER — Ambulatory Visit: Payer: Managed Care, Other (non HMO) | Admitting: Physical Therapy

## 2021-02-11 ENCOUNTER — Other Ambulatory Visit: Payer: Self-pay

## 2021-02-11 DIAGNOSIS — M25611 Stiffness of right shoulder, not elsewhere classified: Secondary | ICD-10-CM

## 2021-02-11 DIAGNOSIS — G8929 Other chronic pain: Secondary | ICD-10-CM

## 2021-02-11 DIAGNOSIS — Z483 Aftercare following surgery for neoplasm: Secondary | ICD-10-CM

## 2021-02-11 DIAGNOSIS — M25511 Pain in right shoulder: Secondary | ICD-10-CM

## 2021-02-11 DIAGNOSIS — G629 Polyneuropathy, unspecified: Secondary | ICD-10-CM | POA: Diagnosis not present

## 2021-02-11 DIAGNOSIS — M6281 Muscle weakness (generalized): Secondary | ICD-10-CM

## 2021-02-11 NOTE — Therapy (Signed)
[facility] @ [place], [place], [address] Phone: [phone]   Fax:  [phone]  Physical Therapy Treatment  Patient Details  Name: Natalie Cardenas MRN: [id number] Date of Birth: [birth year] Referring Provider (PT): Dr. Donne Hazel   Encounter Date: 02/11/2021   PT End of Session - 02/11/21 1356     Visit Number 16    Number of Visits 26    Date for PT Re-Evaluation 03/13/21    PT Start Time 1308   pt arrived late   PT Stop Time 1355    PT Time Calculation (min) 47 min    Activity Tolerance Patient tolerated treatment well    Behavior During Therapy [facility] for tasks assessed/performed             Past Medical History:  Diagnosis Date   Anxiety    Breast cancer ([place])    Depression    Diabetes mellitus without complication ([place])    Thyroid nodule     Past Surgical History:  Procedure Laterality Date   BREAST RECONSTRUCTION WITH PLACEMENT OF TISSUE EXPANDER AND FLEX HD (ACELLULAR HYDRATED DERMIS) Bilateral 04/29/2020   Procedure: IMMEDIATE BILATERAL BREAST RECONSTRUCTION WITH PLACEMENT OF TISSUE EXPANDER AND FLEX HD (ACELLULAR HYDRATED DERMIS);  Surgeon: [name], MD;  Location: [facility];  Service: Plastics;  Laterality: Bilateral;   NIPPLE SPARING MASTECTOMY WITH SENTINEL LYMPH NODE BIOPSY Bilateral 04/29/2020   Procedure: BILATERAL NIPPLE SPARING MASTECTOMIES WITH RIGHT AXILLARY SENTINEL LYMPH NODE BIOPSY;  Surgeon: Rolm Bookbinder, MD;  Location: [facility];  Service: General;  Laterality: Bilateral;  BILATERAL PEC BLOCK, RNFA   REMOVAL OF BILATERAL TISSUE EXPANDERS WITH PLACEMENT OF BILATERAL BREAST IMPLANTS Bilateral 06/23/2020   Procedure: REMOVAL OF BILATERAL TISSUE EXPANDERS WITH PLACEMENT OF BILATERAL BREAST IMPLANTS;  Surgeon: [name], MD;  Location: [facility];  Service: Plastics;  Laterality: Bilateral;  1.5 hours   TONSILLECTOMY       There were no vitals filed for this visit.   Subjective Assessment - 02/11/21 1310     Subjective My ROM has gotten better but my pain has it's good days and bad days. Yesterday was a bad day.    Pertinent History bilateral mastectomy in Apr 29 2020 with removal 2 nodes on the right with immediate exanders. Expanders were removed and implants placed Feb 28. Pt did not have chemo or radiation. She had problems with incision not healing after the implants ( took about 10 weeks). She started developing swelling in her back and under her right arm. She has a pump and has had physical therapy as her feels her range of motion in her shoulder. She has a sleeve and  gauntlet, cirucular knit, She has ordered bras from [employer] [place] and she is uncomfortable in all of them.She has a "cushion" that goes around her back that she got from [employer] [place]    Patient Stated Goals to get rid of the pain under her arm and return to exericse    Currently in Pain? Yes    Pain Score 3     Pain Location Scapula    Pain Orientation Right;Lateral    Pain Descriptors / Indicators Aching;Dull    Pain Type Chronic pain    Pain Radiating Towards upper trap to ear    Pain Onset More than a month ago    Pain Frequency Constant    Aggravating Factors  by end of  work day    Pain Relieving Factors stretching, TENS    Effect of Pain on Daily Activities limits work and ADLs                               [facility] Adult PT Treatment/Exercise - 02/11/21 0001       Manual Therapy   Soft tissue mobilization In Lt S/L to Rt lateral trunk/scapular border with tightness palpable and trigger point noted at lateral axilla                       PT Short Term Goals - 12/03/20 1339       PT SHORT TERM GOAL #1   Title Pt will report that knows how to use her Flexitouch and compression garments to help control the fullness she feels in her right upper quadrant    Time 4    Period Weeks     Status New      PT SHORT TERM GOAL #2   Title Pt will be independent in a basic strengthening program    Time 4    Period Weeks    Status New      PT SHORT TERM GOAL #3   Title Pt will report that the discomfort she feels in her right upper quadrant at the end of her workday is decreased by 25%    Time 4    Period Weeks    Status New               PT Long Term Goals - 01/30/21 1046       PT LONG TERM GOAL #1   Title Pt will report that the pain in her right lateral chest is decreased to a 3/10  at the most    Baseline ranges from 4 to 9/10    Time 8    Period Weeks    Status On-going    Target Date 03/13/21      PT LONG TERM GOAL #2   Title Pt will decrease her Quick DASH score to < 40    Baseline 45%    Time 8    Period Weeks    Status On-going    Target Date 03/13/21      PT LONG TERM GOAL #3   Title Pt will increase the grip strength of ther right hand to > 25 pounds    Baseline 5 lbs right, 8 lbs left    Time 8    Period Weeks    Status On-going      PT LONG TERM GOAL #4   Title Pt will be independent in a home exercise program for continued fitness and decreased lymphedema risk    Time 8    Period Weeks    Status Achieved                   Plan - 02/11/21 1357     Clinical Impression Statement Discussed with pt about possible causes of tightness and need to see an ortho dr if she continues to have decreased internal rotation and increased pain that does not improve with stetching. Pt reports her ROM of her shoulder is improving but she is still having pain in her lateral trunk and right lateral scapula that takes her breath away when she works. The pain she describes sounds like nerve pain. She has a trigger point in area  of teres minor that when pressed elicits the same pain she feels at work. She would benefit from continued manual therapy to help decrease pain and improve ROM.    PT Frequency 2x / week    PT Duration 8 weeks    PT  Treatment/Interventions ADLs/Self Care Home Management;Electrical Stimulation;Moist Heat;DME Instruction;Therapeutic exercise;Neuromuscular re-education;Orthotic Fit/Training;Patient/family education;Manual techniques;Manual lymph drainage;Compression bandaging;Scar mobilization;Passive range of motion;Dry needling;Taping;Vasopneumatic Device    PT Next Visit Plan Has cryotherapy been helpful? soft tissue work , myofascial release and mobilization stretching to right scapular area, response to DN, GHJ mobs for PA, and posture education, scapulohumeral rhythm.    PT Home Exercise Plan End Rt shoulder A/ROM stretching in doorway throughout her day -  [id number]; "snow angel" for bil abduction in supine and standing with back against wall; try TENS unit and cryotherapy at Rt lateral trunk    Consulted and Agree with Plan of Care Patient             Patient will benefit from skilled therapeutic intervention in order to improve the following deficits and impairments:  Decreased activity tolerance, Decreased endurance, Decreased knowledge of use of DME, Decreased range of motion, Decreased mobility, Decreased strength, Decreased scar mobility, Increased edema, Increased fascial restricitons, Impaired flexibility, Impaired UE functional use, Increased muscle spasms, Postural dysfunction, Pain  Visit Diagnosis: Muscle weakness (generalized)  Stiffness of right shoulder joint  Chronic right shoulder pain  Aftercare following surgery for neoplasm     Problem List Patient Active Problem List   Diagnosis Date Noted   Breast cancer ([place]) 04/29/2020   Genetic testing 01/14/2020   Ductal carcinoma in situ (DCIS) of right breast 01/02/2020    Manus Gunning, PT 02/11/2021, 2:00 PM  [facility] @ [place], [place], [address] Phone: [phone]   Fax:  [phone]  Name: Natalie Cardenas MRN: [id number] Date of Birth:  [birth year]  Manus Gunning, PT 02/11/21 2:01 PM

## 2021-02-16 ENCOUNTER — Other Ambulatory Visit: Payer: Self-pay

## 2021-02-16 ENCOUNTER — Inpatient Hospital Stay: Payer: Managed Care, Other (non HMO) | Admitting: Internal Medicine

## 2021-02-16 VITALS — BP 138/84 | HR 85 | Temp 98.2°F | Resp 17 | Wt 148.2 lb

## 2021-02-16 DIAGNOSIS — M792 Neuralgia and neuritis, unspecified: Secondary | ICD-10-CM

## 2021-02-16 DIAGNOSIS — M5412 Radiculopathy, cervical region: Secondary | ICD-10-CM | POA: Diagnosis not present

## 2021-02-16 DIAGNOSIS — C50919 Malignant neoplasm of unspecified site of unspecified female breast: Secondary | ICD-10-CM | POA: Diagnosis not present

## 2021-02-16 DIAGNOSIS — G629 Polyneuropathy, unspecified: Secondary | ICD-10-CM | POA: Diagnosis not present

## 2021-02-16 NOTE — Progress Notes (Signed)
[facility] [place], [place] [address] [phone]   New Patient Evaluation  Date of Service: 02/16/21 Patient Name: Natalie Cardenas Patient MRN: [id number] Patient DOB: 1975-05-15 Provider: Ventura Sellers, MD  Identifying Statement:  Natalie Cardenas is a 45 y.o. female with neuropathic symptoms who presents for initial consultation and evaluation regarding cancer associated neurologic deficits.    Referring Provider: Jake Samples, PA-C [address] [place],  [place] [address]  Primary Cancer:  Oncologic History: Oncology History Overview Note  Cancer Staging Ductal carcinoma in situ (DCIS) of right breast Staging form: Breast, AJCC 8th Edition - Clinical stage from 12/28/2019: Stage 0 (cTis ([place]), cN0, cM0, GX, ER+, PR+, HER2: Not Assessed) - Signed by Truitt Merle, MD on 01/02/2020    Ductal carcinoma in situ (DCIS) of right breast  12/14/2019 Mammogram   IMPRESSION: Suspicious calcifications in the OUTER RETROAREOLAR RIGHT breast spanning a distance of 3.5 cm. Tissue sampling is recommended.   12/28/2019 Cancer Staging   Staging form: Breast, AJCC 8th Edition - Clinical stage from 12/28/2019: Stage 0 (cTis ([place]), cN0, cM0, GX, ER+, PR+, HER2: Not Assessed) - Signed by Truitt Merle, MD on 01/02/2020    12/28/2019 Initial Biopsy   Diagnosis 1. Breast, right, needle core biopsy, lateral, anterior - DUCTAL CARCINOMA IN SITU, HIGH-GRADE WITH FOCAL NECROSIS AND CALCIFICATIONS. SEE NOTE 2. Breast, right, needle core biopsy, 12 o'clock, retroareolar - DUCTAL CARCINOMA IN SITU, HIGH-GRADE WITH FOCAL NECROSIS AND CALCIFICATIONS. SEE NOTE   Diagnosis Note 1. and 2. DCIS measures 0.4 cm in part 1 and 0.2 cm in part 2 in greatest linear dimension. Dr. Tresa Moore reviewed the case and concurs with the diagnosis. A breast prognostic profile (ER, PR) is pending and will be reported in an addendum. [facility] was  notified on 01/01/2020.   12/28/2019 Receptors her2   1. PROGNOSTIC INDICATORS Results: IMMUNOHISTOCHEMICAL AND MORPHOMETRIC ANALYSIS PERFORMED MANUALLY Estrogen Receptor: 95%, POSITIVE, STRONG STAINING INTENSITY Progesterone Receptor: 70%, POSITIVE, STRONG STAINING INTENSITY   01/02/2020 Initial Diagnosis   Ductal carcinoma in situ (DCIS) of right breast   01/11/2020 Breast MRI   IMPRESSION: 1. Biopsy-proven DCIS involving the subareolar location and LOWER OUTER QUADRANT of the RIGHT breast with maximum craniocaudal measurement of 3.8 cm. The non-mass enhancement extends to just behind the nipple. 2. Indeterminate non-mass enhancement involving the LOWER OUTER QUADRANT of the LEFT breast at MIDDLE to POSTERIOR depth spanning 4.0 cm. 3. No pathologic lymphadenopathy.   01/14/2020 Genetic Testing   Negative genetic testing.  KIT c.1879C>T (p.Pro627Ser) and PDGFRA c.1436G>A (p.Arg479Gln) VUS identified on the Multi-cancer panel.  The Multi-Gene Panel offered by [facility] includes sequencing and/or deletion duplication testing of the following 85 genes: AIP, ALK, APC, ATM, AXIN2,BAP1,  BARD1, BLM, BMPR1A, BRCA1, BRCA2, BRIP1, CASR, CDC73, CDH1, CDK4, CDKN1B, CDKN1C, CDKN2A (p14ARF), CDKN2A (p16INK4a), CEBPA, CHEK2, CTNNA1, DICER1, DIS3L2, EGFR (c.2369C>T, p.Thr790Met variant only), EPCAM (Deletion/duplication testing only), FH, FLCN, GATA2, GPC3, GREM1 (Promoter region deletion/duplication testing only), HOXB13 (c.251G>A, p.Gly84Glu), HRAS, KIT, MAX, MEN1, MET, MITF (c.952G>A, p.Glu318Lys variant only), MLH1, MSH2, MSH3, MSH6, MUTYH, NBN, NF1, NF2, NTHL1, PALB2, PDGFRA, PHOX2B, PMS2, POLD1, POLE, POT1, PRKAR1A, PTCH1, PTEN, RAD50, RAD51C, RAD51D, RB1, RECQL4, RET, RNF43, RUNX1, SDHAF2, SDHA (sequence changes only), SDHB, SDHC, SDHD, SMAD4, SMARCA4, SMARCB1, SMARCE1, STK11, SUFU, TERC, TERT, TMEM127, TP53, TSC1, TSC2, VHL, WRN and WT1.  The report date is January 14, 2020.   02/01/2020 Pathology Results  Diagnosis Breast, left, needle core biopsy, lower outer - FIBROADENOMATOID CHANGES, DUCT ECTASIA, AND FIBROCYSTIC CHANGES WITH APOCRINE METAPLASIA - NO MALIGNANCY IDENTIFIED Microscopic Comment These results were called to [facility] on February 04, 2020.   02/08/2020 - 02/2020 Anti-estrogen oral therapy   She was interested in chemoprevention with Tamoxifen and took this for 3 weeks before surgery but stopped due to poor toleration. Given b/l mastectomy for DCIS she does not need antiestrogen therapy.    04/29/2020 Surgery   BILATERAL NIPPLE SPARING MASTECTOMIES WITH RIGHT AXILLARY SENTINEL LYMPH NODE BIOPSY bu Dr Donne Hazel    IMMEDIATE BILATERAL BREAST RECONSTRUCTION WITH PLACEMENT OF TISSUE EXPANDER AND FLEX HD (ACELLULAR HYDRATED DERMIS) by Dr Claudia Desanctis    04/29/2020 Pathology Results   FINAL MICROSCOPIC DIAGNOSIS:   A. BREAST, LEFT, MASTECTOMY:  -  Benign breast tissue with duct ectasia, fibroadenomatoid and  fibrocystic changes  -  No malignancy identified   B. BREAST, LEFT NIPPLE, BIOPSY:  -  Benign breast tissue with duct ectasia  -  No malignancy identified   C. BREAST, RIGHT, MASTECTOMY:  -  Ductal carcinoma in situ, high grade, 0.8cm  -  Margins uninvolved by carcinoma (<0.1 cm; anterior margin)  -  Fibrocystic changes and duct ectasia with periductular chronic  inflammation  -  Previous biopsy site changes  -  See oncology table below   D. BREAST, RIGHT NIPPLE, BIOPSY:  -  Benign breast tissue  -  No malignancy identified   E. LYMPH NODE, RIGHT AXILLARY, SENTINEL EXCISION:  -  No carcinoma identified in one lymph node (0/1)   F. SKIN, LEFT MASTECTOMY FLAP, EXCISION:  -  Benign skin  -  No malignancy identified   G. SKIN, RIGHT MASTECTOMY FLAP, EXCISION:  -  Benign skin  -  No malignancy identified    04/29/2020 Cancer Staging   Staging form: Breast, AJCC 8th Edition - Pathologic stage from 04/29/2020: Stage 0 (pTis (DCIS), pN0, cM0, G3, ER+,  PR+, HER2: Not Assessed) - Signed by Alla Feeling, NP on 01/28/2021 Histologic grading system: 3 grade system    01/28/2021 Survivorship   SCP delivered by Cira Rue, NP     History of Present Illness: The patient's records from the referring physician were obtained and reviewed and the patient interviewed to confirm this HPI.  Chery Giusto presents today with neuropathic symptoms.  She describes pain, sensory changes described as "like constant sandpaper" under her right arm and shoulder.  This has been present in recent months, but she can't pinpoint an exact start date with all of her other concurrent medical issues.  Severity is moderate to severe, not improving over time.  She describes episodes of pain which run down her right arm to her fingers, and also can spread up to the back of her neck and even right side of her face.  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  She does have some grip weakness but only noticeable when opening tight jar lids.  No issues with her gait, balance.  Continues on hormone therapy, never underwent breast or axillary radiation therapy.  She has been dosing lyrica 62m three times per day after poor tolerance of oral gabapentin.  Medications: Current Outpatient Medications on File Prior to Visit  Medication Sig Dispense Refill   acetaminophen (TYLENOL) 500 MG tablet Take 500 mg by mouth every 6 (six) hours as needed.     ferrous sulfate 325 (65 FE)  MG tablet Take by mouth.     ibuprofen (ADVIL) 600 MG tablet Take 600 mg by mouth every 6 (six) hours as needed.     pregabalin (LYRICA) 25 MG capsule Take 1 capsule (25 mg total) by mouth 2 (two) times daily. 60 capsule 1   Prenatal Vit-Fe Fumarate-FA (PRENATAL VITAMIN PO) Take by mouth.     Probiotic Product (PROBIOTIC DAILY PO) Take by mouth.     UNABLE TO FIND daily. Med Name: vitamin b 3     acarbose (PRECOSE) 25 MG tablet Take by mouth daily. (Patient not taking:  Reported on 02/16/2021)     albuterol (VENTOLIN HFA) 108 (90 Base) MCG/ACT inhaler  (Patient not taking: Reported on 02/16/2021)     ASMANEX HFA 200 MCG/ACT AERO  (Patient not taking: No sig reported)     clonazePAM (KLONOPIN) 0.5 MG tablet Take 1 tablet (0.5 mg total) by mouth 2 (two) times daily as needed. (Patient not taking: Reported on 02/16/2021) 30 tablet 0   diphenhydrAMINE (BENADRYL) 25 MG tablet Take 25 mg by mouth at bedtime as needed. (Patient not taking: Reported on 02/16/2021)     methocarbamol (ROBAXIN) 500 MG tablet Take 1 tablet (500 mg total) by mouth every 6 (six) hours as needed for muscle spasms. (Patient not taking: Reported on 02/16/2021) 20 tablet 0   ondansetron (ZOFRAN) 4 MG tablet Take 1 tablet (4 mg total) by mouth every 8 (eight) hours as needed for nausea or vomiting. (Patient not taking: No sig reported) 20 tablet 0   sennosides-docusate sodium (SENOKOT-S) 8.6-50 MG tablet Take 3 tablets by mouth at bedtime. (Patient not taking: Reported on 02/16/2021)     No current facility-administered medications on file prior to visit.    Allergies:  Allergies  Allergen Reactions   Other Rash    Chlorahexadine wipe/soap   Past Medical History:  Past Medical History:  Diagnosis Date   Anxiety    Breast cancer ([place])    Depression    Diabetes mellitus without complication ([place])    Thyroid nodule    Past Surgical History:  Past Surgical History:  Procedure Laterality Date   BREAST RECONSTRUCTION WITH PLACEMENT OF TISSUE EXPANDER AND FLEX HD (ACELLULAR HYDRATED DERMIS) Bilateral 04/29/2020   Procedure: IMMEDIATE BILATERAL BREAST RECONSTRUCTION WITH PLACEMENT OF TISSUE EXPANDER AND FLEX HD (ACELLULAR HYDRATED DERMIS);  Surgeon: [name], MD;  Location: [facility];  Service: Plastics;  Laterality: Bilateral;   NIPPLE SPARING MASTECTOMY WITH SENTINEL LYMPH NODE BIOPSY Bilateral 04/29/2020   Procedure: BILATERAL NIPPLE SPARING MASTECTOMIES WITH RIGHT  AXILLARY SENTINEL LYMPH NODE BIOPSY;  Surgeon: Rolm Bookbinder, MD;  Location: [facility];  Service: General;  Laterality: Bilateral;  BILATERAL PEC BLOCK, RNFA   REMOVAL OF BILATERAL TISSUE EXPANDERS WITH PLACEMENT OF BILATERAL BREAST IMPLANTS Bilateral 06/23/2020   Procedure: REMOVAL OF BILATERAL TISSUE EXPANDERS WITH PLACEMENT OF BILATERAL BREAST IMPLANTS;  Surgeon: [name], MD;  Location: [facility];  Service: Plastics;  Laterality: Bilateral;  1.5 hours   TONSILLECTOMY     Social History:  Social History   Socioeconomic History   Marital status: Married    Spouse name: Not on file   Number of children: 4   Years of education: Not on file   Highest education level: Not on file  Occupational History   Not on file  Tobacco Use   Smoking status: Never   Smokeless tobacco: Never  Vaping Use   Vaping Use: Never used  Substance and Sexual Activity   Alcohol use: Yes    Comment: social    Drug use: Never   Sexual activity: Yes  Other Topics Concern   Not on file  Social History Narrative   Not on file   Social Determinants of Health   Financial Resource Strain: Not on file  Food Insecurity: Not on file  Transportation Needs: Not on file  Physical Activity: Not on file  Stress: Not on file  Social Connections: Not on file  Intimate Partner Violence: Not on file   Family History:  Family History  Problem Relation Age of Onset   Cancer Father 60       esophageal cancer   Cancer Paternal Grandfather        esophageal cancer    Brain cancer Paternal Grandmother     Review of Systems: Constitutional: Doesn't report fevers, chills or abnormal weight loss Eyes: Doesn't report blurriness of vision Ears, nose, mouth, throat, and face: Doesn't report sore throat Respiratory: Doesn't report cough, dyspnea or wheezes Cardiovascular: Doesn't report palpitation, chest discomfort  Gastrointestinal:  Doesn't report nausea, constipation,  diarrhea GU: Doesn't report incontinence Skin: Doesn't report skin rashes Neurological: Per HPI Musculoskeletal: Doesn't report joint pain Behavioral/Psych: Doesn't report anxiety  Physical Exam: Vitals:   02/16/21 1025  BP: 138/84  Pulse: 85  Resp: 17  Temp: 98.2 F (36.8 C)  SpO2: 100%   KPS: 80. General: Alert, cooperative, pleasant, in no acute distress Head: Normal EENT: No conjunctival injection or scleral icterus.  Lungs: Resp effort normal Cardiac: Regular rate Abdomen: Non-distended abdomen Skin: No rashes cyanosis or petechiae. Extremities: No clubbing or edema  Neurologic Exam: Mental Status: Awake, alert, attentive to examiner. Oriented to self and environment. Language is fluent with intact comprehension.  Cranial Nerves: Visual acuity is grossly normal. Visual fields are full. Extra-ocular movements intact. No ptosis. Face is symmetric Motor: Tone and bulk are normal. Power is full in both arms and legs. Reflexes are symmetric, no pathologic reflexes present.  Sensory: Intact to light touch Gait: Normal.   Labs: I have reviewed the data as listed    Component Value Date/Time   NA 138 06/18/2020 1455   K 4.0 06/18/2020 1455   CL 104 06/18/2020 1455   CO2 25 06/18/2020 1455   GLUCOSE 120 (H) 06/18/2020 1455   BUN 11 06/18/2020 1455   CREATININE 0.59 06/18/2020 1455   CREATININE 0.73 01/07/2020 1619   CALCIUM 9.4 06/18/2020 1455   PROT 6.6 01/07/2020 1619   ALBUMIN 3.7 01/07/2020 1619   AST 16 01/07/2020 1619   ALT 15 01/07/2020 1619   ALKPHOS 86 01/07/2020 1619   BILITOT 0.5 01/07/2020 1619   GFRNONAA >60 06/18/2020 1455   GFRNONAA >60 01/07/2020 1619   GFRAA >60 01/07/2020 1619   Lab Results  Component Value Date   WBC 7.1 01/07/2020   NEUTROABS 4.4 01/07/2020   HGB 11.1 (L) 01/07/2020   HCT 34.3 (L) 01/07/2020   MCV 96.9 01/07/2020   PLT 222 01/07/2020     Assessment/Plan Cervical radiculopathy - Plan: MR CERVICAL SPINE W WO  CONTRAST  Malignant neoplasm of female breast, unspecified estrogen receptor status, unspecified laterality, unspecified site of breast ([place]) - Plan: MR CERVICAL SPINE W WO CONTRAST  Neuropathic pain of upper extremity - Plan: MR CERVICAL SPINE W WO CONTRAST  Faatima Tench presents with clinical syndrome which we suspect localizes to right cervical spine nerve roots, likely lower c-spine or high t-spine levels.  This  is based on exam findings (preserved reflexes reflecting intact C5-6) and elements of history (neck and facial involvement, radiation).  Etiology is unclear, may be multifactorial, with pre-existing mostly subclinical occupational injury and tissue disruption from surgeries.  Alternately, she could have a brachial plexopathy or distal mononeuropathy, although these don't fit as well with history and exam.  To begin workup, we recommended evaluation with cervical spine MRI, ideally contrast enhanced given underlying cancer syndrome.  For pain/symptoms, we recommended increasing Lyrica to 14m BID x2 weeks, then increasing to 727mBID if tolerated and improved.  We spent twenty additional minutes teaching regarding the natural history, biology, and historical experience in the treatment of neurologic complications of cancer.   We appreciate the opportunity to participate in the care of TeKendy Haston We will follow up with her after cervical spine study and lyrica trial.  Other workup considerations could include EMG/NCS or brachial plexus imaging study.  All questions were answered. The patient knows to call the clinic with any problems, questions or concerns. No barriers to learning were detected.  The total time spent in the encounter was 40 minutes and more than 50% was on counseling and review of test results   ZaVentura SellersMD Medical Director of Neuro-Oncology [facility]0/24/22 1:52 PM

## 2021-02-17 ENCOUNTER — Telehealth: Payer: Self-pay | Admitting: Internal Medicine

## 2021-02-17 NOTE — Telephone Encounter (Signed)
Scheduled per 10/24 los, message was left with pt

## 2021-02-18 ENCOUNTER — Other Ambulatory Visit: Payer: Self-pay

## 2021-02-18 ENCOUNTER — Ambulatory Visit: Payer: Managed Care, Other (non HMO) | Admitting: Physical Therapy

## 2021-02-18 ENCOUNTER — Encounter: Payer: Self-pay | Admitting: Physical Therapy

## 2021-02-18 DIAGNOSIS — G8929 Other chronic pain: Secondary | ICD-10-CM

## 2021-02-18 DIAGNOSIS — G629 Polyneuropathy, unspecified: Secondary | ICD-10-CM | POA: Diagnosis not present

## 2021-02-18 DIAGNOSIS — M6281 Muscle weakness (generalized): Secondary | ICD-10-CM

## 2021-02-18 DIAGNOSIS — M25611 Stiffness of right shoulder, not elsewhere classified: Secondary | ICD-10-CM

## 2021-02-18 NOTE — Therapy (Signed)
[facility] [place], [place], [address] Phone: [phone]   Fax:  [phone]  Physical Therapy Treatment  Patient Details  Name: Natalie Cardenas MRN: [id number] Date of Birth: 16-Aug-1975 Referring Provider (PT): Dr. Donne Hazel   Encounter Date: 02/18/2021   PT End of Session - 02/18/21 1024     Visit Number 17    Number of Visits 26    Date for PT Re-Evaluation 03/13/21    PT Start Time 1024   pt arrived earlier but used the restroom before starting   PT Stop Time 1100    PT Time Calculation (min) 36 min    Activity Tolerance Patient tolerated treatment well    Behavior During Therapy [facility] for tasks assessed/performed             Past Medical History:  Diagnosis Date   Anxiety    Breast cancer ([place])    Depression    Diabetes mellitus without complication ([place])    Thyroid nodule     Past Surgical History:  Procedure Laterality Date   BREAST RECONSTRUCTION WITH PLACEMENT OF TISSUE EXPANDER AND FLEX HD (ACELLULAR HYDRATED DERMIS) Bilateral 04/29/2020   Procedure: IMMEDIATE BILATERAL BREAST RECONSTRUCTION WITH PLACEMENT OF TISSUE EXPANDER AND FLEX HD (ACELLULAR HYDRATED DERMIS);  Surgeon: [name], MD;  Location: [facility];  Service: Plastics;  Laterality: Bilateral;   NIPPLE SPARING MASTECTOMY WITH SENTINEL LYMPH NODE BIOPSY Bilateral 04/29/2020   Procedure: BILATERAL NIPPLE SPARING MASTECTOMIES WITH RIGHT AXILLARY SENTINEL LYMPH NODE BIOPSY;  Surgeon: Rolm Bookbinder, MD;  Location: [facility];  Service: General;  Laterality: Bilateral;  BILATERAL PEC BLOCK, RNFA   REMOVAL OF BILATERAL TISSUE EXPANDERS WITH PLACEMENT OF BILATERAL BREAST IMPLANTS Bilateral 06/23/2020   Procedure: REMOVAL OF BILATERAL TISSUE EXPANDERS WITH PLACEMENT OF BILATERAL BREAST IMPLANTS;  Surgeon: [name], MD;  Location: [facility];  Service: Plastics;  Laterality: Bilateral;  1.5  hours   TONSILLECTOMY      There were no vitals filed for this visit.   Subjective Assessment - 02/18/21 1025     Subjective "The shoulder is about the same with good and bad days. one thing I am having today that started yesterday which is totally different which is pulling sensation in my right pec which flet like when I had my expander."    Pertinent History bilateral mastectomy in Apr 29 2020 with removal 2 nodes on the right with immediate exanders. Expanders were removed and implants placed Feb 28. Pt did not have chemo or radiation. She had problems with incision not healing after the implants ( took about 10 weeks). She started developing swelling in her back and under her right arm. She has a pump and has had physical therapy as her feels her range of motion in her shoulder. She has a sleeve and  gauntlet, cirucular knit, She has ordered bras from [facility] and she is uncomfortable in all of them.She has a "cushion" that goes around her back that she got from [facility] [place]    Currently in Pain? Yes                [facility] PT Assessment - 02/18/21 0001       Assessment   Medical Diagnosis right breast cancer    Referring Provider (PT) Dr. Donne Hazel                      [facility] Adult  PT Treatment/Exercise:  Therapeutic Exercise: Foam roll routine 1 x 15 ea Ceiling punche Horizontal abd/ add Alternating ceiling punches X to Y Back stroke  Manual Therapy: Skilled palpation and monitoring of pt during TPDN IASTM along the pec major  MTPR along the pec major  Neuromuscular re-ed: N/A  Therapeutic Activity: N/A  Modalities: N/A  Self Care: N/A  Consider / progression for next session:        Trigger Point Dry Needling - 02/18/21 0001     Consent Given? Yes    Subscapularis Response Twitch response elicited;Palpable increased muscle length    Teres minor Response Twitch response elicited;Palpable increased muscle length                    PT Education - 02/18/21 1057     Education Details Reviewed and updated HEP today    Person(s) Educated Patient    Methods Verbal cues    Comprehension Verbalized understanding;Verbal cues required              PT Short Term Goals - 12/03/20 1339       PT SHORT TERM GOAL #1   Title Pt will report that knows how to use her Flexitouch and compression garments to help control the fullness she feels in her right upper quadrant    Time 4    Period Weeks    Status New      PT SHORT TERM GOAL #2   Title Pt will be independent in a basic strengthening program    Time 4    Period Weeks    Status New      PT SHORT TERM GOAL #3   Title Pt will report that the discomfort she feels in her right upper quadrant at the end of her workday is decreased by 25%    Time 4    Period Weeks    Status New               PT Long Term Goals - 01/30/21 1046       PT LONG TERM GOAL #1   Title Pt will report that the pain in her right lateral chest is decreased to a 3/10  at the most    Baseline ranges from 4 to 9/10    Time 8    Period Weeks    Status On-going    Target Date 03/13/21      PT LONG TERM GOAL #2   Title Pt will decrease her Quick DASH score to < 40    Baseline 45%    Time 8    Period Weeks    Status On-going    Target Date 03/13/21      PT LONG TERM GOAL #3   Title Pt will increase the grip strength of ther right hand to > 25 pounds    Baseline 5 lbs right, 8 lbs left    Time 8    Period Weeks    Status On-going      PT LONG TERM GOAL #4   Title Pt will be independent in a home exercise program for continued fitness and decreased lymphedema risk    Time 8    Period Weeks    Status Achieved                   Plan - 02/18/21 1100     Clinical Impression Statement shortened session due to pt arvving lating and having to use  the restroom.She reports some soreness in the r pec which starte dyestreday wich she noted could be related to  tossing a pillow. further assessment revealed potential pec major aggrivation which calmed with MTPR and stretching. continued TPDN focusing on teres minor followed with IASTM techniques. focused session primarily on scapulothoracc mobility which she did well with, and updated HEP to include at home.    PT Treatment/Interventions ADLs/Self Care Home Management;Electrical Stimulation;Moist Heat;DME Instruction;Therapeutic exercise;Neuromuscular re-education;Orthotic Fit/Training;Patient/family education;Manual techniques;Manual lymph drainage;Compression bandaging;Scar mobilization;Passive range of motion;Dry needling;Taping;Vasopneumatic Device    PT Next Visit Plan Has cryotherapy been helpful? soft tissue work , myofascial release and mobilization stretching to right scapular area, response to DN, GHJ mobs for PA, and posture education, scapulohumeral rhythm.    PT Home Exercise Plan End Rt shoulder A/ROM stretching in doorway throughout her day -  [id number]; "snow angel" for bil abduction in supine and standing with back against wall; try TENS unit and cryotherapy at Rt lateral trunk    Consulted and Agree with Plan of Care Patient             Patient will benefit from skilled therapeutic intervention in order to improve the following deficits and impairments:  Decreased activity tolerance, Decreased endurance, Decreased knowledge of use of DME, Decreased range of motion, Decreased mobility, Decreased strength, Decreased scar mobility, Increased edema, Increased fascial restricitons, Impaired flexibility, Impaired UE functional use, Increased muscle spasms, Postural dysfunction, Pain  Visit Diagnosis: Muscle weakness (generalized)  Stiffness of right shoulder joint  Chronic right shoulder pain     Problem List Patient Active Problem List   Diagnosis Date Noted   Breast cancer ([place]) 04/29/2020   Genetic testing 01/14/2020   Ductal carcinoma in situ (DCIS) of right breast 01/02/2020    Starr [place] PT, DPT, LAT, ATC  02/18/21  11:08 AM     [facility] [address] [place], [place], [address] Phone: [phone]   Fax:  [phone]  Name: Natalie Cardenas MRN: [id number] Date of Birth: Jan 02, 1976

## 2021-02-19 ENCOUNTER — Other Ambulatory Visit: Payer: Self-pay | Admitting: Radiation Therapy

## 2021-02-20 ENCOUNTER — Encounter: Payer: Self-pay | Admitting: Internal Medicine

## 2021-02-20 ENCOUNTER — Encounter: Payer: Self-pay | Admitting: Rehabilitation

## 2021-02-24 ENCOUNTER — Other Ambulatory Visit: Payer: Self-pay | Admitting: Radiation Therapy

## 2021-02-26 ENCOUNTER — Ambulatory Visit (HOSPITAL_COMMUNITY): Payer: Managed Care, Other (non HMO)

## 2021-02-26 ENCOUNTER — Ambulatory Visit (INDEPENDENT_AMBULATORY_CARE_PROVIDER_SITE_OTHER): Payer: 59 | Admitting: Psychologist

## 2021-02-26 ENCOUNTER — Ambulatory Visit: Payer: Managed Care, Other (non HMO) | Admitting: Internal Medicine

## 2021-02-26 ENCOUNTER — Encounter: Payer: Managed Care, Other (non HMO) | Admitting: Physical Therapy

## 2021-02-26 ENCOUNTER — Ambulatory Visit: Payer: Managed Care, Other (non HMO) | Admitting: Physical Therapy

## 2021-02-26 DIAGNOSIS — F411 Generalized anxiety disorder: Secondary | ICD-10-CM | POA: Diagnosis not present

## 2021-03-03 ENCOUNTER — Encounter: Payer: Self-pay | Admitting: Internal Medicine

## 2021-03-03 ENCOUNTER — Other Ambulatory Visit: Payer: Self-pay | Admitting: Internal Medicine

## 2021-03-03 ENCOUNTER — Other Ambulatory Visit: Payer: Self-pay | Admitting: *Deleted

## 2021-03-03 ENCOUNTER — Ambulatory Visit: Payer: Managed Care, Other (non HMO) | Admitting: Internal Medicine

## 2021-03-03 ENCOUNTER — Ambulatory Visit
Admission: RE | Admit: 2021-03-03 | Discharge: 2021-03-03 | Disposition: A | Discharge status: Home / Self Care | Payer: Self-pay | Source: Ambulatory Visit | Attending: Internal Medicine | Admitting: Internal Medicine

## 2021-03-03 ENCOUNTER — Telehealth: Payer: Self-pay | Admitting: *Deleted

## 2021-03-03 DIAGNOSIS — M5412 Radiculopathy, cervical region: Secondary | ICD-10-CM

## 2021-03-03 MED ORDER — PREGABALIN 50 MG PO CAPS: 50.0000 mg | ORAL_CAPSULE | Freq: Two times a day (BID) | ORAL | 3 refills | Status: DC

## 2021-03-03 NOTE — Telephone Encounter (Signed)
Patient called about her Lyrica prescription.  She states that at her last visit she was told she could increase her Lyrica up to 50 mg BID and feels like she is tolerating it well.  She is asking for a refill.  Routed to MD to reorder.

## 2021-03-05 ENCOUNTER — Encounter: Payer: Self-pay | Admitting: *Deleted

## 2021-03-05 ENCOUNTER — Encounter: Payer: Managed Care, Other (non HMO) | Admitting: Physical Therapy

## 2021-03-11 ENCOUNTER — Encounter: Payer: Managed Care, Other (non HMO) | Admitting: Physical Therapy

## 2021-03-12 ENCOUNTER — Ambulatory Visit: Payer: Managed Care, Other (non HMO) | Attending: General Surgery | Admitting: Physical Therapy

## 2021-03-12 ENCOUNTER — Inpatient Hospital Stay: Payer: Managed Care, Other (non HMO) | Admitting: Internal Medicine

## 2021-03-12 ENCOUNTER — Ambulatory Visit: Payer: Managed Care, Other (non HMO) | Admitting: Internal Medicine

## 2021-03-12 ENCOUNTER — Other Ambulatory Visit: Payer: Self-pay

## 2021-03-12 DIAGNOSIS — M25611 Stiffness of right shoulder, not elsewhere classified: Secondary | ICD-10-CM | POA: Insufficient documentation

## 2021-03-12 DIAGNOSIS — E119 Type 2 diabetes mellitus without complications: Secondary | ICD-10-CM | POA: Insufficient documentation

## 2021-03-12 DIAGNOSIS — Z808 Family history of malignant neoplasm of other organs or systems: Secondary | ICD-10-CM | POA: Insufficient documentation

## 2021-03-12 DIAGNOSIS — Z86000 Personal history of in-situ neoplasm of breast: Secondary | ICD-10-CM | POA: Insufficient documentation

## 2021-03-12 DIAGNOSIS — I972 Postmastectomy lymphedema syndrome: Secondary | ICD-10-CM | POA: Insufficient documentation

## 2021-03-12 DIAGNOSIS — G8929 Other chronic pain: Secondary | ICD-10-CM | POA: Insufficient documentation

## 2021-03-12 DIAGNOSIS — M6281 Muscle weakness (generalized): Secondary | ICD-10-CM | POA: Diagnosis not present

## 2021-03-12 DIAGNOSIS — M5412 Radiculopathy, cervical region: Secondary | ICD-10-CM | POA: Insufficient documentation

## 2021-03-12 DIAGNOSIS — M25511 Pain in right shoulder: Secondary | ICD-10-CM | POA: Insufficient documentation

## 2021-03-12 DIAGNOSIS — Z483 Aftercare following surgery for neoplasm: Secondary | ICD-10-CM | POA: Insufficient documentation

## 2021-03-12 MED ORDER — PREGABALIN 75 MG PO CAPS
75.0000 mg | ORAL_CAPSULE | Freq: Two times a day (BID) | ORAL | 3 refills | Status: DC
Start: 2021-03-12 — End: 2021-04-01

## 2021-03-12 NOTE — Therapy (Signed)
[name], [place], [address] Phone: [phone]   Fax:  [phone]  Physical Therapy Treatment  Patient Details  Name: Natalie Cardenas MRN: [id number] Date of Birth: [birth year] Referring Provider (PT): Dr. Donne Hazel   Encounter Date: 03/12/2021   PT End of Session - 03/12/21 1108     Visit Number 18    Number of Visits 26    Date for PT Re-Evaluation 03/13/21    PT Start Time 1110   pt arrived late then had to use the restroom.   PT Stop Time 1150    PT Time Calculation (min) 40 min    Activity Tolerance Patient tolerated treatment well    Behavior During Therapy WFL for tasks assessed/performed             Past Medical History:  Diagnosis Date   Anxiety    Breast cancer ([facility])    Depression    Diabetes mellitus without complication ([facility])    Thyroid nodule     Past Surgical History:  Procedure Laterality Date   BREAST RECONSTRUCTION WITH PLACEMENT OF TISSUE EXPANDER AND FLEX HD (ACELLULAR HYDRATED DERMIS) Bilateral 04/29/2020   Procedure: IMMEDIATE BILATERAL BREAST RECONSTRUCTION WITH PLACEMENT OF TISSUE EXPANDER AND FLEX HD (ACELLULAR HYDRATED DERMIS);  Surgeon: [name], MD;  Location: [facility];  Service: Plastics;  Laterality: Bilateral;   NIPPLE SPARING MASTECTOMY WITH SENTINEL LYMPH NODE BIOPSY Bilateral 04/29/2020   Procedure: BILATERAL NIPPLE SPARING MASTECTOMIES WITH RIGHT AXILLARY SENTINEL LYMPH NODE BIOPSY;  Surgeon: Rolm Bookbinder, MD;  Location: [facility];  Service: General;  Laterality: Bilateral;  BILATERAL PEC BLOCK, RNFA   REMOVAL OF BILATERAL TISSUE EXPANDERS WITH PLACEMENT OF BILATERAL BREAST IMPLANTS Bilateral 06/23/2020   Procedure: REMOVAL OF BILATERAL TISSUE EXPANDERS WITH PLACEMENT OF BILATERAL BREAST IMPLANTS;  Surgeon: [name], MD;  Location: [facility];  Service: Plastics;  Laterality: Bilateral;  1.5 hours    TONSILLECTOMY      There were no vitals filed for this visit.   Subjective Assessment - 03/12/21 1111     Subjective , and they say I have a cervical radiculopathy. I am frustrated that this wasn't there before the surgeries."    Pertinent History bilateral mastectomy in Apr 29 2020 with removal 2 nodes on the right with immediate exanders. Expanders were removed and implants placed Feb 28. Pt did not have chemo or radiation. She had problems with incision not healing after the implants ( took about 10 weeks). She started developing swelling in her back and under her right arm. She has a pump and has had physical therapy as her feels her range of motion in her shoulder. She has a sleeve and  gauntlet, cirucular knit, She has ordered bras from [employer] [facility] and she is uncomfortable in all of them.She has a "cushion" that goes around her back that she got from [employer] [facility]    Patient Stated Goals to get rid of the pain under her arm and return to exericse    Currently in Pain? Yes    Pain Location Axilla    Pain Orientation Right    Pain Descriptors / Indicators Aching    Pain Type Chronic pain    Pain Onset More than a month ago    Pain Frequency Intermittent    Aggravating Factors  end of the day of work.    Pain Relieving Factors stretching, TENS  [facility] PT Assessment - 03/12/21 0001       Assessment   Medical Diagnosis right breast cancer    Referring Provider (PT) Dr. Donne Hazel      AROM   AROM Assessment Site Cervical    Cervical Flexion 38    Cervical Extension 68    Cervical - Right Side Bend 30    Cervical - Left Side Bend 40    Cervical - Right Rotation 69    Cervical - Left Rotation 73      Special Tests    Special Tests Cervical    Cervical Tests Spurling's;Dictraction;other      Spurling's   Findings Negative      Distraction Test   Findngs Positive    Comment reduction of soreness in the neck                   [facility] Adult  PT Treatment/Exercise:  Therapeutic Exercise: Supine chin tuck 1 x 10 holding 5 seconds Upper trap stretch R 2 x 30 sec Upper cervical rotation 1 x 10 using 3 fingers for tactile cues    Manual Therapy: Skilled palpation and monitoring of pt during TPDN IASTM along the upper trap, cervical multifidi, and sub-occipitals Manual cervical traction x 3 min Thoracic PA grade IV T1-T8  Multiple [place] noted Sub-occipital release using tennis balls Taught how to perform at home   Neuromuscular re-ed: N/A  Therapeutic Activity: N/A  Modalities: N/A  Self Care: N/A  Consider / progression for next session:           Trigger Point Dry Needling - 03/12/21 0001     Consent Given? Yes    Education Handout Provided Previously provided    Muscles Treated Head and Neck Suboccipitals;Cervical multifidi;Upper trapezius    Upper Trapezius Response Twitch reponse elicited;Palpable increased muscle length   R   Suboccipitals Response Twitch response elicited;Palpable increased muscle length   R   Cervical multifidi Response Twitch reponse elicited;Palpable increased muscle length   R only                    PT Short Term Goals - 12/03/20 1339       PT SHORT TERM GOAL #1   Title Pt will report that knows how to use her Flexitouch and compression garments to help control the fullness she feels in her right upper quadrant    Time 4    Period Weeks    Status New      PT SHORT TERM GOAL #2   Title Pt will be independent in a basic strengthening program    Time 4    Period Weeks    Status New      PT SHORT TERM GOAL #3   Title Pt will report that the discomfort she feels in her right upper quadrant at the end of her workday is decreased by 25%    Time 4    Period Weeks    Status New               PT Long Term Goals - 01/30/21 1046       PT LONG TERM GOAL #1   Title Pt will report that the pain in her right lateral chest is decreased to a 3/10  at  the most    Baseline ranges from 4 to 9/10    Time 8    Period Weeks    Status On-going    Target  Date 03/13/21      PT LONG TERM GOAL #2   Title Pt will decrease her Quick DASH score to < 40    Baseline 45%    Time 8    Period Weeks    Status On-going    Target Date 03/13/21      PT LONG TERM GOAL #3   Title Pt will increase the grip strength of ther right hand to > 25 pounds    Baseline 5 lbs right, 8 lbs left    Time 8    Period Weeks    Status On-going      PT LONG TERM GOAL #4   Title Pt will be independent in a home exercise program for continued fitness and decreased lymphedema risk    Time 8    Period Weeks    Status Achieved                   Plan - 03/12/21 1133     Clinical Impression Statement pt arrives to session in an increased emotional state after seeing her neurologist and noting she had cervical radiculopathy. Continued TPDN focusing on the sub-occipitals, cervical mulitifidi and upper trap followed with IASTM techniques. Further assessment revealed 1/4 positive findings regarding cluster testing for cervical radiculopathy. Focused on neck and thoracic mobility which she noted some reduction in the RUE / axillary pain.    Comorbidities right breast cancer, bilateral mastectomy, bilateral  implant with delayed healing of incision.,    PT Treatment/Interventions ADLs/Self Care Home Management;Electrical Stimulation;Moist Heat;DME Instruction;Therapeutic exercise;Neuromuscular re-education;Orthotic Fit/Training;Patient/family education;Manual techniques;Manual lymph drainage;Compression bandaging;Scar mobilization;Passive range of motion;Dry needling;Taping;Vasopneumatic Device    PT Next Visit Plan ERO, how was DN for the neck ,Has cryotherapy been helpful? soft tissue work , myofascial release and mobilization stretching to right scapular area, response to DN, GHJ mobs for PA, and posture education, scapulohumeral rhythm.    PT Home Exercise Plan End Rt  shoulder A/ROM stretching in doorway throughout her day -  [id number]; "snow angel" for bil abduction in supine and standing with back against wall; try TENS unit and cryotherapy at Rt lateral trunk    Consulted and Agree with Plan of Care Patient             Patient will benefit from skilled therapeutic intervention in order to improve the following deficits and impairments:  Decreased activity tolerance, Decreased endurance, Decreased knowledge of use of DME, Decreased range of motion, Decreased mobility, Decreased strength, Decreased scar mobility, Increased edema, Increased fascial restricitons, Impaired flexibility, Impaired UE functional use, Increased muscle spasms, Postural dysfunction, Pain  Visit Diagnosis: Muscle weakness (generalized)  Stiffness of right shoulder joint  Chronic right shoulder pain  Aftercare following surgery for neoplasm     Problem List Patient Active Problem List   Diagnosis Date Noted   Cervical radiculopathy at C6 03/12/2021   Breast cancer ([place]) 04/29/2020   Genetic testing 01/14/2020   Ductal carcinoma in situ (DCIS) of right breast 01/02/2020    [place] PT, DPT, LAT, ATC  03/12/21  12:00 PM     [facility] [address] [place], [place], [address] Phone: [phone]   Fax:  [phone]  Name: Natalie Cardenas MRN: [id number] Date of Birth: 31-Jan-1976

## 2021-03-12 NOTE — Progress Notes (Signed)
[facility] [place], [place] [address] [phone]   Interval Evaluation  Date of Service: 03/12/21 Patient Name: Natalie Cardenas Patient MRN: [id number] Patient DOB: [birth year] Provider: Ventura Sellers, MD  Identifying Statement:  Natalie Cardenas is a 45 y.o. female with radiculopathy  Primary Cancer:  Oncologic History: Oncology History Overview Note  Cancer Staging Ductal carcinoma in situ (DCIS) of right breast Staging form: Breast, AJCC 8th Edition - Clinical stage from 12/28/2019: Stage 0 (cTis ([place]), cN0, cM0, GX, ER+, PR+, HER2: Not Assessed) - Signed by Truitt Merle, MD on 01/02/2020    Ductal carcinoma in situ (DCIS) of right breast  12/14/2019 Mammogram   IMPRESSION: Suspicious calcifications in the OUTER RETROAREOLAR RIGHT breast spanning a distance of 3.5 cm. Tissue sampling is recommended.   12/28/2019 Cancer Staging   Staging form: Breast, AJCC 8th Edition - Clinical stage from 12/28/2019: Stage 0 (cTis ([place]), cN0, cM0, GX, ER+, PR+, HER2: Not Assessed) - Signed by Truitt Merle, MD on 01/02/2020    12/28/2019 Initial Biopsy   Diagnosis 1. Breast, right, needle core biopsy, lateral, anterior - DUCTAL CARCINOMA IN SITU, HIGH-GRADE WITH FOCAL NECROSIS AND CALCIFICATIONS. SEE NOTE 2. Breast, right, needle core biopsy, 12 o'clock, retroareolar - DUCTAL CARCINOMA IN SITU, HIGH-GRADE WITH FOCAL NECROSIS AND CALCIFICATIONS. SEE NOTE   Diagnosis Note 1. and 2. DCIS measures 0.4 cm in part 1 and 0.2 cm in part 2 in greatest linear dimension. Dr. Tresa Moore reviewed the case and concurs with the diagnosis. A breast prognostic profile (ER, PR) is pending and will be reported in an addendum. [facility] was notified on 01/01/2020.   12/28/2019 Receptors her2   1. PROGNOSTIC INDICATORS Results: IMMUNOHISTOCHEMICAL AND MORPHOMETRIC ANALYSIS PERFORMED MANUALLY Estrogen Receptor: 95%, POSITIVE, STRONG STAINING  INTENSITY Progesterone Receptor: 70%, POSITIVE, STRONG STAINING INTENSITY   01/02/2020 Initial Diagnosis   Ductal carcinoma in situ (DCIS) of right breast   01/11/2020 Breast MRI   IMPRESSION: 1. Biopsy-proven DCIS involving the subareolar location and LOWER OUTER QUADRANT of the RIGHT breast with maximum craniocaudal measurement of 3.8 cm. The non-mass enhancement extends to just behind the nipple. 2. Indeterminate non-mass enhancement involving the LOWER OUTER QUADRANT of the LEFT breast at MIDDLE to POSTERIOR depth spanning 4.0 cm. 3. No pathologic lymphadenopathy.   01/14/2020 Genetic Testing   Negative genetic testing.  KIT c.1879C>T (p.Pro627Ser) and PDGFRA c.1436G>A (p.Arg479Gln) VUS identified on the Multi-cancer panel.  The Multi-Gene Panel offered by [facility] includes sequencing and/or deletion duplication testing of the following 85 genes: AIP, ALK, APC, ATM, AXIN2,BAP1,  BARD1, BLM, BMPR1A, BRCA1, BRCA2, BRIP1, CASR, CDC73, CDH1, CDK4, CDKN1B, CDKN1C, CDKN2A (p14ARF), CDKN2A (p16INK4a), CEBPA, CHEK2, CTNNA1, DICER1, DIS3L2, EGFR (c.2369C>T, p.Thr790Met variant only), EPCAM (Deletion/duplication testing only), FH, FLCN, GATA2, GPC3, GREM1 (Promoter region deletion/duplication testing only), HOXB13 (c.251G>A, p.Gly84Glu), HRAS, KIT, MAX, MEN1, MET, MITF (c.952G>A, p.Glu318Lys variant only), MLH1, MSH2, MSH3, MSH6, MUTYH, NBN, NF1, NF2, NTHL1, PALB2, PDGFRA, PHOX2B, PMS2, POLD1, POLE, POT1, PRKAR1A, PTCH1, PTEN, RAD50, RAD51C, RAD51D, RB1, RECQL4, RET, RNF43, RUNX1, SDHAF2, SDHA (sequence changes only), SDHB, SDHC, SDHD, SMAD4, SMARCA4, SMARCB1, SMARCE1, STK11, SUFU, TERC, TERT, TMEM127, TP53, TSC1, TSC2, VHL, WRN and WT1.  The report date is January 14, 2020.   02/01/2020 Pathology Results   Diagnosis Breast, left, needle core biopsy, lower outer - FIBROADENOMATOID CHANGES, DUCT ECTASIA, AND FIBROCYSTIC CHANGES WITH APOCRINE METAPLASIA - NO MALIGNANCY IDENTIFIED Microscopic  Comment These results were called to  [facility] on February 04, 2020.   02/08/2020 - 02/2020 Anti-estrogen oral therapy   She was interested in chemoprevention with Tamoxifen and took this for 3 weeks before surgery but stopped due to poor toleration. Given b/l mastectomy for DCIS she does not need antiestrogen therapy.    04/29/2020 Surgery   BILATERAL NIPPLE SPARING MASTECTOMIES WITH RIGHT AXILLARY SENTINEL LYMPH NODE BIOPSY bu Dr Donne Hazel    IMMEDIATE BILATERAL BREAST RECONSTRUCTION WITH PLACEMENT OF TISSUE EXPANDER AND FLEX HD (ACELLULAR HYDRATED DERMIS) by Dr Claudia Desanctis    04/29/2020 Pathology Results   FINAL MICROSCOPIC DIAGNOSIS:   A. BREAST, LEFT, MASTECTOMY:  -  Benign breast tissue with duct ectasia, fibroadenomatoid and  fibrocystic changes  -  No malignancy identified   B. BREAST, LEFT NIPPLE, BIOPSY:  -  Benign breast tissue with duct ectasia  -  No malignancy identified   C. BREAST, RIGHT, MASTECTOMY:  -  Ductal carcinoma in situ, high grade, 0.8cm  -  Margins uninvolved by carcinoma (<0.1 cm; anterior margin)  -  Fibrocystic changes and duct ectasia with periductular chronic  inflammation  -  Previous biopsy site changes  -  See oncology table below   D. BREAST, RIGHT NIPPLE, BIOPSY:  -  Benign breast tissue  -  No malignancy identified   E. LYMPH NODE, RIGHT AXILLARY, SENTINEL EXCISION:  -  No carcinoma identified in one lymph node (0/1)   F. SKIN, LEFT MASTECTOMY FLAP, EXCISION:  -  Benign skin  -  No malignancy identified   G. SKIN, RIGHT MASTECTOMY FLAP, EXCISION:  -  Benign skin  -  No malignancy identified    04/29/2020 Cancer Staging   Staging form: Breast, AJCC 8th Edition - Pathologic stage from 04/29/2020: Stage 0 (pTis (DCIS), pN0, cM0, G3, ER+, PR+, HER2: Not Assessed) - Signed by Alla Feeling, NP on 01/28/2021 Histologic grading system: 3 grade system    01/28/2021 Survivorship   SCP delivered by Cira Rue, NP      Interval History: [name] presents today for follow up after cervical spine MRI.  She describes no real improvement in her sensory loss and neck/arm pain.  Maybe increasing the dose of lyrica helped modestly.  She has been on oral prednisone for respiratory issues and it has not helped the symptoms.  She remains active with both PT and dry needling.  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  H+P (02/16/21) Patient presents today with neuropathic symptoms.  She describes pain, sensory changes described as "like constant sandpaper" under her right arm and shoulder.  This has been present in recent months, but she can't pinpoint an exact start date with all of her other concurrent medical issues.  Severity is moderate to severe, not improving over time.  She describes episodes of pain which run down her right arm to her fingers, and also can spread up to the back of her neck and even right side of her face.  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  She does have some grip weakness but only noticeable when opening tight jar lids.  No issues with her gait, balance.  Continues on hormone therapy, never underwent breast or axillary radiation therapy.  She has been dosing lyrica 54m three times per day after poor tolerance of oral gabapentin.  Medications: Current Outpatient Medications on File Prior to Visit  Medication Sig Dispense  Refill   acarbose (PRECOSE) 25 MG tablet Take by mouth daily. (Patient not taking: Reported on 02/16/2021)     acetaminophen (TYLENOL) 500 MG tablet Take 500 mg by mouth every 6 (six) hours as needed.     albuterol (VENTOLIN HFA) 108 (90 Base) MCG/ACT inhaler  (Patient not taking: Reported on 02/16/2021)     ASMANEX HFA 200 MCG/ACT AERO  (Patient not taking: No sig reported)     clonazePAM (KLONOPIN) 0.5 MG tablet Take 1 tablet (0.5 mg total) by mouth 2 (two) times  daily as needed. (Patient not taking: Reported on 02/16/2021) 30 tablet 0   diphenhydrAMINE (BENADRYL) 25 MG tablet Take 25 mg by mouth at bedtime as needed. (Patient not taking: Reported on 02/16/2021)     ferrous sulfate 325 (65 FE) MG tablet Take by mouth.     ibuprofen (ADVIL) 600 MG tablet Take 600 mg by mouth every 6 (six) hours as needed.     methocarbamol (ROBAXIN) 500 MG tablet Take 1 tablet (500 mg total) by mouth every 6 (six) hours as needed for muscle spasms. (Patient not taking: Reported on 02/16/2021) 20 tablet 0   ondansetron (ZOFRAN) 4 MG tablet Take 1 tablet (4 mg total) by mouth every 8 (eight) hours as needed for nausea or vomiting. (Patient not taking: No sig reported) 20 tablet 0   pregabalin (LYRICA) 50 MG capsule Take 1 capsule (50 mg total) by mouth 2 (two) times daily. 60 capsule 3   Prenatal Vit-Fe Fumarate-FA (PRENATAL VITAMIN PO) Take by mouth.     Probiotic Product (PROBIOTIC DAILY PO) Take by mouth.     sennosides-docusate sodium (SENOKOT-S) 8.6-50 MG tablet Take 3 tablets by mouth at bedtime. (Patient not taking: Reported on 02/16/2021)     UNABLE TO FIND daily. Med Name: vitamin b 3     No current facility-administered medications on file prior to visit.    Allergies:  Allergies  Allergen Reactions   Other Rash    Chlorahexadine wipe/soap   Past Medical History:  Past Medical History:  Diagnosis Date   Anxiety    Breast cancer ([facility])    Depression    Diabetes mellitus without complication ([facility])    Thyroid nodule    Past Surgical History:  Past Surgical History:  Procedure Laterality Date   BREAST RECONSTRUCTION WITH PLACEMENT OF TISSUE EXPANDER AND FLEX HD (ACELLULAR HYDRATED DERMIS) Bilateral 04/29/2020   Procedure: IMMEDIATE BILATERAL BREAST RECONSTRUCTION WITH PLACEMENT OF TISSUE EXPANDER AND FLEX HD (ACELLULAR HYDRATED DERMIS);  Surgeon: [name], MD;  Location: [facility];  Service: Plastics;  Laterality: Bilateral;   NIPPLE  SPARING MASTECTOMY WITH SENTINEL LYMPH NODE BIOPSY Bilateral 04/29/2020   Procedure: BILATERAL NIPPLE SPARING MASTECTOMIES WITH RIGHT AXILLARY SENTINEL LYMPH NODE BIOPSY;  Surgeon: Rolm Bookbinder, MD;  Location: [facility];  Service: General;  Laterality: Bilateral;  BILATERAL PEC BLOCK, RNFA   REMOVAL OF BILATERAL TISSUE EXPANDERS WITH PLACEMENT OF BILATERAL BREAST IMPLANTS Bilateral 06/23/2020   Procedure: REMOVAL OF BILATERAL TISSUE EXPANDERS WITH PLACEMENT OF BILATERAL BREAST IMPLANTS;  Surgeon: [name], MD;  Location: [facility];  Service: Plastics;  Laterality: Bilateral;  1.5 hours   TONSILLECTOMY     Social History:  Social History   Socioeconomic History   Marital status: Married    Spouse name: Not on file   Number of children: 4   Years of education: Not on file   Highest education level: Not on file  Occupational  History   Not on file  Tobacco Use   Smoking status: Never   Smokeless tobacco: Never  Vaping Use   Vaping Use: Never used  Substance and Sexual Activity   Alcohol use: Yes    Comment: social    Drug use: Never   Sexual activity: Yes  Other Topics Concern   Not on file  Social History Narrative   Not on file   Social Determinants of Health   Financial Resource Strain: Not on file  Food Insecurity: Not on file  Transportation Needs: Not on file  Physical Activity: Not on file  Stress: Not on file  Social Connections: Not on file  Intimate Partner Violence: Not on file   Family History:  Family History  Problem Relation Age of Onset   Cancer Father 61       esophageal cancer   Cancer Paternal Grandfather        esophageal cancer    Brain cancer Paternal Grandmother     Review of Systems: Constitutional: Doesn't report fevers, chills or abnormal weight loss Eyes: Doesn't report blurriness of vision Ears, nose, mouth, throat, and face: Doesn't report sore throat Respiratory: Doesn't report cough, dyspnea  or wheezes Cardiovascular: Doesn't report palpitation, chest discomfort  Gastrointestinal:  Doesn't report nausea, constipation, diarrhea GU: Doesn't report incontinence Skin: Doesn't report skin rashes Neurological: Per HPI Musculoskeletal: Doesn't report joint pain Behavioral/Psych: Doesn't report anxiety  Physical Exam: Vitals:   03/12/21 0943  BP: (!) 143/87  Pulse: 70  Resp: 20  Temp: 97.9 F (36.6 C)  SpO2: 100%    KPS: 80. General: Alert, cooperative, pleasant, in no acute distress Head: Normal EENT: No conjunctival injection or scleral icterus.  Lungs: Resp effort normal Cardiac: Regular rate Abdomen: Non-distended abdomen Skin: No rashes cyanosis or petechiae. Extremities: No clubbing or edema  Neurologic Exam: Mental Status: Awake, alert, attentive to examiner. Oriented to self and environment. Language is fluent with intact comprehension.  Cranial Nerves: Visual acuity is grossly normal. Visual fields are full. Extra-ocular movements intact. No ptosis. Face is symmetric Motor: Tone and bulk are normal. Power is full in both arms and legs. Reflexes are symmetric, no pathologic reflexes present.  Sensory: Intact to light touch Gait: Normal.   Labs: I have reviewed the data as listed    Component Value Date/Time   NA 138 06/18/2020 1455   K 4.0 06/18/2020 1455   CL 104 06/18/2020 1455   CO2 25 06/18/2020 1455   GLUCOSE 120 (H) 06/18/2020 1455   BUN 11 06/18/2020 1455   CREATININE 0.59 06/18/2020 1455   CREATININE 0.73 01/07/2020 1619   CALCIUM 9.4 06/18/2020 1455   PROT 6.6 01/07/2020 1619   ALBUMIN 3.7 01/07/2020 1619   AST 16 01/07/2020 1619   ALT 15 01/07/2020 1619   ALKPHOS 86 01/07/2020 1619   BILITOT 0.5 01/07/2020 1619   GFRNONAA >60 06/18/2020 1455   GFRNONAA >60 01/07/2020 1619   GFRAA >60 01/07/2020 1619   Lab Results  Component Value Date   WBC 7.1 01/07/2020   NEUTROABS 4.4 01/07/2020   HGB 11.1 (L) 01/07/2020   HCT 34.3 (L)  01/07/2020   MCV 96.9 01/07/2020   PLT 222 01/07/2020   Imaging:  [facility] Clinician Interpretation: I have personally reviewed the CNS images as listed from MRI dated 03/05/21.  My interpretation, in the context of the patient's clinical presentation, is  right C5-C6 neuroforaminal stenosis    Assessment/Plan Cervical radiculopathy at [place] is  clinically unchanged from right C5/6 radiculopathy, neuroforaminal stenosis secondary to disc extrusion.  Cervical spine MRI images were reviewed and discussed with her.  We suspect underlying etiology is occupational, exacerbated by surgical positioning discussed previously.  We discussed trial of maximizing conservative efforts; though engaged with PT, NSAIDs, oral steroids, she still has not pursued occupational rest.  We discussed a ~2 week recovery period at home from work.  For pain/symptoms, we recommended increasing Lyrica to 55m BID if tolerated and improved.  If the above is ineffective, she will be agreeable to neurosurgical referral.  All questions were answered. The patient knows to call the clinic with any problems, questions or concerns. No barriers to learning were detected.  The total time spent in the encounter was 40 minutes and more than 50% was on counseling and review of test results   ZVentura Sellers MD Medical Director of Neuro-Oncology [facility] WHol[facility]11/17/22 9:46 AM

## 2021-03-13 ENCOUNTER — Ambulatory Visit: Payer: Managed Care, Other (non HMO) | Admitting: Rehabilitation

## 2021-03-13 ENCOUNTER — Encounter: Payer: Self-pay | Admitting: Rehabilitation

## 2021-03-13 DIAGNOSIS — M6281 Muscle weakness (generalized): Secondary | ICD-10-CM

## 2021-03-13 DIAGNOSIS — Z483 Aftercare following surgery for neoplasm: Secondary | ICD-10-CM

## 2021-03-13 DIAGNOSIS — M25611 Stiffness of right shoulder, not elsewhere classified: Secondary | ICD-10-CM

## 2021-03-13 DIAGNOSIS — I972 Postmastectomy lymphedema syndrome: Secondary | ICD-10-CM

## 2021-03-13 DIAGNOSIS — G8929 Other chronic pain: Secondary | ICD-10-CM

## 2021-03-13 NOTE — Therapy (Signed)
[facility] @ [place], [place], [address] Phone: [phone]   Fax:  [phone]  Physical Therapy Treatment  Patient Details  Name: Natalie Cardenas MRN: [id number] Date of Birth: 03-27-1976 Referring Provider (PT): Dr. Donne Hazel   Encounter Date: 03/13/2021   PT End of Session - 03/13/21 1219     Visit Number 19    Number of Visits 27    Date for PT Re-Evaluation 04/24/21    Authorization - Visit Number 21    Authorization - Number of Visits 47    PT Start Time 1109    PT Stop Time 1202    PT Time Calculation (min) 53 min    Activity Tolerance Patient tolerated treatment well    Behavior During Therapy WFL for tasks assessed/performed             Past Medical History:  Diagnosis Date   Anxiety    Breast cancer ([facility])    Depression    Diabetes mellitus without complication ([facility])    Thyroid nodule     Past Surgical History:  Procedure Laterality Date   BREAST RECONSTRUCTION WITH PLACEMENT OF TISSUE EXPANDER AND FLEX HD (ACELLULAR HYDRATED DERMIS) Bilateral 04/29/2020   Procedure: IMMEDIATE BILATERAL BREAST RECONSTRUCTION WITH PLACEMENT OF TISSUE EXPANDER AND FLEX HD (ACELLULAR HYDRATED DERMIS);  Surgeon: [name], MD;  Location: [facility];  Service: Plastics;  Laterality: Bilateral;   NIPPLE SPARING MASTECTOMY WITH SENTINEL LYMPH NODE BIOPSY Bilateral 04/29/2020   Procedure: BILATERAL NIPPLE SPARING MASTECTOMIES WITH RIGHT AXILLARY SENTINEL LYMPH NODE BIOPSY;  Surgeon: Rolm Bookbinder, MD;  Location: [facility];  Service: General;  Laterality: Bilateral;  BILATERAL PEC BLOCK, RNFA   REMOVAL OF BILATERAL TISSUE EXPANDERS WITH PLACEMENT OF BILATERAL BREAST IMPLANTS Bilateral 06/23/2020   Procedure: REMOVAL OF BILATERAL TISSUE EXPANDERS WITH PLACEMENT OF BILATERAL BREAST IMPLANTS;  Surgeon: [name], MD;  Location: [facility];  Service: Plastics;   Laterality: Bilateral;  1.5 hours   TONSILLECTOMY      There were no vitals filed for this visit.   Subjective Assessment - 03/13/21 1106     Subjective I am just so frustrated with how nobody can tell me why the back of the arm still hurts.  I know I have the neck issue but it has to be separate.    Pertinent History bilateral mastectomy in Apr 29 2020 with removal 2 nodes on the right with immediate exanders. Expanders were removed and implants placed Feb 28. Pt did not have chemo or radiation. She had problems with incision not healing after the implants ( took about 10 weeks). She started developing swelling in her back and under her right arm. She has a pump and has had physical therapy as her feels her range of motion in her shoulder. She has a sleeve and  gauntlet, cirucular knit, She has ordered bras from [employer] and she is uncomfortable in all of them.She has a "cushion" that goes around her back that she got from [employer]    Currently in Pain? Yes    Pain Score 4     Pain Location Arm    Pain Orientation Right;Posterior    Pain Descriptors / Indicators Aching;Burning    Pain Type Chronic pain    Pain Onset More than a month ago    Pain Frequency Constant  [facility] Adult PT Treatment/Exercise - 03/13/21 0001       Self-Care   Other Self-Care Comments  lengthy discussion regarding what this PT would continue doing for the chronic post mastectomy nerve related pain including getting lyrica dose effective which pt is doing starting today by increasing it, continue with postural strengthening, thoracic and mid back opening and joint mobilization, needling if it helps, and POC      Exercises   Other Exercises  supine over full foam roll: alternating flexion with Rt UE lacking about 6" compared to the left which can touch the table, bil protraction/retraction x 10, toe reaches, horizontal roll thoracic release at various  levels, and then sidelying lat relase with active arm IR/ER and flexion for MWM      Manual Therapy   Manual Therapy Taping    Kinesiotex Facilitate Muscle      Kinesiotix   Facilitate Muscle  was not able to tape pt due to massage today but educated her on self tape application using a Y strip; anchor at the latissimus insertion with one strip running down and medial and the other over the shoulder blade at 25-50% pull                       PT Short Term Goals - 12/03/20 1339       PT SHORT TERM GOAL #1   Title Pt will report that knows how to use her Flexitouch and compression garments to help control the fullness she feels in her right upper quadrant    Time 4    Period Weeks    Status New      PT SHORT TERM GOAL #2   Title Pt will be independent in a basic strengthening program    Time 4    Period Weeks    Status New      PT SHORT TERM GOAL #3   Title Pt will report that the discomfort she feels in her right upper quadrant at the end of her workday is decreased by 25%    Time 4    Period Weeks    Status New               PT Long Term Goals - 03/13/21 1227       PT LONG TERM GOAL #1   Title Pt will report that the pain in her right lateral arm is decreased to a 3/10  at the most    Status On-going      PT LONG TERM GOAL #2   Title Pt will decrease her Quick DASH score to < 40    Status On-going      PT LONG TERM GOAL #3   Title Pt will increase the grip strength of ther right hand to > 25 pounds    Status On-going      PT LONG TERM GOAL #4   Title Pt will be independent in a home exercise program for continued fitness and decreased lymphedema risk    Status Achieved                   Plan - 03/13/21 1221     Clinical Impression Statement Pt is very frustrated regarding new diagnosis of cervical radiculopathy because she thinks that yes she does have this but it does not explain the constant nerve related pain from after surgery.   Discussed with pt that this is most likely 2 different issues,  post mastectomy pain/nerve related pain in combination with neck radiculopathy and neck related pain from work.  Discussed that she is doing everything she can do try and decrease the tricep region nerve pain/lat nerve pain and thatthis is a very common complaint post mastectomy for up to 1.5-2 years.  Note was sent to oncology regarding work as she was instructed by Dr. Mickeal Skinner to take a 2 week break but she does not know if she should do this vs go to part time, etc.  Pt will switch to one provider orthopedic focus at this point but knows she can return at any point here.    PT Frequency 2x / week    PT Duration 6 weeks    PT Treatment/Interventions ADLs/Self Care Home Management;Electrical Stimulation;Moist Heat;DME Instruction;Therapeutic exercise;Neuromuscular re-education;Orthotic Fit/Training;Patient/family education;Manual techniques;Manual lymph drainage;Compression bandaging;Scar mobilization;Passive range of motion;Dry needling;Taping;Vasopneumatic Device    PT Next Visit Plan DN, MFR/STM as needed, cervical and postural strengthing, lat/mid back release    Consulted and Agree with Plan of Care Patient             Patient will benefit from skilled therapeutic intervention in order to improve the following deficits and impairments:     Visit Diagnosis: Muscle weakness (generalized)  Stiffness of right shoulder joint  Chronic right shoulder pain  Aftercare following surgery for neoplasm  Postmastectomy lymphedema     Problem List Patient Active Problem List   Diagnosis Date Noted   Cervical radiculopathy at C6 03/12/2021   Breast cancer ([facility]) 04/29/2020   Genetic testing 01/14/2020   Ductal carcinoma in situ (DCIS) of right breast 01/02/2020    Stark Bray, PT 03/13/2021, 12:28 PM  [facility] @ [place], [place], [address] Phone:  [phone]   Fax:  [phone]  Name: Tashianna Broome MRN: [id number] Date of Birth: Sep 08, 1975

## 2021-03-16 ENCOUNTER — Other Ambulatory Visit: Payer: Self-pay | Admitting: Internal Medicine

## 2021-03-16 ENCOUNTER — Telehealth: Payer: Self-pay | Admitting: *Deleted

## 2021-03-16 DIAGNOSIS — M5412 Radiculopathy, cervical region: Secondary | ICD-10-CM

## 2021-03-16 NOTE — Telephone Encounter (Signed)
Referral faxed to [facility] [phone]

## 2021-03-23 ENCOUNTER — Encounter: Payer: Self-pay | Admitting: *Deleted

## 2021-03-23 ENCOUNTER — Ambulatory Visit: Payer: Managed Care, Other (non HMO)

## 2021-03-24 ENCOUNTER — Other Ambulatory Visit: Payer: Self-pay

## 2021-03-24 ENCOUNTER — Ambulatory Visit: Payer: Managed Care, Other (non HMO)

## 2021-03-24 ENCOUNTER — Ambulatory Visit: Payer: Managed Care, Other (non HMO) | Admitting: Physical Therapy

## 2021-03-24 ENCOUNTER — Encounter: Payer: Self-pay | Admitting: Physical Therapy

## 2021-03-24 VITALS — Wt 148.4 lb

## 2021-03-24 DIAGNOSIS — M6281 Muscle weakness (generalized): Secondary | ICD-10-CM

## 2021-03-24 DIAGNOSIS — M25611 Stiffness of right shoulder, not elsewhere classified: Secondary | ICD-10-CM

## 2021-03-24 DIAGNOSIS — G8929 Other chronic pain: Secondary | ICD-10-CM

## 2021-03-24 DIAGNOSIS — Z483 Aftercare following surgery for neoplasm: Secondary | ICD-10-CM

## 2021-03-24 NOTE — Therapy (Signed)
[facility] [place], [place], [address] Phone: [phone]   Fax:  [phone]  Physical Therapy Treatment  Patient Details  Name: Natalie Cardenas MRN: [id number] Date of Birth: Jul 04, 1975 Referring Provider (PT): Dr. Donne Hazel   Encounter Date: 03/24/2021   PT End of Session - 03/24/21 1017     Visit Number 20    Number of Visits 27    Date for PT Re-Evaluation 04/24/21    PT Start Time 1017    PT Stop Time 1100    PT Time Calculation (min) 43 min    Activity Tolerance Patient tolerated treatment well    Behavior During Therapy [facility] for tasks assessed/performed             Past Medical History:  Diagnosis Date   Anxiety    Breast cancer ([facility])    Depression    Diabetes mellitus without complication ([facility])    Thyroid nodule     Past Surgical History:  Procedure Laterality Date   BREAST RECONSTRUCTION WITH PLACEMENT OF TISSUE EXPANDER AND FLEX HD (ACELLULAR HYDRATED DERMIS) Bilateral 04/29/2020   Procedure: IMMEDIATE BILATERAL BREAST RECONSTRUCTION WITH PLACEMENT OF TISSUE EXPANDER AND FLEX HD (ACELLULAR HYDRATED DERMIS);  Surgeon: [name], MD;  Location: [facility];  Service: Plastics;  Laterality: Bilateral;   NIPPLE SPARING MASTECTOMY WITH SENTINEL LYMPH NODE BIOPSY Bilateral 04/29/2020   Procedure: BILATERAL NIPPLE SPARING MASTECTOMIES WITH RIGHT AXILLARY SENTINEL LYMPH NODE BIOPSY;  Surgeon: Rolm Bookbinder, MD;  Location: [facility];  Service: General;  Laterality: Bilateral;  BILATERAL PEC BLOCK, RNFA   REMOVAL OF BILATERAL TISSUE EXPANDERS WITH PLACEMENT OF BILATERAL BREAST IMPLANTS Bilateral 06/23/2020   Procedure: REMOVAL OF BILATERAL TISSUE EXPANDERS WITH PLACEMENT OF BILATERAL BREAST IMPLANTS;  Surgeon: [name], MD;  Location: [facility];  Service: Plastics;  Laterality: Bilateral;  1.5 hours   TONSILLECTOMY      There were no vitals filed  for this visit.   Subjective Assessment - 03/24/21 1018     Subjective "I actually feel better today, its a good a day. The last 2 weeks I was sore from working 3 days ina row. I am sore otday but not like I was previously.    Patient Stated Goals to get rid of the pain under her arm and return to exericse    Currently in Pain? Yes    Pain Location Arm                     [facility] Adult PT Treatment/Exercise:  Therapeutic Exercise: Thoracic extension 2 x 10 over pink bolster Shoulder ER 3 x going to fatigue with RTB  Manual Therapy:  Sub-occipital release Upper trap and levator scapulae  MTPR  on the R C3-C7 R lateral gapping mobs Grade III Manual cervical traction  Neuromuscular re-ed: N/A  Therapeutic Activity: N/A  Modalities: N/A  Self Care: N/A  Consider / progression for next session:                      PT Short Term Goals - 12/03/20 1339       PT SHORT TERM GOAL #1   Title Pt will report that knows how to use her Flexitouch and compression garments to help control the fullness she feels in her right upper quadrant    Time 4    Period Weeks    Status New  PT SHORT TERM GOAL #2   Title Pt will be independent in a basic strengthening program    Time 4    Period Weeks    Status New      PT SHORT TERM GOAL #3   Title Pt will report that the discomfort she feels in her right upper quadrant at the end of her workday is decreased by 25%    Time 4    Period Weeks    Status New               PT Long Term Goals - 03/13/21 1227       PT LONG TERM GOAL #1   Title Pt will report that the pain in her right lateral arm is decreased to a 3/10  at the most    Status On-going      PT LONG TERM GOAL #2   Title Pt will decrease her Quick DASH score to < 40    Status On-going      PT LONG TERM GOAL #3   Title Pt will increase the grip strength of ther right hand to > 25 pounds    Status On-going      PT LONG TERM GOAL  #4   Title Pt will be independent in a home exercise program for continued fitness and decreased lymphedema risk    Status Achieved                   Plan - 03/24/21 1100     Clinical Impression Statement pt arrives to session today in better spirits noting she is doing better. Cotninued focus muscle tension with manual techniques in the upper trap and sub-occipitals followed with mobs and traction. continued working on shoulder ER strengthening with focus on endurance which she fatigues quickly. End of session she noted no pain just felt fatigued.    Comorbidities right breast cancer, bilateral mastectomy, bilateral  implant with delayed healing of incision.,    PT Treatment/Interventions ADLs/Self Care Home Management;Electrical Stimulation;Moist Heat;DME Instruction;Therapeutic exercise;Neuromuscular re-education;Orthotic Fit/Training;Patient/family education;Manual techniques;Manual lymph drainage;Compression bandaging;Scar mobilization;Passive range of motion;Dry needling;Taping;Vasopneumatic Device    PT Next Visit Plan DN, MFR/STM as needed, cervical and postural strengthing, lat/mid back release, endurnace training for shoulder ER    PT Home Exercise Plan End Rt shoulder A/ROM stretching in doorway throughout her day -  [id number]; "snow angel" for bil abduction in supine and standing with back against wall; try TENS unit and cryotherapy at Rt lateral trunk    Consulted and Agree with Plan of Care Patient             Patient will benefit from skilled therapeutic intervention in order to improve the following deficits and impairments:  Decreased activity tolerance, Decreased endurance, Decreased knowledge of use of DME, Decreased range of motion, Decreased mobility, Decreased strength, Decreased scar mobility, Increased edema, Increased fascial restricitons, Impaired flexibility, Impaired UE functional use, Increased muscle spasms, Postural dysfunction, Pain  Visit  Diagnosis: Muscle weakness (generalized)  Stiffness of right shoulder joint  Chronic right shoulder pain     Problem List Patient Active Problem List   Diagnosis Date Noted   Cervical radiculopathy at C6 03/12/2021   Breast cancer ([facility]) 04/29/2020   Genetic testing 01/14/2020   Ductal carcinoma in situ (DCIS) of right breast 01/02/2020    Starr [place] PT, DPT, LAT, ATC  03/24/21  11:03 AM      [facility]  [place], [place], [address] Phone: [phone]   Fax:  [phone]  Name: Natalie Cardenas MRN: [id number] Date of Birth: 24-May-1975

## 2021-03-24 NOTE — Therapy (Signed)
[facility] @ [place], [place], [address] Phone: [phone]   Fax:  [phone]  Physical Therapy Treatment  Patient Details  Name: Natalie Cardenas MRN: [id number] Date of Birth: November 12, 1975 Referring Provider (PT): Dr. Donne Hazel   Encounter Date: 03/24/2021   PT End of Session - 03/24/21 1148     Visit Number 20   # unchanged due to screen only   PT Start Time 1142    PT Stop Time 1150    PT Time Calculation (min) 8 min    Activity Tolerance Patient tolerated treatment well    Behavior During Therapy [facility] for tasks assessed/performed             Past Medical History:  Diagnosis Date   Anxiety    Breast cancer ([facility])    Depression    Diabetes mellitus without complication ([facility])    Thyroid nodule     Past Surgical History:  Procedure Laterality Date   BREAST RECONSTRUCTION WITH PLACEMENT OF TISSUE EXPANDER AND FLEX HD (ACELLULAR HYDRATED DERMIS) Bilateral 04/29/2020   Procedure: IMMEDIATE BILATERAL BREAST RECONSTRUCTION WITH PLACEMENT OF TISSUE EXPANDER AND FLEX HD (ACELLULAR HYDRATED DERMIS);  Surgeon: [name], MD;  Location: [facility];  Service: Plastics;  Laterality: Bilateral;   NIPPLE SPARING MASTECTOMY WITH SENTINEL LYMPH NODE BIOPSY Bilateral 04/29/2020   Procedure: BILATERAL NIPPLE SPARING MASTECTOMIES WITH RIGHT AXILLARY SENTINEL LYMPH NODE BIOPSY;  Surgeon: Rolm Bookbinder, MD;  Location: [facility];  Service: General;  Laterality: Bilateral;  BILATERAL PEC BLOCK, RNFA   REMOVAL OF BILATERAL TISSUE EXPANDERS WITH PLACEMENT OF BILATERAL BREAST IMPLANTS Bilateral 06/23/2020   Procedure: REMOVAL OF BILATERAL TISSUE EXPANDERS WITH PLACEMENT OF BILATERAL BREAST IMPLANTS;  Surgeon: [name], MD;  Location: [facility];  Service: Plastics;  Laterality: Bilateral;  1.5 hours   TONSILLECTOMY      Vitals:   03/24/21 1147  Weight: 148 lb 6 oz (67.3  kg)     Subjective Assessment - 03/24/21 1142     Subjective Pt returns for her 3 month L-Dex screen.    Pertinent History bilateral mastectomy in Apr 29 2020 with removal 2 nodes on the right with immediate exanders. Expanders were removed and implants placed Feb 28. Pt did not have chemo or radiation. She had problems with incision not healing after the implants ( took about 10 weeks). She started developing swelling in her back and under her right arm. She has a pump and has had physical therapy as her feels her range of motion in her shoulder. She has a sleeve and  gauntlet, cirucular knit, She has ordered bras from [employer] and she is uncomfortable in all of them.She has a "cushion" that goes around her back that she got from [employer] - 03/24/21 1100       L-DEX LYMPHEDEMA SCREENING   Measurement Type Unilateral    L-DEX MEASUREMENT EXTREMITY Upper Extremity    POSITION  Standing    DOMINANT SIDE Right    At Risk Side Right    BASELINE SCORE (UNILATERAL) -3.4    L-DEX SCORE (UNILATERAL) -2.7    VALUE CHANGE (UNILAT) 0.7  PT Short Term Goals - 12/03/20 1339       PT SHORT TERM GOAL #1   Title Pt will report that knows how to use her Flexitouch and compression garments to help control the fullness she feels in her right upper quadrant    Time 4    Period Weeks    Status New      PT SHORT TERM GOAL #2   Title Pt will be independent in a basic strengthening program    Time 4    Period Weeks    Status New      PT SHORT TERM GOAL #3   Title Pt will report that the discomfort she feels in her right upper quadrant at the end of her workday is decreased by 25%    Time 4    Period Weeks    Status New               PT Long Term Goals - 03/13/21 1227       PT LONG TERM GOAL #1   Title Pt will report that the pain in her right lateral arm is decreased  to a 3/10  at the most    Status On-going      PT LONG TERM GOAL #2   Title Pt will decrease her Quick DASH score to < 40    Status On-going      PT LONG TERM GOAL #3   Title Pt will increase the grip strength of ther right hand to > 25 pounds    Status On-going      PT LONG TERM GOAL #4   Title Pt will be independent in a home exercise program for continued fitness and decreased lymphedema risk    Status Achieved                   Plan - 03/24/21 1151     Clinical Impression Statement Pt returns for her 3 month L-Dex screen. Her change from baseline of 0.7 is WNLs so no further treatment is required at this time except to cont every 3 month L-Dex screens which pt is agreeable to.    PT Next Visit Plan DN, MFR/STM as needed, cervical and postural strengthing, lat/mid back release, endurnace training for shoulder ER; cont every 3 month L-Dex screens for up to 2 years from her SLNB (~Jan, 2024)    Consulted and Agree with Plan of Care Patient             Patient will benefit from skilled therapeutic intervention in order to improve the following deficits and impairments:     Visit Diagnosis: Aftercare following surgery for neoplasm     Problem List Patient Active Problem List   Diagnosis Date Noted   Cervical radiculopathy at C6 03/12/2021   Breast cancer ([place]) 04/29/2020   Genetic testing 01/14/2020   Ductal carcinoma in situ (DCIS) of right breast 01/02/2020    Otelia Limes, PTA 03/24/2021, 11:53 AM  [facility] @ [place], [place], [address] Phone: [phone]   Fax:  [phone]  Name: Natalie Cardenas MRN: [id number] Date of Birth: 10-03-1975

## 2021-03-25 ENCOUNTER — Encounter: Payer: Managed Care, Other (non HMO) | Admitting: Physical Therapy

## 2021-03-27 ENCOUNTER — Ambulatory Visit: Payer: Self-pay | Admitting: Psychologist

## 2021-04-01 ENCOUNTER — Other Ambulatory Visit: Payer: Self-pay

## 2021-04-01 ENCOUNTER — Ambulatory Visit: Payer: Managed Care, Other (non HMO) | Admitting: Plastic Surgery

## 2021-04-01 DIAGNOSIS — Z411 Encounter for cosmetic surgery: Secondary | ICD-10-CM | POA: Diagnosis not present

## 2021-04-01 DIAGNOSIS — Z9889 Other specified postprocedural states: Secondary | ICD-10-CM | POA: Diagnosis not present

## 2021-04-01 NOTE — [facility] Notes (Signed)
Referring Provider Jake Samples, PA-C [address] [place],  [place] [address]   CC:  Chief Complaint  Patient presents with   Follow-up      Natalie Cardenas is an 45 y.o. female.  HPI: Patient presents in follow-up after bilateral breast reconstruction.  She had bilateral nipple sparing mastectomies and prepectoral reconstruction.  She has had quite a difficult time with pain and tightness in the right axillary area which was the site of her sentinel node in the side of her cancer.  She has been to physical therapy and made progress with range of motion of her shoulder but she still feels weak and tight on that side and has intermittent burning pain.  She can localize this to a point in the posterior aspect of the axilla which seems to be particularly sensitive.  She has seen neurology and they felt that this was related to a cervical spine stenosis and have referred her to a neurosurgeon who she will see this Friday.  Regarding her reconstruction she is reasonably satisfied.  She reports to some hollowing in the upper pole on both sides which is not uncommon with prepectoral reconstruction.  Other than that she is mostly happy with how things have healed up.  She also wants to discuss her lower eyelids.  We had discussed laser for that in the past she is bothered by the lower lid bags and excess skin.  She has no history of dry eye symptoms.  Review of Systems General: Denies fevers and chills  Physical Exam Vitals with BMI 03/24/2021 03/12/2021 02/16/2021  Height - - -  Weight 148 lbs 6 oz 147 lbs 2 oz 148 lbs 3 oz  BMI - - 18.29  Systolic - 937 169  Diastolic - 87 84  Pulse - 70 85    General:  No acute distress,  Alert and oriented, Non-Toxic, Normal speech and affect Examination of her breast reconstruction shows well-healed incisions.  She does have some settling of the implants with relative hollowing in the upper pole.  There is some rippling that is visible in certain  positions.  I can identify a specific point along the posterior axillary line that seems to generate tingling and shocking sensation when I press on it. Regarding her lower lid she has a prominent lid cheek junction particularly medially in the tear trough.  She has protrusion of post septal fat which accentuates this.  Her lower eyelid margin is in appropriate position on both sides and she has good lower lid integrity with good snapback.  Assessment/Plan Patient presents about 9 months out from her implant placement.  She has had some settling of the implants and relative hollowing in the upper pole.  She is a good candidate for fat grafting in that area.  We discussed the risks and benefits of this and the expected downtime along with the scars that would be small poke holes from liposuction and small poke holes to infiltrate the fat.  I think this would help with her contour.  Regarding the lower lids we discussed laser treatments in addition to her lower eyelid blepharoplasty.  I think she would be a reasonable candidate for a transconjunctival Bleph.  I explained that I would divide the orbital malar ligament and remove some post septal fat.  I would also plan to do a lateral canthopexy for support.  I do think it would be reasonable to do this at the same time and I can use some of  the fat to fill out the malar area and the lid cheek junction as well.  We reviewed the risks and benefits of this procedure that include bleeding, infection, damage to surrounding structures and need for additional procedures.  We reviewed the potential for lower eyelid malposition as a risk which should hopefully be minimized in her with a transconjunctival approach.  All of her questions were answered.  Natalie Cardenas 04/01/2021, 4:56 PM

## 2021-04-08 ENCOUNTER — Encounter: Payer: Managed Care, Other (non HMO) | Admitting: Physical Therapy

## 2021-04-08 ENCOUNTER — Ambulatory Visit: Payer: Managed Care, Other (non HMO) | Admitting: Physical Therapy

## 2021-04-13 ENCOUNTER — Other Ambulatory Visit: Payer: Self-pay

## 2021-04-13 ENCOUNTER — Ambulatory Visit: Payer: Managed Care, Other (non HMO) | Attending: General Surgery | Admitting: Physical Therapy

## 2021-04-13 ENCOUNTER — Encounter: Payer: Self-pay | Admitting: Physical Therapy

## 2021-04-13 DIAGNOSIS — M25511 Pain in right shoulder: Secondary | ICD-10-CM | POA: Insufficient documentation

## 2021-04-13 DIAGNOSIS — G8929 Other chronic pain: Secondary | ICD-10-CM | POA: Insufficient documentation

## 2021-04-13 DIAGNOSIS — M6281 Muscle weakness (generalized): Secondary | ICD-10-CM | POA: Diagnosis not present

## 2021-04-13 DIAGNOSIS — M25611 Stiffness of right shoulder, not elsewhere classified: Secondary | ICD-10-CM | POA: Diagnosis present

## 2021-04-13 NOTE — Therapy (Signed)
[place], [place], [address] Phone: [phone]   Fax:  [phone]  Physical Therapy Treatment  Patient Details  Name: Natalie Cardenas MRN: [id number] Date of Birth: 12-24-1975 Referring Provider (PT): Dr. Donne Hazel   Encounter Date: 04/13/2021   PT End of Session - 04/13/21 1025     Visit Number 21    Number of Visits 27    Date for PT Re-Evaluation 04/24/21    PT Start Time 2025    PT Stop Time 1101    PT Time Calculation (min) 38 min    Activity Tolerance Patient tolerated treatment well    Behavior During Therapy [facility] for tasks assessed/performed             Past Medical History:  Diagnosis Date   Anxiety    Breast cancer ([place])    Depression    Diabetes mellitus without complication ([place])    Thyroid nodule     Past Surgical History:  Procedure Laterality Date   BREAST RECONSTRUCTION WITH PLACEMENT OF TISSUE EXPANDER AND FLEX HD (ACELLULAR HYDRATED DERMIS) Bilateral 04/29/2020   Procedure: IMMEDIATE BILATERAL BREAST RECONSTRUCTION WITH PLACEMENT OF TISSUE EXPANDER AND FLEX HD (ACELLULAR HYDRATED DERMIS);  Surgeon: [name], MD;  Location: [facility];  Service: Plastics;  Laterality: Bilateral;   NIPPLE SPARING MASTECTOMY WITH SENTINEL LYMPH NODE BIOPSY Bilateral 04/29/2020   Procedure: BILATERAL NIPPLE SPARING MASTECTOMIES WITH RIGHT AXILLARY SENTINEL LYMPH NODE BIOPSY;  Surgeon: Rolm Bookbinder, MD;  Location: [facility];  Service: General;  Laterality: Bilateral;  BILATERAL PEC BLOCK, RNFA   REMOVAL OF BILATERAL TISSUE EXPANDERS WITH PLACEMENT OF BILATERAL BREAST IMPLANTS Bilateral 06/23/2020   Procedure: REMOVAL OF BILATERAL TISSUE EXPANDERS WITH PLACEMENT OF BILATERAL BREAST IMPLANTS;  Surgeon: [name], MD;  Location: [facility];  Service: Plastics;  Laterality: Bilateral;  1.5 hours   TONSILLECTOMY      There were no vitals filed  for this visit.   Subjective Assessment - 04/13/21 1026     Subjective "  I am having good and bad days, but notice more good then bad. Today I am alittle more inflammed which I am not sure why."    Currently in Pain? Yes    Pain Score 4     Pain Location Shoulder    Pain Orientation Right    Pain Descriptors / Indicators Aching    Pain Type Chronic pain    Pain Onset More than a month ago    Pain Frequency Intermittent    Aggravating Factors  end of the day of work    Pain Relieving Factors stretch, TENS                              [facility] Adult PT Treatment:     DATE: 04/13/2021 Therapeutic Exercise: Chin tuck in supine 1 x 5 holding 10 seconds Chin tuck head lift 1 x 10 holding 10 seconds Quadruped sustained chin tuck scapular protraction 2 x 12 Thoracic rotation in quadruped 2 x 10 with R hand behind her head Manual Therapy: Sub-occipital release R upper trap / levator scapulae MTPR  Manual cervical traction               PT Short Term Goals - 12/03/20 1339       PT SHORT TERM GOAL #1   Title Pt will report that knows how to use  her Flexitouch and compression garments to help control the fullness she feels in her right upper quadrant    Time 4    Period Weeks    Status New      PT SHORT TERM GOAL #2   Title Pt will be independent in a basic strengthening program    Time 4    Period Weeks    Status New      PT SHORT TERM GOAL #3   Title Pt will report that the discomfort she feels in her right upper quadrant at the end of her workday is decreased by 25%    Time 4    Period Weeks    Status New               PT Long Term Goals - 03/13/21 1227       PT LONG TERM GOAL #1   Title Pt will report that the pain in her right lateral arm is decreased to a 3/10  at the most    Status On-going      PT LONG TERM GOAL #2   Title Pt will decrease her Quick DASH score to < 40    Status On-going      PT LONG TERM GOAL #3   Title Pt  will increase the grip strength of ther right hand to > 25 pounds    Status On-going      PT LONG TERM GOAL #4   Title Pt will be independent in a home exercise program for continued fitness and decreased lymphedema risk    Status Achieved                   Plan - 04/13/21 1048     Clinical Impression Statement Pt arrives to session noted she saw a neuro surgery and that it was her axillary nerve that was impacted and to stay on the current course of treatment. continued STW along the neck/ posterior shoulder, she did well with cervical stability and thoracic mobility. end of session she noted continued stiffness/ tightness but the intense soreness/pain and calmed down. plan to see pt for one more visit to update HEP and anticipate D/C at that time.    Comorbidities right breast cancer, bilateral mastectomy, bilateral  implant with delayed healing of incision.,    PT Treatment/Interventions ADLs/Self Care Home Management;Electrical Stimulation;Moist Heat;DME Instruction;Therapeutic exercise;Neuromuscular re-education;Orthotic Fit/Training;Patient/family education;Manual techniques;Manual lymph drainage;Compression bandaging;Scar mobilization;Passive range of motion;Dry needling;Taping;Vasopneumatic Device    PT Next Visit Plan anticipate d/c,  endurnace training for shoulder ER; cont every 3 month L-Dex screens for up to 2 years from her SLNB (~Jan, 2024)    PT Home Exercise Plan End Rt shoulder A/ROM stretching in doorway throughout her day -  [id number]; "snow angel" for bil abduction in supine and standing with back against wall; try TENS unit and cryotherapy at Rt lateral trunk             Patient will benefit from skilled therapeutic intervention in order to improve the following deficits and impairments:  Decreased activity tolerance, Decreased endurance, Decreased knowledge of use of DME, Decreased range of motion, Decreased mobility, Decreased strength, Decreased scar mobility,  Increased edema, Increased fascial restricitons, Impaired flexibility, Impaired UE functional use, Increased muscle spasms, Postural dysfunction, Pain  Visit Diagnosis: Muscle weakness (generalized)  Chronic right shoulder pain     Problem List Patient Active Problem List   Diagnosis Date Noted   Cervical radiculopathy at C6 03/12/2021  Breast cancer ([place]) 04/29/2020   Genetic testing 01/14/2020   Ductal carcinoma in situ (DCIS) of right breast 01/02/2020    Starr Lake PT, DPT, LAT, ATC  04/13/21  11:03 AM      [place], [place], [address] Phone: [phone]   Fax:  [phone]  Name: Natalie Cardenas MRN: [id number] Date of Birth: 03-21-1976

## 2021-04-22 ENCOUNTER — Other Ambulatory Visit: Payer: Self-pay

## 2021-04-22 ENCOUNTER — Ambulatory Visit: Payer: Managed Care, Other (non HMO) | Admitting: Physical Therapy

## 2021-04-22 DIAGNOSIS — M6281 Muscle weakness (generalized): Secondary | ICD-10-CM

## 2021-04-22 DIAGNOSIS — M25611 Stiffness of right shoulder, not elsewhere classified: Secondary | ICD-10-CM

## 2021-04-22 DIAGNOSIS — G8929 Other chronic pain: Secondary | ICD-10-CM

## 2021-04-22 NOTE — Therapy (Signed)
[place], [place], [address] Phone: [phone]   Fax:  [phone]  Physical Therapy Treatment /  Discharge  Patient Details  Name: Natalie Cardenas MRN: [id number] Date of Birth: December 18, 1975 Referring Provider (PT): Dr. Donne Hazel   Encounter Date: 04/22/2021   PT End of Session - 04/22/21 1024     Visit Number 22    Number of Visits 27    Date for PT Re-Evaluation 04/24/21    PT Start Time 4287   pt checked in late and had to use the restroom.   PT Stop Time 1058    PT Time Calculation (min) 33 min    Activity Tolerance Patient tolerated treatment well    Behavior During Therapy WFL for tasks assessed/performed             Past Medical History:  Diagnosis Date   Anxiety    Breast cancer ([place])    Depression    Diabetes mellitus without complication ([place])    Thyroid nodule     Past Surgical History:  Procedure Laterality Date   BREAST RECONSTRUCTION WITH PLACEMENT OF TISSUE EXPANDER AND FLEX HD (ACELLULAR HYDRATED DERMIS) Bilateral 04/29/2020   Procedure: IMMEDIATE BILATERAL BREAST RECONSTRUCTION WITH PLACEMENT OF TISSUE EXPANDER AND FLEX HD (ACELLULAR HYDRATED DERMIS);  Surgeon: [name], MD;  Location: [facility];  Service: Plastics;  Laterality: Bilateral;   NIPPLE SPARING MASTECTOMY WITH SENTINEL LYMPH NODE BIOPSY Bilateral 04/29/2020   Procedure: BILATERAL NIPPLE SPARING MASTECTOMIES WITH RIGHT AXILLARY SENTINEL LYMPH NODE BIOPSY;  Surgeon: Rolm Bookbinder, MD;  Location: [facility];  Service: General;  Laterality: Bilateral;  BILATERAL PEC BLOCK, RNFA   REMOVAL OF BILATERAL TISSUE EXPANDERS WITH PLACEMENT OF BILATERAL BREAST IMPLANTS Bilateral 06/23/2020   Procedure: REMOVAL OF BILATERAL TISSUE EXPANDERS WITH PLACEMENT OF BILATERAL BREAST IMPLANTS;  Surgeon: [name], MD;  Location: [facility];  Service: Plastics;  Laterality: Bilateral;   1.5 hours   TONSILLECTOMY      There were no vitals filed for this visit.   Subjective Assessment - 04/22/21 1025     Subjective "This week I have been doing pretty good. I didn't have to work this week and I haven't had to do too much. I did get some soreness with cooking but it wasn't the worst it has been."    Patient Stated Goals to get rid of the pain under her arm and return to exericse    Currently in Pain? No/denies    Pain Orientation Right    Pain Type Chronic pain    Pain Onset More than a month ago    Pain Frequency Intermittent    Aggravating Factors  doing too much,                [facility] PT Assessment - 04/22/21 0001       Assessment   Medical Diagnosis right breast cancer    Referring Provider (PT) Dr. Donne Hazel      Strength   Right Hand Grip (lbs) 13.3   12,13,15   Left Hand Grip (lbs) 21.6   25,20,20                  Quick Dash - 04/22/21 0001     Open a tight or new jar Unable    Do heavy household chores (wash walls, wash floors) Moderate difficulty    Carry a shopping bag or briefcase Mild difficulty  Wash your back Mild difficulty    Use a knife to cut food No difficulty    Recreational activities in which you take some force or impact through your arm, shoulder, or hand (golf, hammering, tennis) Mild difficulty    During the past week, to what extent has your arm, shoulder or hand problem interfered with your normal social activities with family, friends, neighbors, or groups? Slightly    During the past week, to what extent has your arm, shoulder or hand problem limited your work or other regular daily activities Modererately    Arm, shoulder, or hand pain. Mild    Tingling (pins and needles) in your arm, shoulder, or hand Moderate    Difficulty Sleeping Moderate difficulty    DASH Score 38.64 %                    [facility] Adult PT Treatment:                                                                          DATE:  04/22/2021  Therapeutic Exercise: Extensively reviewed HEP and updated today with progression.             PT Education - 04/22/21 1117     Education Details Reviewed and updated HEP    Person(s) Educated Patient    Methods Explanation    Comprehension Verbalized understanding              PT Short Term Goals - 04/22/21 1031       PT SHORT TERM GOAL #1   Title Pt will report that knows how to use her Flexitouch and compression garments to help control the fullness she feels in her right upper quadrant    Period Weeks    Status Achieved      PT SHORT TERM GOAL #2   Title Pt will be independent in a basic strengthening program    Status Achieved      PT SHORT TERM GOAL #3   Title Pt will report that the discomfort she feels in her right upper quadrant at the end of her workday is decreased by 25%    Baseline depends on the day    Status Partially Met               PT Long Term Goals - 04/22/21 1035       PT LONG TERM GOAL #1   Title Pt will report that the pain in her right lateral arm is decreased to a 3/10  at the most    Baseline worst 6-7/10    Period Weeks    Status Partially Met      PT LONG TERM GOAL #2   Title Pt will decrease her Quick DASH score to < 40    Period Weeks    Status Achieved      PT LONG TERM GOAL #3   Title Pt will increase the grip strength of ther right hand to > 25 pounds    Baseline R 13.3, L 21    Period Weeks    Status Not Met      PT LONG TERM GOAL #4   Title Pt will be  independent in a home exercise program for continued fitness and decreased lymphedema risk    Period Weeks    Status Achieved                   Plan - 04/22/21 1029     Clinical Impression Statement Mrs Pitz has made excellent progrss with physical therapy with increased ROM, and strength. She does continue to report R axillary pain, and fatigues quickly with repetitive motions. She has made good progress toward her goals meeting /  partially meeting all goals except for LTG #3. extensively reviewed HEP and updated today with progression of strengthening. She is able to maintain and progress her current LOF IND and will be formally discharged from PT today.    Comorbidities right breast cancer, bilateral mastectomy, bilateral  implant with delayed healing of incision.,    PT Treatment/Interventions ADLs/Self Care Home Management;Electrical Stimulation;Moist Heat;DME Instruction;Therapeutic exercise;Neuromuscular re-education;Orthotic Fit/Training;Patient/family education;Manual techniques;Manual lymph drainage;Compression bandaging;Scar mobilization;Passive range of motion;Dry needling;Taping;Vasopneumatic Device    PT Next Visit Plan anticipate d/c,  endurnace training for shoulder ER; cont every 3 month L-Dex screens for up to 2 years from her SLNB (~Jan, 2024)    PT Home Exercise Plan End Rt shoulder A/ROM stretching in doorway throughout her day -  [id number]; "snow angel" for bil abduction in supine and standing with back against wall; try TENS unit and cryotherapy at Rt lateral trunk    Consulted and Agree with Plan of Care Patient             Patient will benefit from skilled therapeutic intervention in order to improve the following deficits and impairments:  Decreased activity tolerance, Decreased endurance, Decreased knowledge of use of DME, Decreased range of motion, Decreased mobility, Decreased strength, Decreased scar mobility, Increased edema, Increased fascial restricitons, Impaired flexibility, Impaired UE functional use, Increased muscle spasms, Postural dysfunction, Pain  Visit Diagnosis: No diagnosis found.     Problem List Patient Active Problem List   Diagnosis Date Noted   Cervical radiculopathy at C6 03/12/2021   Breast cancer ([place]) 04/29/2020   Genetic testing 01/14/2020   Ductal carcinoma in situ (DCIS) of right breast 01/02/2020    Starr Lake PT, DPT, LAT, ATC  04/22/21  11:20  AM      [facility] [facility] [address] [place], [place], [address] Phone: [phone]   Fax:  [phone]  Name: Maricruz Lucero MRN: [id number] Date of Birth: 1975/06/08            PHYSICAL THERAPY DISCHARGE SUMMARY  Visits from Start of Care: 22  Current functional level related to goals / functional outcomes: See goals   Remaining deficits: See assessment   Education / Equipment: HEP, theraband, posture, lifting mechanics   Patient agrees to discharge. Patient goals were partially met. Patient is being discharged due to being pleased with the current functional level.  Shakeya Kerkman PT, DPT, LAT, ATC  04/22/21  11:20 AM

## 2021-04-29 ENCOUNTER — Telehealth: Payer: Self-pay | Admitting: Plastic Surgery

## 2021-04-29 NOTE — Telephone Encounter (Signed)
Left message for patient to determine if she is ready for me to submit the authorization for surgery and if she wants to do just the fat grafting surgery or if she wants to do the lower bleph as well. Advsd patient to call office back.

## 2021-06-08 ENCOUNTER — Ambulatory Visit: Payer: Managed Care, Other (non HMO)

## 2021-07-14 ENCOUNTER — Other Ambulatory Visit: Payer: Self-pay | Admitting: General Surgery

## 2021-07-14 DIAGNOSIS — D0511 Intraductal carcinoma in situ of right breast: Secondary | ICD-10-CM

## 2021-08-11 ENCOUNTER — Other Ambulatory Visit: Payer: Managed Care, Other (non HMO)

## 2021-08-14 ENCOUNTER — Ambulatory Visit
Admission: RE | Admit: 2021-08-14 | Discharge: 2021-08-14 | Disposition: A | Discharge status: Home / Self Care | Payer: Managed Care, Other (non HMO) | Source: Ambulatory Visit | Attending: General Surgery | Admitting: General Surgery

## 2021-08-14 DIAGNOSIS — D0511 Intraductal carcinoma in situ of right breast: Secondary | ICD-10-CM

## 2021-08-14 MED ORDER — GADOBUTROL 1 MMOL/ML IV SOLN
6.0000 mL | Freq: Once | INTRAVENOUS | Status: AC | PRN
  Administered 2021-08-14: 6 mL via INTRAVENOUS

## 2021-08-27 ENCOUNTER — Encounter: Payer: Self-pay | Admitting: Plastic Surgery

## 2021-09-11 ENCOUNTER — Ambulatory Visit: Payer: Managed Care, Other (non HMO) | Attending: General Surgery | Admitting: Rehabilitation

## 2021-09-11 ENCOUNTER — Encounter: Payer: Self-pay | Admitting: Rehabilitation

## 2021-09-11 DIAGNOSIS — Z483 Aftercare following surgery for neoplasm: Secondary | ICD-10-CM | POA: Insufficient documentation

## 2021-09-11 NOTE — Therapy (Signed)
  OUTPATIENT PHYSICAL THERAPY [facility] SCREENING NOTE   Patient Name: Natalie Cardenas MRN: [id number] DOB:05-13-1975, 46 y.o., female Today's Date: 09/11/2021  PCP: Jake Samples, PA-C REFERRING PROVIDER: Rolm Bookbinder, MD   PT End of Session - 09/11/21 1103     Visit Number 22   screen only   Number of Visits 71    PT Start Time 1056    PT Stop Time 1102    PT Time Calculation (min) 6 min    Activity Tolerance Patient tolerated treatment well    Behavior During Therapy WFL for tasks assessed/performed             Past Medical History:  Diagnosis Date   Anxiety    Breast cancer ([place])    Depression    Diabetes mellitus without complication ([place])    Thyroid nodule    Past Surgical History:  Procedure Laterality Date   BREAST RECONSTRUCTION WITH PLACEMENT OF TISSUE EXPANDER AND FLEX HD (ACELLULAR HYDRATED DERMIS) Bilateral 04/29/2020   Procedure: IMMEDIATE BILATERAL BREAST RECONSTRUCTION WITH PLACEMENT OF TISSUE EXPANDER AND FLEX HD (ACELLULAR HYDRATED DERMIS);  Surgeon: [name], MD;  Location: [facility];  Service: Plastics;  Laterality: Bilateral;   NIPPLE SPARING MASTECTOMY WITH SENTINEL LYMPH NODE BIOPSY Bilateral 04/29/2020   Procedure: BILATERAL NIPPLE SPARING MASTECTOMIES WITH RIGHT AXILLARY SENTINEL LYMPH NODE BIOPSY;  Surgeon: Rolm Bookbinder, MD;  Location: [facility];  Service: General;  Laterality: Bilateral;  BILATERAL PEC BLOCK, RNFA   REMOVAL OF BILATERAL TISSUE EXPANDERS WITH PLACEMENT OF BILATERAL BREAST IMPLANTS Bilateral 06/23/2020   Procedure: REMOVAL OF BILATERAL TISSUE EXPANDERS WITH PLACEMENT OF BILATERAL BREAST IMPLANTS;  Surgeon: [name], MD;  Location: [facility];  Service: Plastics;  Laterality: Bilateral;  1.5 hours   TONSILLECTOMY     Patient Active Problem List   Diagnosis Date Noted   Cervical radiculopathy at C6 03/12/2021   Breast cancer ([place]) 04/29/2020   Genetic testing  01/14/2020   Ductal carcinoma in situ (DCIS) of right breast 01/02/2020    REFERRING DIAG: right breast cancer at risk for lymphedema  THERAPY DIAG:  Aftercare following surgery for neoplasm  PERTINENT HISTORY: bilateral mastectomy in Apr 29 2020 with removal 2 nodes on the right with immediate exanders. Expanders were removed and implants placed Feb 28. Pt did not have chemo or radiation.   PRECAUTIONS: right UE Lymphedema risk  SUBJECTIVE: nothing new   PAIN:  Are you having pain? No  SOZO SCREENING: Patient was assessed today using the SOZO machine to determine the lymphedema index score. This was compared to her baseline score. It was determined that she is within the recommended range when compared to her baseline and no further action is needed at this time. She will continue SOZO screenings. These are done every 3 months for 2 years post operatively followed by every 6 months for 2 years, and then annually.  PLAN: Continue 3 month SOZO until at least 04/29/2022  Stark Bray, PT 09/11/2021, 11:04 AM

## 2021-12-11 ENCOUNTER — Ambulatory Visit: Payer: Managed Care, Other (non HMO) | Admitting: Rehabilitation

## 2021-12-25 ENCOUNTER — Ambulatory Visit: Payer: Managed Care, Other (non HMO) | Attending: General Surgery | Admitting: Rehabilitation

## 2021-12-25 DIAGNOSIS — Z483 Aftercare following surgery for neoplasm: Secondary | ICD-10-CM | POA: Insufficient documentation

## 2021-12-25 NOTE — Therapy (Signed)
  OUTPATIENT PHYSICAL THERAPY [facility] SCREENING NOTE   Patient Name: Natalie Cardenas MRN: [id number] DOB:1975-10-26, 46 y.o., female Today's Date: 12/25/2021  PCP: Jake Samples, PA-C REFERRING PROVIDER: Rolm Bookbinder, MD     Past Medical History:  Diagnosis Date   Anxiety    Breast cancer [facility])    Depression    Diabetes mellitus without complication ([place])    Thyroid nodule    Past Surgical History:  Procedure Laterality Date   BREAST RECONSTRUCTION WITH PLACEMENT OF TISSUE EXPANDER AND FLEX HD (ACELLULAR HYDRATED DERMIS) Bilateral 04/29/2020   Procedure: IMMEDIATE BILATERAL BREAST RECONSTRUCTION WITH PLACEMENT OF TISSUE EXPANDER AND FLEX HD (ACELLULAR HYDRATED DERMIS);  Surgeon: [name], MD;  Location: [facility];  Service: Plastics;  Laterality: Bilateral;   NIPPLE SPARING MASTECTOMY WITH SENTINEL LYMPH NODE BIOPSY Bilateral 04/29/2020   Procedure: BILATERAL NIPPLE SPARING MASTECTOMIES WITH RIGHT AXILLARY SENTINEL LYMPH NODE BIOPSY;  Surgeon: Rolm Bookbinder, MD;  Location: [facility];  Service: General;  Laterality: Bilateral;  BILATERAL PEC BLOCK, RNFA   REMOVAL OF BILATERAL TISSUE EXPANDERS WITH PLACEMENT OF BILATERAL BREAST IMPLANTS Bilateral 06/23/2020   Procedure: REMOVAL OF BILATERAL TISSUE EXPANDERS WITH PLACEMENT OF BILATERAL BREAST IMPLANTS;  Surgeon: [name], MD;  Location: [facility];  Service: Plastics;  Laterality: Bilateral;  1.5 hours   TONSILLECTOMY     Patient Active Problem List   Diagnosis Date Noted   Cervical radiculopathy at C6 03/12/2021   Breast cancer ([place]) 04/29/2020   Genetic testing 01/14/2020   Ductal carcinoma in situ (DCIS) of right breast 01/02/2020    REFERRING DIAG: right breast cancer at risk for lymphedema  THERAPY DIAG:  No diagnosis found.  PERTINENT HISTORY: bilateral mastectomy in Apr 29 2020 with removal 2 nodes on the right with immediate exanders. Expanders were removed  and implants placed Feb 28. Pt did not have chemo or radiation.   PRECAUTIONS: right UE Lymphedema risk  SUBJECTIVE: nothing new   PAIN:  Are you having pain? No  SOZO SCREENING: Patient was assessed today using the SOZO machine to determine the lymphedema index score. This was compared to her baseline score. It was determined that she is within the recommended range when compared to her baseline and no further action is needed at this time. She will continue SOZO screenings. These are done every 3 months for 2 years post operatively followed by every 6 months for 2 years, and then annually.  PLAN: Continue 3 month SOZO until at least 04/29/2022  Stark Bray, PT 12/25/2021, 10:57 AM

## 2022-01-13 ENCOUNTER — Telehealth: Payer: Self-pay

## 2022-01-13 NOTE — Telephone Encounter (Signed)
Pt called stating she recently saw her PCP and they prescribed her an Estrogen/Progesterone Daily medication.  Pt is unsure if she should take this medication d/t her hx of breast cancer.  Pt would like Dr. Ernestina Penna opinion before starting this medication.  Notified Dr. Jacklynn Bue of the pt's call.

## 2022-01-15 NOTE — Telephone Encounter (Signed)
Contacted patient. LVM with Dr. Ernestina Penna response to her questions: Please let pt know that I recommend no estrogen or progesterone due to her previous history of breast cancer.   Encouraged patient to contact office with any questions

## 2022-01-19 ENCOUNTER — Encounter: Payer: Self-pay | Admitting: *Deleted

## 2022-01-28 ENCOUNTER — Inpatient Hospital Stay: Payer: Managed Care, Other (non HMO) | Admitting: Nurse Practitioner

## 2022-01-28 ENCOUNTER — Ambulatory Visit: Payer: Managed Care, Other (non HMO) | Admitting: Nurse Practitioner

## 2022-02-19 ENCOUNTER — Encounter: Payer: Self-pay | Admitting: Internal Medicine

## 2022-02-19 ENCOUNTER — Ambulatory Visit: Payer: Managed Care, Other (non HMO) | Admitting: Internal Medicine

## 2022-02-19 VITALS — BP 128/68 | HR 82 | Temp 97.6°F | Ht 65.0 in | Wt 149.8 lb

## 2022-02-19 DIAGNOSIS — J45991 Cough variant asthma: Secondary | ICD-10-CM

## 2022-02-19 DIAGNOSIS — R0602 Shortness of breath: Secondary | ICD-10-CM | POA: Diagnosis not present

## 2022-02-19 LAB — POCT EXHALED NITRIC OXIDE: FeNO level (ppb): 15

## 2022-02-19 MED ORDER — PREDNISONE 10 MG PO TABS: ORAL_TABLET | ORAL | 0 refills | Status: DC

## 2022-02-19 MED ORDER — FAMOTIDINE 20 MG PO TABS: ORAL_TABLET | ORAL | 11 refills | Status: DC

## 2022-02-19 MED ORDER — BUDESONIDE-FORMOTEROL FUMARATE 80-4.5 MCG/ACT IN AERO: INHALATION_SPRAY | RESPIRATORY_TRACT | 12 refills | Status: DC

## 2022-02-19 MED ORDER — PANTOPRAZOLE SODIUM 40 MG PO TBEC: 40.0000 mg | DELAYED_RELEASE_TABLET | Freq: Every day | ORAL | 2 refills | Status: DC

## 2022-02-19 MED ORDER — BENZONATATE 200 MG PO CAPS: 200.0000 mg | ORAL_CAPSULE | Freq: Three times a day (TID) | ORAL | Status: DC | PRN

## 2022-02-19 NOTE — Patient Instructions (Addendum)
Prednisone 10 mg take  4 each am x 2 days,   2 each am x 2 days,  1 each am x 2 days and stop   For cough > tessalon 200 mg  up to every 4 - 6 hours  Plan A = Automatic = Always=   Symbicort 160 dropping down 80 2puffs every 12 hours  Work on inhaler technique:  relax and gently blow all the way out then take a nice smooth full deep breath back in, triggering the inhaler at same time you start breathing in.  Hold breath in for at least  5 seconds if you can. Blow out symbicort  thru nose. Rinse and gargle with water when done.  If mouth or throat bother you at all,  try brushing teeth/gums/tongue with arm and hammer toothpaste/ make a slurry and gargle and spit out.     Plan B = Backup (to supplement plan A, not to replace it) Only use your albuterol inhaler as a rescue medication to be used if you can't catch your breath by resting or doing a relaxed purse lip breathing pattern.  - The less you use it, the better it will work when you need it. - Ok to use the inhaler up to 2 puffs  every 4 hours if you must but call for appointment if use goes up over your usual need - Don't leave home without it !!  (think of it like the spare tire for your car)      GERD (REFLUX)  is an extremely common cause of respiratory symptoms just like yours , many times with no obvious heartburn at all.    It can be treated with medication, but also with lifestyle changes including elevation of the head of your bed (ideally with 6 -8inch blocks under the headboard of your bed),  Smoking cessation, avoidance of late meals, excessive alcohol, and avoid fatty foods, chocolate, peppermint, colas, red wine, and acidic juices such as orange juice.  NO MINT OR MENTHOL PRODUCTS SO NO COUGH DROPS  USE SUGARLESS CANDY INSTEAD (Jolley ranchers or Stover's or Life Savers) or even ice chips will also do - the key is to swallow to prevent all throat clearing. NO OIL BASED VITAMINS - use powdered substitutes.  Avoid fish oil when  coughing.    Please schedule a follow up office visit in 6 weeks, call sooner if needed - bring inhalers

## 2022-02-19 NOTE — Progress Notes (Signed)
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   History of Present Illness  02/19/2022  Pulmonary/ 1st office eval/ Kathaleen Dudziak / [facility]  Chief Complaint  Patient presents with   Consult    SOB with sharp chest pain. States she clears her throat a lot and SOB has worsened over last few months   Dyspnea:  as an adult doe Cough: much worse since covid Jan 30 2022  whereas urge to clear throat and variable tightness in throat / gent ant cp were present x years prior  Sleep: does not wake her up  SABA use: avg once a day at lunch  Depomedrol shots helped   No obvious day to day or daytime pattern/variability or assoc excess/ purulent sputum or mucus plugs or hemoptysis or cp or chest tightness, subjective wheeze or overt sinus or hb symptoms.   Sleeping  without nocturnal  or early am exacerbation  of respiratory  c/o's or need for noct saba. Also denies any obvious fluctuation of symptoms with weather or environmental changes or other aggravating or alleviating factors except as outlined above   No unusual exposure hx or h/o childhood pna/ asthma or knowledge of premature birth.  Current Allergies, Complete Past Medical History, Past Surgical History, Family History, and Social History were reviewed in [employer] record.  ROS  The following are not active complaints unless bolded Hoarseness, sore throat, dysphagia, dental problems, itching, sneezing,  nasal congestion or discharge of excess mucus or purulent secretions, ear ache,    fever, chills, sweats, unintended wt loss or wt gain, classically pleuritic or exertional cp,  orthopnea pnd or arm/hand swelling  or leg swelling, presyncope, palpitations, abdominal pain, anorexia, nausea, vomiting, diarrhea  or change in bowel habits or change in bladder habits, change in stools or change in urine, dysuria, hematuria,  rash, arthralgias, visual complaints, headache, numbness, weakness or ataxia or problems with walking or coordination,  change in mood or  memory.           Past Medical History:  Diagnosis Date   Anxiety    Breast cancer ([place])    Depression    Diabetes mellitus without complication ([place])    Thyroid nodule     Outpatient Medications Prior to Visit  Medication Sig Dispense Refill   acarbose (PRECOSE) 25 MG tablet Take by mouth daily.     acetaminophen (TYLENOL) 500 MG tablet Take 500 mg by mouth every 6 (six) hours as needed.     albuterol (VENTOLIN HFA) 108 (90 Base) MCG/ACT inhaler      ASMANEX HFA 200 MCG/ACT AERO      clonazePAM (KLONOPIN) 0.5 MG tablet Take 1 tablet (0.5 mg total) by mouth 2 (two) times daily as needed. 30 tablet 0   diphenhydrAMINE (BENADRYL) 25 MG tablet Take 25 mg by mouth at bedtime as needed.     ferrous sulfate 325 (65 FE) MG tablet Take by mouth.     ibuprofen (  ADVIL) 200 MG tablet Take 800 mg by mouth daily. Current dose     Prenatal Vit-Fe Fumarate-FA (PRENATAL VITAMIN PO) Take by mouth.     Probiotic Product (PROBIOTIC DAILY PO) Take by mouth.     sennosides-docusate sodium (SENOKOT-S) 8.6-50 MG tablet Take 3 tablets by mouth at bedtime.     UNABLE TO FIND daily. Med Name: vitamin b 3     No facility-administered medications prior to visit.     Objective:     BP 128/68   Pulse 82   Temp 97.6 F (36.4 C)   Ht '5\' 5"'$  (1.651 m)   Wt 149 lb 12.8 oz (67.9 kg)   SpO2 98% Comment: ra  BMI 24.93 kg/m   SpO2: 98 % (ra)  Pleasant amb wf nad/ good voice texture   HEENT : Oropharynx  clear no cobblestoning/pnd      Nasal turbinates mod non-specific edema    NECK :  without  apparent JVD/ palpable Nodes/TM    LUNGS: no acc muscle use,  Nl contour chest which is clear to A and P bilaterally with  cough on  exp maneuvers   CV:  RRR  no s3 or murmur or increase in P2, and no edema   ABD:  soft and nontender with nl inspiratory excursion in the supine position. No bruits or organomegaly appreciated   MS:  Nl gait/ ext warm without deformities Or obvious joint restrictions  calf tenderness, cyanosis or clubbing    SKIN: warm and dry without lesions    NEURO:  alert, approp, nl sensorium with  no motor or cerebellar deficits apparent.       Assessment   Cough variant asthma vs UACS Seasonal rhinitis dating back to childhood then cough x 2019 with freq throat clearing  - 02/19/2022  After extensive coaching inhaler device,  effectiveness =    80% > try symbicort 160 2bid until use up theb 80 2bid and max gerd rx  - FEN0  02/19/2022  = 13 while on asthmanex 200 2bid   The most common causes of chronic cough in immunocompetent adults include the following: upper airway cough syndrome (UACS), previously referred to as postnasal drip syndrome (PNDS), which is caused by variety of rhinosinus conditions; (2) asthma; (3) GERD; (4) chronic bronchitis from cigarette smoking or other inhaled environmental irritants; (5) nonasthmatic eosinophilic bronchitis; and (6) bronchiectasis.   These conditions, singly or in combination, have accounted for up to 94% of the causes of chronic cough in prospective studies.   Other conditions have constituted no >6% of the causes in prospective studies These have included bronchogenic carcinoma, chronic interstitial pneumonia, sarcoidosis, left ventricular failure, ACEI-induced cough, and aspiration from a condition associated with pharyngeal dysfunction.    Chronic cough is often simultaneously caused by more than one condition. A single cause has been found from 38 to 82% of  the time, multiple causes from 18 to 62%. Multiple caused cough has been the result of three diseases up to 42% of the time.   I suspect this all startred with poorly controlled allergic rhinitis and evolved into cyclical cough with ? Asthma component  Of the three most common causes of  Sub-acute / recurrent or chronic cough, only one (GERD)  can actually contribute to/ trigger  the other two (asthma and post nasal drip syndrome)  and perpetuate the cylce of cough.  While not intuitively obvious, many patients with chronic low grade reflux do not cough until there is  a primary insult that disturbs the protective epithelial barrier and exposes sensitive nerve endings.   This is typically viral but can due to PNDS and  either both may apply here.     >>>The point is that once this occurs, it is difficult to eliminate the cycle  using anything but a maximally effective acid suppression regimen at least in the short run, accompanied by an appropriate diet to address non acid GERD and control / eliminate the cough itself for at least 3 days with tessalon up to 200 mg every 4-6 h and lots of hard rock candy to avoid urge to cough >>> also so added 6 day taper off  Prednisone starting at 40 mg per day in case of component of Th-2 driven upper or lower airways inflammation (if cough responds short term only to relapse before return while will on full rx for uacs (as above), then  that would point to allergic rhinitis/ asthma or eos bronchitis as alternative dx)           Each maintenance medication was reviewed in detail including emphasizing most importantly the difference between maintenance and prns and under what circumstances the prns are to be triggered using an action plan format where appropriate.  Total time for H and P, chart review, counseling, reviewing hfa  device(s) and generating customized AVS unique to this office visit / same day charting  > 45 min pt new to me                 Christinia Gully, MD 02/19/2022

## 2022-02-19 NOTE — Assessment & Plan Note (Addendum)
Seasonal rhinitis dating back to childhood then cough x 2019 with freq throat clearing  - 02/19/2022  After extensive coaching inhaler device,  effectiveness =    80% > try symbicort 160 2bid until use up theb 80 2bid and max gerd rx  - FEN0  02/19/2022  = 13 while on asthmanex 200 2bid   The most common causes of chronic cough in immunocompetent adults include the following: upper airway cough syndrome (UACS), previously referred to as postnasal drip syndrome (PNDS), which is caused by variety of rhinosinus conditions; (2) asthma; (3) GERD; (4) chronic bronchitis from cigarette smoking or other inhaled environmental irritants; (5) nonasthmatic eosinophilic bronchitis; and (6) bronchiectasis.   These conditions, singly or in combination, have accounted for up to 94% of the causes of chronic cough in prospective studies.   Other conditions have constituted no >6% of the causes in prospective studies These have included bronchogenic carcinoma, chronic interstitial pneumonia, sarcoidosis, left ventricular failure, ACEI-induced cough, and aspiration from a condition associated with pharyngeal dysfunction.    Chronic cough is often simultaneously caused by more than one condition. A single cause has been found from 38 to 82% of the time, multiple causes from 18 to 62%. Multiple caused cough has been the result of three diseases up to 42% of the time.   I suspect this all startred with poorly controlled allergic rhinitis and evolved into cyclical cough with ? Asthma component  Of the three most common causes of  Sub-acute / recurrent or chronic cough, only one (GERD)  can actually contribute to/ trigger  the other two (asthma and post nasal drip syndrome)  and perpetuate the cylce of cough.  While not intuitively obvious, many patients with chronic low grade reflux do not cough until there is a primary insult that disturbs the protective epithelial barrier and exposes sensitive nerve endings.   This is  typically viral but can due to PNDS and  either both may apply here.     >>>The point is that once this occurs, it is difficult to eliminate the cycle  using anything but a maximally effective acid suppression regimen at least in the short run, accompanied by an appropriate diet to address non acid GERD and control / eliminate the cough itself for at least 3 days with tessalon up to 200 mg every 4-6 h and lots of hard rock candy to avoid urge to cough >>> also so added 6 day taper off  Prednisone starting at 40 mg per day in case of component of Th-2 driven upper or lower airways inflammation (if cough responds short term only to relapse before return while will on full rx for uacs (as above), then  that would point to allergic rhinitis/ asthma or eos bronchitis as alternative dx)           Each maintenance medication was reviewed in detail including emphasizing most importantly the difference between maintenance and prns and under what circumstances the prns are to be triggered using an action plan format where appropriate.  Total time for H and P, chart review, counseling, reviewing hfa  device(s) and generating customized AVS unique to this office visit / same day charting  > 45 min pt new to me

## 2022-03-26 ENCOUNTER — Ambulatory Visit: Payer: Managed Care, Other (non HMO) | Admitting: Rehabilitation

## 2022-04-02 ENCOUNTER — Telehealth: Payer: Self-pay | Admitting: Internal Medicine

## 2022-04-02 ENCOUNTER — Ambulatory Visit: Payer: Managed Care, Other (non HMO) | Admitting: Internal Medicine

## 2022-04-02 ENCOUNTER — Encounter: Payer: Self-pay | Admitting: Internal Medicine

## 2022-04-02 VITALS — BP 132/78 | HR 82 | Temp 98.0°F | Ht 65.0 in | Wt 153.0 lb

## 2022-04-02 DIAGNOSIS — R079 Chest pain, unspecified: Secondary | ICD-10-CM

## 2022-04-02 DIAGNOSIS — J45991 Cough variant asthma: Secondary | ICD-10-CM | POA: Diagnosis not present

## 2022-04-02 MED ORDER — PANTOPRAZOLE SODIUM 40 MG PO TBEC: 40.0000 mg | DELAYED_RELEASE_TABLET | Freq: Every day | ORAL | 2 refills | Status: DC

## 2022-04-02 MED ORDER — BUDESONIDE-FORMOTEROL FUMARATE 80-4.5 MCG/ACT IN AERO: INHALATION_SPRAY | RESPIRATORY_TRACT | 12 refills | Status: DC

## 2022-04-02 MED ORDER — BENZONATATE 200 MG PO CAPS: 200.0000 mg | ORAL_CAPSULE | Freq: Three times a day (TID) | ORAL | 3 refills | Status: DC | PRN

## 2022-04-02 NOTE — Patient Instructions (Addendum)
Pantoprazole (protonix) 40 mg   Take  30-60 min before first meal of the day and Pepcid (famotidine)  20 mg after supper until return to office - this is the best way to tell whether stomach acid is contributing to your problem.    For cough > tessalon 200 up to 3-4 x per day  GERD (REFLUX)  is an extremely common cause of respiratory symptoms just like yours , many times with no obvious heartburn at all.    It can be treated with medication, but also with lifestyle changes including elevation of the head of your bed (ideally with 6 -8inch blocks under the headboard of your bed),  Smoking cessation, avoidance of late meals, excessive alcohol, and avoid fatty foods, chocolate, peppermint, colas, red wine, and acidic juices such as orange juice.  NO MINT OR MENTHOL PRODUCTS SO NO COUGH DROPS (ludens ok)  USE SUGARLESS CANDY INSTEAD (Jolley ranchers or Stover's or Astronomer) or even ice chips will also do - the key is to swallow to prevent all throat clearing. NO OIL BASED VITAMINS - use powdered substitutes.  Avoid fish oil when coughing.   Reduce symbicort 80 Take 2 puffs first thing in am and then another 2 puffs about 12 hours later.     Only use your albuterol as a rescue medication to be used if you can't catch your breath by resting or doing a relaxed purse lip breathing pattern.  - The less you use it, the better it will work when you need it. - Ok to use up to 2 puffs  every 4 hours if you must but call for immediate appointment if use goes up over your usual need - Don't leave home without it !!  (think of it like the spare tire for your car)   Also  Ok to try albuterol 15 min before an activity (on alternating days)  that you know would usually make you short of breath and see if it makes any difference and if makes none then don't take albuterol after activity unless you can't catch your breath as this means it's the resting that helps, not the albuterol.        Please schedule a  follow up office visit in 6 weeks, call sooner if needed

## 2022-04-02 NOTE — Telephone Encounter (Signed)
Called and notified patient and she is agreeable to CXR and will call if she worsens over the next 6 weeks before appt.

## 2022-04-02 NOTE — Progress Notes (Unsigned)
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   History of Present Illness  02/19/2022  Pulmonary/ 1st office eval/ Quenisha Lovins / [facility]  Chief Complaint  Patient presents with   Consult    SOB with sharp chest pain. States she clears her throat a lot and SOB has worsened over last few months   Dyspnea:  as an adult doe Cough: much worse since covid Jan 30 2022  whereas urge to clear throat and variable tightness in throat / gen ant cp were present x years prior  Sleep: does not wake her up  SABA use: avg once a day at lunch  Depomedrol shots helped  Rec Prednisone 10 mg take  4 each am x 2 days,   2 each am x 2 days,  1 each am x 2 days and stop  For cough > tessalon 200 mg  up to every 4 - 6 hours Plan A = Automatic = Always=   Symbicort 160 dropping down 80 2puffs every 12 hours Work on inhaler technique:   Plan B = Backup (to supplement plan A, not to replace it) Only use your albuterol inhaler as a rescue medication GERD diet reviewed, bed blocks rec   Please schedule a follow up office visit in 6 weeks, call sooner if needed - bring inhalers    04/02/2022  f/u ov/[place] office/Brihany Butch re: cough/chest tight  maint on ***  Chief Complaint  Patient presents with   Follow-up    Still having cough and chest tightness/ pain   Dyspnea:  for years does run/walk  - 30 min did fine unless  Cough: dry/ throat clearing  Sleeping: ok flat / on SABA use: once a week  02: none  Covid status: vax x 3      No obvious day to day or  daytime variability or assoc excess/ purulent sputum or mucus plugs or hemoptysis or cp or chest tightness, subjective wheeze or overt sinus or hb symptoms.   *** without nocturnal  or early am exacerbation  of respiratory  c/o's or need for noct saba. Also denies any obvious fluctuation of symptoms with weather or environmental changes or other aggravating or alleviating factors except as outlined above   No unusual exposure hx or h/o childhood pna/ asthma or knowledge of premature birth.  Current Allergies, Complete Past Medical History, Past Surgical History, Family History, and Social History were reviewed in [employer] record.  ROS  The following are not active complaints unless bolded Hoarseness, sore throat, dysphagia, dental problems, itching, sneezing,  nasal congestion or discharge of excess mucus or purulent secretions, ear ache,   fever, chills, sweats, unintended wt loss or wt gain, classically pleuritic or exertional cp,  orthopnea pnd or arm/hand swelling  or leg swelling, presyncope, palpitations, abdominal pain, anorexia, nausea, vomiting, diarrhea  or change in bowel habits or change in bladder habits, change in stools or change in urine, dysuria, hematuria,  rash, arthralgias, visual complaints, headache, numbness, weakness or ataxia or problems with walking or coordination,  change in mood or  memory.        Current Meds  Medication Sig   acetaminophen (TYLENOL) 500 MG tablet Take 500 mg by mouth every 6 (six) hours as needed.   albuterol (VENTOLIN HFA) 108 (90 Base) MCG/ACT inhaler    benzonatate (TESSALON) 200 MG capsule Take 1 capsule (200 mg total) by mouth 3 (three) times daily as needed for cough.   budesonide-formoterol (SYMBICORT) 80-4.5 MCG/ACT inhaler Take 2 puffs first thing in am and then another 2 puffs about 12 hours later.   clonazePAM (KLONOPIN) 0.5 MG tablet Take 1 tablet (0.5 mg total) by mouth 2 (two) times daily as needed.    diphenhydrAMINE (BENADRYL) 25 MG tablet Take 25 mg by mouth at bedtime as needed.   famotidine (PEPCID) 20 MG tablet One after supper   ferrous sulfate 325 (65 FE) MG tablet Take by mouth.   ibuprofen (ADVIL) 200 MG tablet Take 800 mg by mouth daily. Current dose   pantoprazole (PROTONIX) 40 MG tablet Take 1 tablet (40 mg total) by mouth daily. Take 30-60 min before first meal of the day   sennosides-docusate sodium (SENOKOT-S) 8.6-50 MG tablet Take 3 tablets by mouth at bedtime.   UNABLE TO FIND daily. Med Name: vitamin b 3   UNABLE TO FIND Med Name: alka seltzer heartburn relief prn                Past Medical History:  Diagnosis Date   Anxiety    Breast cancer ([place])    Depression    Diabetes mellitus without complication ([place])    Thyroid nodule        Objective:     Wt Readings from Last 3 Encounters:  04/02/22 153 lb (69.4 kg)  02/19/22 149 lb 12.8 oz (67.9 kg)  03/24/21 148 lb 6 oz (67.3 kg)      Vital signs reviewed  04/02/2022  - Note at rest 02 sats  ***% on ***   General appearance:    ***              Assessment

## 2022-04-02 NOTE — Telephone Encounter (Signed)
Refill sent.   At end of appt, patient stated she did not get to discuss all of her concerns today with Dr. Melvyn Novas.  She states she has been having continued chest tightness/ pain that takes her breath away sometimes. She has been having this pain everyday. She wanted to ask Dr. Melvyn Novas if she needed to see cardiology or have a chest x ray for chest pain. And states this is her main concern right now over all of her other issues. Would like to know what she should do for this/ what is recommended and if Dr. Melvyn Novas thinks this could be a pulmonary issues or an issue for cardiology. Patient is very concerned about the chest pain.  Dr. Melvyn Novas please advise.

## 2022-04-02 NOTE — Telephone Encounter (Signed)
I am hoping that by stopping the cough the discomfort should improve as it has the pattern of musculoskelatal pain from excess cough but fine to go ahead and order a cxr and happy to refer to cards but it does not have that pattern (worse with exertion, resolves at rest). If not better w/in the next few weeks on present rx please let me know and I'll see her back and work on just this one problem

## 2022-04-03 ENCOUNTER — Encounter: Payer: Self-pay | Admitting: Internal Medicine

## 2022-04-03 NOTE — Assessment & Plan Note (Signed)
Seasonal rhinitis dating back to childhood then cough x 2019 with freq throat clearing  - 02/19/2022  After extensive coaching inhaler device,  effectiveness =    80% > try symbicort 160 2bid until use up then 80 2bid and max gerd rx > did not do the latter 2  recs and continued to cp/throat irritation 04/02/2022  - FEN0  02/19/2022  = 13 while on asthmanex 200 2bid  - 04/02/2022  After extensive coaching inhaler device,  effectiveness =    90% so try symbicort 80 2bid, off all mints and suppress cough with tessalon 200  tid   Features are pointing more to UACS than asthma at this point but still has sensation of chest tightness and doe (not directly connected) that need to be sorted out once the cyclical cough is eliminated (explained again how cough promotes reflux and vice versa.    If not improving will need PFT/ methacholine challenge to sort out the various components which do not at all suggest a cardiac component at this point.          Each maintenance medication was reviewed in detail including emphasizing most importantly the difference between maintenance and prns and under what circumstances the prns are to be triggered using an action plan format where appropriate.  Total time for H and P, chart review, counseling, reviewing hfa device(s) and generating customized AVS unique to this office visit / same day charting  > 30 min for multiple  refractory respiratory  symptoms of uncertain etiology

## 2022-04-05 ENCOUNTER — Telehealth: Payer: Self-pay | Admitting: Nurse Practitioner

## 2022-04-05 NOTE — Telephone Encounter (Signed)
Per 12/11 IB, called and left message with pt

## 2022-04-06 ENCOUNTER — Telehealth: Payer: Self-pay | Admitting: Internal Medicine

## 2022-04-06 NOTE — Telephone Encounter (Signed)
Called and left detailed message that we are going to wait to regroup at office visit with Dr Melvyn Novas about labs. Nothing further needed

## 2022-04-06 NOTE — Telephone Encounter (Signed)
Pt states she was suppose to have labs drawn 10/27 but lab was closed, when she was in 12/8 she forgot about it. Patient would like to get labs, but there is no order. Please advise on what labs- pt wants to know if she can eat prior. If she does not answer- okay to leave voicemail, would like nurse to leave lab times on the voicemail too (11:45-1pm in [place]).

## 2022-04-06 NOTE — Telephone Encounter (Signed)
We can wait and regroup at ov so she doesn't run the risk of getting stuck twice

## 2022-04-06 NOTE — Telephone Encounter (Signed)
Sir patient was supposed to get labs in October but did not because lab was closed. Can you please advise on what labs if any you would like patient to get.   Patient has follow up with you January 19th. Do you want labs before that appointment or wait until office visit?  Please advise sir

## 2022-04-09 ENCOUNTER — Ambulatory Visit (HOSPITAL_COMMUNITY)
Admission: RE | Admit: 2022-04-09 | Discharge: 2022-04-09 | Disposition: A | Discharge status: Home / Self Care | Payer: Managed Care, Other (non HMO) | Source: Ambulatory Visit | Attending: Internal Medicine | Admitting: Internal Medicine

## 2022-04-09 DIAGNOSIS — R079 Chest pain, unspecified: Secondary | ICD-10-CM | POA: Insufficient documentation

## 2022-04-28 ENCOUNTER — Inpatient Hospital Stay: Payer: Managed Care, Other (non HMO) | Admitting: Nurse Practitioner

## 2022-04-30 ENCOUNTER — Ambulatory Visit: Payer: Managed Care, Other (non HMO) | Admitting: Rehabilitation

## 2022-05-07 ENCOUNTER — Ambulatory Visit: Payer: Managed Care, Other (non HMO) | Attending: General Surgery | Admitting: Rehabilitation

## 2022-05-07 DIAGNOSIS — Z483 Aftercare following surgery for neoplasm: Secondary | ICD-10-CM | POA: Insufficient documentation

## 2022-05-07 LAB — TSH: TSH: 1.47 (ref 0.41–5.90)

## 2022-05-07 LAB — LIPID PANEL
Cholesterol: 193 (ref 0–200)
HDL: 72 — AB (ref 35–70)
LDL Cholesterol: 110
Triglycerides: 58 (ref 40–160)

## 2022-05-07 LAB — HEMOGLOBIN A1C: Hemoglobin A1C: 5.9

## 2022-05-07 NOTE — Therapy (Signed)
  OUTPATIENT PHYSICAL THERAPY SOZO SCREENING NOTE   Patient Name: Natalie Cardenas MRN: [id number] DOB:April 28, 1975, 47 y.o., female Today's Date: 05/07/2022  PCP: Jake Samples, PA-C REFERRING PROVIDER: Rolm Bookbinder, MD   PT End of Session - 05/07/22 1110     Visit Number 22   screen only   PT Start Time 1103    PT Stop Time 1109    PT Time Calculation (min) 6 min    Activity Tolerance Patient tolerated treatment well    Behavior During Therapy WFL for tasks assessed/performed              Past Medical History:  Diagnosis Date   Anxiety    Breast cancer ([place])    Depression    Diabetes mellitus without complication ([place])    Thyroid nodule    Past Surgical History:  Procedure Laterality Date   BREAST RECONSTRUCTION WITH PLACEMENT OF TISSUE EXPANDER AND FLEX HD (ACELLULAR HYDRATED DERMIS) Bilateral 04/29/2020   Procedure: IMMEDIATE BILATERAL BREAST RECONSTRUCTION WITH PLACEMENT OF TISSUE EXPANDER AND FLEX HD (ACELLULAR HYDRATED DERMIS);  Surgeon: [name], MD;  Location: [facility];  Service: Plastics;  Laterality: Bilateral;   NIPPLE SPARING MASTECTOMY WITH SENTINEL LYMPH NODE BIOPSY Bilateral 04/29/2020   Procedure: BILATERAL NIPPLE SPARING MASTECTOMIES WITH RIGHT AXILLARY SENTINEL LYMPH NODE BIOPSY;  Surgeon: Rolm Bookbinder, MD;  Location: [facility];  Service: General;  Laterality: Bilateral;  BILATERAL PEC BLOCK, RNFA   REMOVAL OF BILATERAL TISSUE EXPANDERS WITH PLACEMENT OF BILATERAL BREAST IMPLANTS Bilateral 06/23/2020   Procedure: REMOVAL OF BILATERAL TISSUE EXPANDERS WITH PLACEMENT OF BILATERAL BREAST IMPLANTS;  Surgeon: [name], MD;  Location: [facility];  Service: Plastics;  Laterality: Bilateral;  1.5 hours   TONSILLECTOMY     Patient Active Problem List   Diagnosis Date Noted   Cough variant asthma vs UACS 02/19/2022   Cervical radiculopathy at C6 03/12/2021   Breast cancer ([place]) 04/29/2020    Genetic testing 01/14/2020   Ductal carcinoma in situ (DCIS) of right breast 01/02/2020    REFERRING DIAG: right breast cancer at risk for lymphedema  THERAPY DIAG:  Aftercare following surgery for neoplasm  PERTINENT HISTORY: bilateral mastectomy in Apr 29 2020 with removal 2 nodes on the right with immediate exanders. Expanders were removed and implants placed Feb 28. Pt did not have chemo or radiation.   PRECAUTIONS: right UE Lymphedema risk  SUBJECTIVE: nothing new   PAIN:  Are you having pain? No  SOZO SCREENING: Patient was assessed today using the SOZO machine to determine the lymphedema index score. This was compared to her baseline score. It was determined that she is within the recommended range when compared to her baseline and no further action is needed at this time. She will continue SOZO screenings. These are done every 3 months for 2 years post operatively followed by every 6 months for 2 years, and then annually.  PLAN: Continue sozo 35month out  TStark Bray PT 05/07/2022, 11:10 AM

## 2022-05-08 LAB — LAB REPORT - SCANNED: A1c: 5.9

## 2022-05-11 ENCOUNTER — Telehealth: Payer: Self-pay | Admitting: Nurse Practitioner

## 2022-05-11 MED ORDER — DULOXETINE HCL 20 MG PO CPEP: 20.0000 mg | ORAL_CAPSULE | Freq: Every day | ORAL | 1 refills | Status: DC

## 2022-05-11 NOTE — Telephone Encounter (Signed)
I called Natalie Cardenas back per her request to discuss several issues/concerns.   Overall she does not feel healthy. She has developed occasional flushing/hot flash, weight gain, difficulty concentrating and complete tasks. She is not anxious or depressed at baseline but she is getting frustrated with feeling this way and "it is depressing."   She has felt shaky lately, BG 230 - 160 when she checked at work. She reportedly had insulin issues during pregnancy and was on acarbose until DCIS diagnosis in 12/2019. She saw PCP recently, A1c below 6 and TSH normal. She has been referred to endo which I agree. If some/all her symptoms are felt to be menopausal, I cautioned her to avoid estrogen due to her h/o ER+ DCIS. She understands.   Separately, she has residual R chest wall/axillary pain since mastectomy which still fluctuates but overall better. She has new left chest pain under ribcage which is a dull ache but can be sharpe at times. No provoking events, occurs at rest and exertion. She saw pulm Dr. Melvyn Novas, had a cough at the time, and was prescribed respiratory meds which have improved the cough but not the pain. She has pulm f/up 1/19 and was referred to cardiology, she will see Dr. Percival Spanish 1/26.   I encouraged her to keep these appointments. If no pulm or cardiac etiology, she may have some component of neuropathic pain from b/l mastectomy and reconstruction. She is intolerant to gabapentin, lyrica, and effexor. I offered cymbalta and she is interested. I will prescribe, she can start after she sees pulm and cardiology.   She will call me after work up to let me know how she's doing. I plan to see her back in the office in few months for breast exam for h/o DCIS. She agrees with the plan and appreciates the call.   Natalie Rue, NP

## 2022-05-14 ENCOUNTER — Ambulatory Visit: Payer: Managed Care, Other (non HMO) | Admitting: Internal Medicine

## 2022-05-14 ENCOUNTER — Encounter: Payer: Self-pay | Admitting: Internal Medicine

## 2022-05-14 VITALS — BP 112/74 | HR 88 | Ht 65.0 in | Wt 152.4 lb

## 2022-05-14 DIAGNOSIS — R0609 Other forms of dyspnea: Secondary | ICD-10-CM | POA: Insufficient documentation

## 2022-05-14 DIAGNOSIS — J45991 Cough variant asthma: Secondary | ICD-10-CM

## 2022-05-14 LAB — POCT EXHALED NITRIC OXIDE: FeNO level (ppb): 10

## 2022-05-14 NOTE — Assessment & Plan Note (Addendum)
Seasonal rhinitis dating back to childhood then cough x 2019 with freq throat clearing  - 02/19/2022    try symbicort 160 2bid until use up then 80 2bid and max gerd rx > did not do the latter 2  recs and continued to cp/throat irritation 04/02/2022  - FEN0  02/19/2022  = 15 while on asthmanex 200 2bid  - 04/02/2022   so try symbicort 80 2bid, off all mints and suppress cough with tessalon  - FENO 05/14/2022  = 10 on symb 80 2bid  - 05/14/2022  After extensive coaching inhaler device,  effectiveness =    90%  - 05/14/2022   Walked on RA  x  3  lap(s) =  approx 450  ft  @ brisk pace, stopped due to end of study  with lowest 02 sats 93% and a bit dizzy but no sob    Suspect the chest tightness which is chronic is likely related to extensive chest wall surgery with no evidence of asthma and chronicity makes it unlikely this is PE though has been at risk with all the surgery she's had and I would go ahead and do labs today and await cards eval > cpst next step if cards w/u unrevealing with spirometry before and after.  Each maintenance medication was reviewed in detail including emphasizing most importantly the difference between maintenance and prns and under what circumstances the prns are to be triggered using an action plan format where appropriate.  Total time for H and P, chart review, counseling, reviewing hfa  device(s) , directly observing portions of ambulatory 02 saturation study/ and generating customized AVS unique to this office visit / same day charting = > 30 min for multiple  refractory respiratory  symptoms of uncertain etiology

## 2022-05-14 NOTE — Progress Notes (Signed)
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  S/p bilateral mastectomy for DCIS /reconstruction 2021- 2022 with neuropathic pain post op  History of Present Illness  02/19/2022  Pulmonary/ 1st office eval/ Natalie Cardenas / [facility]  Chief Complaint  Patient presents with   Consult    SOB with sharp chest pain. States she clears her throat a lot and SOB has worsened over last few months   Dyspnea:  as an adult always has had doe compared to peers Cough: much worse since covid Jan 30 2022  whereas urge to clear throat and variable tightness in throat / gen ant cp were present x years prior  Sleep: does not wake her up  SABA use: avg once a day at lunch  Depomedrol shots helped  Rec Prednisone 10 mg take  4 each am x 2 days,   2 each am x 2 days,  1 each am x 2 days and stop  For cough > tessalon 200 mg  up to every 4 - 6 hours Plan A = Automatic = Always=   Symbicort 160 dropping down 80 2puffs every 12 hours Work on inhaler technique:   Plan B = Backup (to supplement plan A, not to replace it) Only use your albuterol inhaler as a rescue medication GERD diet reviewed, bed blocks rec   Please schedule a follow up office visit in 6 weeks, call sooner if needed - bring inhalers    04/02/2022  f/u ov/[place] office/Natalie Cardenas re: cough/chest tight  maint on symb 160 which "burns throat" and lots of mint lozenges/ alkaseltzer for gerd   Chief Complaint  Patient presents with   Follow-up    Still having cough and chest tightness/ pain   Dyspnea:   for years does run/walk  - 30 min did fine unless more walk than run does not provoke chest tightness or cough  Cough: dry/ throat clearing / sporadic/ has tessalon which helps but not using  Sleeping: ok flat SABA use: once a week  02: none  Covid status: vax x 3  Rec Pantoprazole (protonix) 40 mg   Take  30-60 min before first meal of the day and Pepcid (famotidine)  20 mg after supper until return to office   For cough > tessalon 200 up to 3-4 x per day GERD diet reviewed, bed blocks rec  Reduce symbicort 80 Take 2 puffs first thing in am and then another 2 puffs about 12 hours later.  Only use your albuterol as a rescue medication  Also  Ok to try albuterol 15 min before an activity (on alternating days)  that you know would usually make you short of breath     05/14/2022  f/u ov/[place] office/Natalie Cardenas re: cough variant asthma maint on symb 80 2bid   Chief Complaint  Patient presents with   Follow-up    Pt f/u she is still concerned about lingering chest pain/tightness also SOB. Reports having some tremors but has  appt to establish care w/ endocrinologist  Dyspnea:  not able able to do regular walk/run and no longer doing steps s sob x ever since chest surgery with assoc  chest tightness is described as constant daytimes with occ  shooting pains that are transient and neither affected by deep breath or cough or exertion. Cough: better to her satisfaction Sleeping: does fine once supine and asleep s chest tight ness  SABA use: very little 02: none  Covid status: vax x 3    No obvious day to day or daytime variability or assoc excess/ purulent sputum or mucus plugs or hemoptysis or  subjective wheeze or overt sinus or hb symptoms.   Sleeping  without nocturnal  or early am exacerbation  of respiratory  c/o's or need for noct saba. Also denies any obvious fluctuation of symptoms with weather or environmental changes or other aggravating or alleviating factors except as outlined above    No unusual exposure hx or h/o childhood pna/ asthma or knowledge of premature birth.  Current Allergies, Complete Past Medical History, Past Surgical History, Family History, and Social History were reviewed in [employer] record.  ROS  The following are not active complaints unless bolded Hoarseness, sore throat, dysphagia, dental problems, itching, sneezing,  nasal congestion or discharge of excess mucus or purulent secretions, ear ache,   fever, chills, sweats, unintended wt loss or wt gain, classically pleuritic or exertional cp,  orthopnea pnd or arm/hand swelling  or leg swelling, presyncope, palpitations, abdominal pain, anorexia, nausea, vomiting, diarrhea  or change in bowel habits or change in bladder habits, change in stools or change in urine, dysuria, hematuria,  rash, arthralgias, visual complaints, headache, numbness, weakness or ataxia or problems with walking or coordination,  change in mood or  memory. tremors        Current Meds  Medication Sig   acetaminophen (TYLENOL) 500 MG tablet Take 500 mg by mouth every 6 (six) hours as needed.   albuterol (VENTOLIN HFA) 108 (90 Base) MCG/ACT inhaler    benzonatate (TESSALON) 200 MG capsule Take 1 capsule (200 mg total) by mouth 3 (three) times daily as needed for cough.   budesonide-formoterol (SYMBICORT) 80-4.5 MCG/ACT inhaler Take 2 puffs first thing in am and then another 2 puffs about 12 hours later.   clonazePAM (KLONOPIN) 0.5 MG tablet Take 1 tablet (0.5 mg total) by mouth 2 (two) times daily as needed.   diphenhydrAMINE (BENADRYL) 25 MG tablet Take 25 mg by mouth at bedtime as needed.   DULoxetine (CYMBALTA) 20 MG capsule Take 1 capsule (20 mg total) by mouth daily.   famotidine (PEPCID) 20 MG tablet One after supper   ferrous sulfate 325 (65 FE) MG tablet Take by mouth.   ibuprofen (ADVIL) 200 MG tablet Take 800 mg by mouth daily. Current dose   pantoprazole (PROTONIX) 40 MG tablet Take 1 tablet (40 mg  total) by mouth daily. Take 30-60 min before first meal of the day   sennosides-docusate sodium (SENOKOT-S) 8.6-50 MG tablet Take 3 tablets by mouth at bedtime.   UNABLE TO FIND daily. Med Name: vitamin b 3   UNABLE TO FIND Med Name: alka seltzer heartburn relief prn                     Past Medical History:  Diagnosis Date   Anxiety    Breast cancer ([place])    Depression    Diabetes mellitus without complication ([place])  Thyroid nodule        Objective:     05/14/2022       152   04/02/22 153 lb (69.4 kg)  02/19/22 149 lb 12.8 oz (67.9 kg)  03/24/21 148 lb 6 oz (67.3 kg)    Vital signs reviewed  05/14/2022  - Note at rest 02 sats  98% on RA   General appearance:    amb pleasant wf / occ throat clearing   HEENT : Oropharynx  clear          NECK :  without  apparent JVD/ palpable Nodes/TM    LUNGS: no acc muscle use,  Nl contour chest which is clear to A and P bilaterally without cough on insp or exp maneuvers   CV:  RRR  no s3 or murmur or increase in P2, and no edema   ABD:  soft and nontender with nl inspiratory excursion in the supine position. No bruits or organomegaly appreciated   MS:  Nl gait/ ext warm without deformities Or obvious joint restrictions  calf tenderness, cyanosis or clubbing    SKIN: warm and dry without lesions    NEURO:  alert, approp, nl sensorium with  no motor or cerebellar deficits apparent.       I personally reviewed images and agree with radiology impression as follows:  CXR:   04/09/22  pa and lateral  No acute abnormalities       Assessment

## 2022-05-14 NOTE — Patient Instructions (Addendum)
CPST is the next step - we need you to you use  Symbicort prior to study - let the cardiologist complete their eval 1st but if still not improving your exercise tolerance this is the next study you need from me (done at the cardiology building on [address])    pattern suggests IBS  very limited distribution of pain locations, daytime, not usually exacerbated by exercise  or coughing, worse in sitting position, frequently associated with generalized abd bloating, not as likely to be present supine due to the dome effect of the diaphragm which  is  canceled in that position. Frequently these patients have had multiple negative GI workups and CT scans.   Treatment consists of avoiding foods that cause gas (especially boiled eggs, mexcican food but especially  beans and undercooked vegetables like  spinach and some salads)  and citrucel 1 heaping tsp twice daily with a large glass of water.  Pain should improve w/in 2 weeks and if not then consider further GI work up.

## 2022-05-15 LAB — BASIC METABOLIC PANEL
BUN/Creatinine Ratio: 22 (ref 9–23)
BUN: 17 mg/dL (ref 6–24)
CO2: 21 mmol/L (ref 20–29)
Calcium: 9.4 mg/dL (ref 8.7–10.2)
Chloride: 106 mmol/L (ref 96–106)
Creatinine, Ser: 0.76 mg/dL (ref 0.57–1.00)
Glucose: 119 mg/dL — ABNORMAL HIGH (ref 70–99)
Potassium: 3.9 mmol/L (ref 3.5–5.2)
Sodium: 143 mmol/L (ref 134–144)
eGFR: 97 mL/min/{1.73_m2} (ref >59)

## 2022-05-15 LAB — CBC WITH DIFFERENTIAL/PLATELET
Basophils Absolute: 0.1 10*3/uL (ref 0.0–0.2)
Basos: 1 %
EOS (ABSOLUTE): 0.5 10*3/uL — ABNORMAL HIGH (ref 0.0–0.4)
Eos: 8 %
Hematocrit: 34.7 % (ref 34.0–46.6)
Hemoglobin: 11.5 g/dL (ref 11.1–15.9)
Immature Grans (Abs): 0 10*3/uL (ref 0.0–0.1)
Immature Granulocytes: 0 %
Lymphocytes Absolute: 2.2 10*3/uL (ref 0.7–3.1)
Lymphs: 33 %
MCH: 31 pg (ref 26.6–33.0)
MCHC: 33.1 g/dL (ref 31.5–35.7)
MCV: 94 fL (ref 79–97)
Monocytes Absolute: 0.4 10*3/uL (ref 0.1–0.9)
Monocytes: 6 %
Neutrophils Absolute: 3.4 10*3/uL (ref 1.4–7.0)
Neutrophils: 52 %
Platelets: 221 10*3/uL (ref 150–450)
RBC: 3.71 x10E6/uL — ABNORMAL LOW (ref 3.77–5.28)
RDW: 12.3 % (ref 11.7–15.4)
WBC: 6.5 10*3/uL (ref 3.4–10.8)

## 2022-05-15 LAB — D-DIMER, QUANTITATIVE: D-DIMER: <0.2 mg/L FEU (ref 0.00–0.49)

## 2022-05-15 LAB — SEDIMENTATION RATE: Sed Rate: 4 mm/hr (ref 0–32)

## 2022-05-15 LAB — BRAIN NATRIURETIC PEPTIDE: BNP: 7.8 pg/mL (ref 0.0–100.0)

## 2022-05-20 DIAGNOSIS — R072 Precordial pain: Secondary | ICD-10-CM | POA: Insufficient documentation

## 2022-05-20 DIAGNOSIS — E118 Type 2 diabetes mellitus with unspecified complications: Secondary | ICD-10-CM | POA: Insufficient documentation

## 2022-05-20 NOTE — Progress Notes (Signed)
Cardiology Office Note   Date:  05/21/2022   ID:  Natalie Cardenas, DOB [birth year], MRN [id number]  PCP:  Jake Samples, PA-C  Cardiologist:   Minus Breeding, MD Referring:  Jake Samples, PA-C  Chief Complaint  Patient presents with   Chest Pain   Shortness of Breath      History of Present Illness: Natalie Cardenas is a 47 y.o. female who presents for evaluation of chest pain.  She is referred by Jake Samples, PA-C   .  She had chest pain a that started after bilateral mastectomy couple of years ago.  It was under her right axilla.  It was intense but after this started to resolve over time she started to notice that she still has left chest discomfort.  She says that this is a constant discomfort that waxes and wanes.  It is a dull ache.  Occasionally there is a sharp brief or discomfort.  She feels like she does not have the exercise tolerance she should have.  She feels like when she climbs up stairs she gets more short of breath and dizzy here.  She has seen a pulmonologist.  I do not see a clear diagnosis.  She did have a BNP that was normal and a chest x-ray that was normal.  She has not otherwise had any prior cardiac testing or workup.  He is not describing associated nausea vomiting or diaphoresis.  She is not describing PND or orthopnea.  She has had no weight gain or edema.   Past Medical History:  Diagnosis Date   Anxiety    Breast cancer ([place])    Depression    Diabetes mellitus without complication ([place])    Thyroid nodule     Past Surgical History:  Procedure Laterality Date   BREAST RECONSTRUCTION WITH PLACEMENT OF TISSUE EXPANDER AND FLEX HD (ACELLULAR HYDRATED DERMIS) Bilateral 04/29/2020   Procedure: IMMEDIATE BILATERAL BREAST RECONSTRUCTION WITH PLACEMENT OF TISSUE EXPANDER AND FLEX HD (ACELLULAR HYDRATED DERMIS);  Surgeon: [name], MD;  Location: [place];  Service: Plastics;  Laterality: Bilateral;   NIPPLE SPARING MASTECTOMY  WITH SENTINEL LYMPH NODE BIOPSY Bilateral 04/29/2020   Procedure: BILATERAL NIPPLE SPARING MASTECTOMIES WITH RIGHT AXILLARY SENTINEL LYMPH NODE BIOPSY;  Surgeon: Rolm Bookbinder, MD;  Location: [place];  Service: General;  Laterality: Bilateral;  BILATERAL PEC BLOCK, RNFA   REMOVAL OF BILATERAL TISSUE EXPANDERS WITH PLACEMENT OF BILATERAL BREAST IMPLANTS Bilateral 06/23/2020   Procedure: REMOVAL OF BILATERAL TISSUE EXPANDERS WITH PLACEMENT OF BILATERAL BREAST IMPLANTS;  Surgeon: [name], MD;  Location: [place];  Service: Plastics;  Laterality: Bilateral;  1.5 hours   TONSILLECTOMY       Current Outpatient Medications  Medication Sig Dispense Refill   acetaminophen (TYLENOL) 500 MG tablet Take 500 mg by mouth every 6 (six) hours as needed.     albuterol (VENTOLIN HFA) 108 (90 Base) MCG/ACT inhaler      benzonatate (TESSALON) 200 MG capsule Take 1 capsule (200 mg total) by mouth 3 (three) times daily as needed for cough. 40 capsule 3   budesonide-formoterol (SYMBICORT) 80-4.5 MCG/ACT inhaler Take 2 puffs first thing in am and then another 2 puffs about 12 hours later. 1 each 12   clonazePAM (KLONOPIN) 0.5 MG tablet Take 1 tablet (0.5 mg total) by mouth 2 (two) times daily as needed. 30 tablet 0   diphenhydrAMINE (BENADRYL) 25 MG tablet Take 25 mg by mouth  at bedtime as needed.     DULoxetine (CYMBALTA) 20 MG capsule Take 1 capsule (20 mg total) by mouth daily. 30 capsule 1   famotidine (PEPCID) 20 MG tablet One after supper 30 tablet 11   ferrous sulfate 325 (65 FE) MG tablet Take by mouth.     ibuprofen (ADVIL) 200 MG tablet Take 800 mg by mouth daily. Current dose     pantoprazole (PROTONIX) 40 MG tablet Take 1 tablet (40 mg total) by mouth daily. Take 30-60 min before first meal of the day 30 tablet 2   sennosides-docusate sodium (SENOKOT-S) 8.6-50 MG tablet Take 3 tablets by mouth at bedtime.     UNABLE TO FIND daily. Med Name: vitamin b 3      UNABLE TO FIND Med Name: alka seltzer heartburn relief prn     No current facility-administered medications for this visit.    Allergies:   Gabapentin, Lyrica cr [pregabalin er], and Other    Social History:  The patient  reports that she has never smoked. She has never used smokeless tobacco. She reports current alcohol use. She reports that she does not use drugs.   Family History:  The patient's family history includes Brain cancer in her paternal grandmother; Cancer in her paternal grandfather; Cancer (age of onset: 8) in her father; Heart failure in her mother.    ROS:  Please see the history of present illness.   Otherwise, review of systems are positive for none.   All other systems are reviewed and negative.    PHYSICAL EXAM: VS:  BP 108/78 (BP Location: Left Arm, Patient Position: Sitting, Cuff Size: Normal)   Pulse 77   Ht '5\' 5"'$  (1.651 m)   Wt 154 lb (69.9 kg)   BMI 25.63 kg/m  , BMI Body mass index is 25.63 kg/m. GENERAL:  Well appearing HEENT:  Pupils equal round and reactive, fundi not visualized, oral mucosa unremarkable NECK:  No jugular venous distention, waveform within normal limits, carotid upstroke brisk and symmetric, no bruits, no thyromegaly LYMPHATICS:  No cervical, inguinal adenopathy LUNGS:  Clear to auscultation bilaterally BACK:  No CVA tenderness CHEST:  Unremarkable HEART:  PMI not displaced or sustained,S1 and S2 within normal limits, no S3, no S4, no clicks, no rubs, no murmurs ABD:  Flat, positive bowel sounds normal in frequency in pitch, no bruits, no rebound, no guarding, no midline pulsatile mass, no hepatomegaly, no splenomegaly EXT:  2 plus pulses throughout, no edema, no cyanosis no clubbing SKIN:  No rashes no nodules NEURO:  Cranial nerves II through XII grossly intact, motor grossly intact throughout PSYCH:  Cognitively intact, oriented to person place and time    EKG:  EKG is ordered today. The ekg ordered today demonstrates sinus  rhythm, rate 77, axis within normal limits, intervals within normal limits, no acute ST-T wave changes.   Recent Labs: 05/14/2022: BNP 7.8; BUN 17; Creatinine, Ser 0.76; Hemoglobin 11.5; Platelets 221; Potassium 3.9; Sodium 143    Lipid Panel No results found for: "CHOL", "TRIG", "HDL", "CHOLHDL", "VLDL", "LDLCALC", "LDLDIRECT"    Wt Readings from Last 3 Encounters:  05/21/22 154 lb (69.9 kg)  05/14/22 152 lb 6.4 oz (69.1 kg)  04/02/22 153 lb (69.4 kg)      Other studies Reviewed: Additional studies/ records that were reviewed today include: Labs. Review of the above records demonstrates:  Please see elsewhere in the note.     ASSESSMENT AND PLAN:  Precordial chest pain:   Her symptoms  are nonanginal greater than anginal.  I am going to order a coronary calcium score. I will bring the patient back for a POET (Plain Old Exercise Test). This will allow me to screen for obstructive coronary disease, risk stratify and very importantly provide a prescription for exercise.  If these tests are both unremarkable then no further cardiac workup is indicated.  DM: She had this as a child.  A1c is 5.9.  She is going to see an endocrinologist because she says her blood sugar goes up and down.  Dyslipidemia: LDL is 131 and HDL of 88.  Goals of therapy will be based on the above testing.   Current medicines are reviewed at length with the patient today.  The patient does not have concerns regarding medicines.  The following changes have been made:  no change  Labs/ tests ordered today include:   Orders Placed This Encounter  Procedures   CT CARDIAC SCORING (SELF PAY ONLY)   Exercise Tolerance Test   EKG 12-Lead     Disposition:   FU with as needed based on the results of the above.   Signed, Minus Breeding, MD  05/21/2022 12:13 PM    [place]

## 2022-05-21 ENCOUNTER — Encounter: Payer: Self-pay | Admitting: Cardiology

## 2022-05-21 ENCOUNTER — Ambulatory Visit (HOSPITAL_COMMUNITY): Payer: Managed Care, Other (non HMO) | Attending: Cardiology

## 2022-05-21 ENCOUNTER — Ambulatory Visit: Payer: Managed Care, Other (non HMO) | Attending: Cardiology | Admitting: Cardiology

## 2022-05-21 VITALS — BP 108/78 | HR 77 | Ht 65.0 in | Wt 154.0 lb

## 2022-05-21 DIAGNOSIS — E118 Type 2 diabetes mellitus with unspecified complications: Secondary | ICD-10-CM | POA: Diagnosis not present

## 2022-05-21 DIAGNOSIS — R072 Precordial pain: Secondary | ICD-10-CM | POA: Insufficient documentation

## 2022-05-21 NOTE — Patient Instructions (Signed)
Medication Instructions:  Your physician recommends that you continue on your current medications as directed. Please refer to the Current Medication list given to you today.  *If you need a refill on your cardiac medications before your next appointment, please call your pharmacy*   Lab Work: NONE If you have labs (blood work) drawn today and your tests are completely normal, you will receive your results only by: [place] (if you have MyChart) OR A paper copy in the mail If you have any lab test that is abnormal or we need to change your treatment, we will call you to review the results.   Testing/Procedures: Your physician has requested that you have an exercise tolerance test. For further information please visit [email or web address]. Please also follow instruction sheet, as given.   Dr. Minus Breeding has ordered a CT coronary calcium score.   Test locations:  [place]   This is $99 out of pocket.   Coronary CalciumScan A coronary calcium scan is an imaging test used to look for deposits of calcium and other fatty materials (plaques) in the inner lining of the blood vessels of the heart (coronary arteries). These deposits of calcium and plaques can partly clog and narrow the coronary arteries without producing any symptoms or warning signs. This puts a person at risk for a heart attack. This test can detect these deposits before symptoms develop. Tell a health care provider about: Any allergies you have. All medicines you are taking, including vitamins, herbs, eye drops, creams, and over-the-counter medicines. Any problems you or family members have had with anesthetic medicines. Any blood disorders you have. Any surgeries you have had. Any medical conditions you have. Whether you are pregnant or may be pregnant. What are the risks? Generally, this is a safe procedure. However, problems may occur, including: Harm to a pregnant woman and  her unborn baby. This test involves the use of radiation. Radiation exposure can be dangerous to a pregnant woman and her unborn baby. If you are pregnant, you generally should not have this procedure done. Slight increase in the risk of cancer. This is because of the radiation involved in the test. What happens before the procedure? No preparation is needed for this procedure. What happens during the procedure? You will undress and remove any jewelry around your neck or chest. You will put on a hospital gown. Sticky electrodes will be placed on your chest. The electrodes will be connected to an electrocardiogram (ECG) machine to record a tracing of the electrical activity of your heart. A CT scanner will take pictures of your heart. During this time, you will be asked to lie still and hold your breath for 2-3 seconds while a picture of your heart is being taken. The procedure may vary among health care providers and hospitals. What happens after the procedure? You can get dressed. You can return to your normal activities. It is up to you to get the results of your test. Ask your health care provider, or the department that is doing the test, when your results will be ready. Summary A coronary calcium scan is an imaging test used to look for deposits of calcium and other fatty materials (plaques) in the inner lining of the blood vessels of the heart (coronary arteries). Generally, this is a safe procedure. Tell your health care provider if you are pregnant or may be pregnant. No preparation is needed for this procedure. A CT scanner will take pictures of your heart. You  can return to your normal activities after the scan is done. This information is not intended to replace advice given to you by your health care provider. Make sure you discuss any questions you have with your health care provider. Document Released: 10/09/2007 Document Revised: 03/01/2016 Document Reviewed: 03/01/2016 Elsevier  Interactive Patient Education  2017 [place]: At [facility], you and your health needs are our priority.  As part of our continuing mission to provide you with exceptional heart care, we have created designated Provider Care Teams.  These Care Teams include your primary Cardiologist (physician) and Advanced Practice Providers (APPs -  Physician Assistants and Nurse Practitioners) who all work together to provide you with the care you need, when you need it.  We recommend signing up for the patient portal called "MyChart".  Sign up information is provided on this After Visit Summary.  MyChart is used to connect with patients for Virtual Visits (Telemedicine).  Patients are able to view lab/test results, encounter notes, upcoming appointments, etc.  Non-urgent messages can be sent to your provider as well.   To learn more about what you can do with MyChart, go to [email or web address].    Your next appointment:   As Needed   Provider:   Minus Breeding, MD     Your physician has requested that you have an exercise tolerance test.  Please also follow instruction sheet, as given. This will take place at [address], [address] Do not drink or eat foods with caffeine for 24 hours before the test. (Chocolate, coffee, tea, or energy drinks) If you use an inhaler, bring it with you to the test. Do not smoke for 4 hours before the test. Wear comfortable shoes and clothing.

## 2022-05-24 ENCOUNTER — Telehealth: Payer: Self-pay | Admitting: Cardiology

## 2022-05-24 LAB — EXERCISE TOLERANCE TEST
Estimated workload: 11.7
Exercise duration (min): 10 min
Exercise duration (sec): 1 s
MPHR: 172 {beats}/min
Peak HR: 193 {beats}/min
Percent HR: 112 %
Rest HR: 82 {beats}/min

## 2022-05-24 NOTE — Telephone Encounter (Signed)
Pt would like a call back to discuss results of recent test and also discuss possible appt.

## 2022-05-24 NOTE — Telephone Encounter (Signed)
Left message that results from ETT are pending provider review. Will route to him

## 2022-05-25 ENCOUNTER — Encounter: Payer: Self-pay | Admitting: *Deleted

## 2022-05-25 ENCOUNTER — Other Ambulatory Visit: Payer: Self-pay | Admitting: *Deleted

## 2022-05-25 DIAGNOSIS — R072 Precordial pain: Secondary | ICD-10-CM

## 2022-05-25 NOTE — Telephone Encounter (Signed)
pt aware of results

## 2022-06-02 ENCOUNTER — Telehealth (HOSPITAL_COMMUNITY): Payer: Self-pay | Admitting: Emergency Medicine

## 2022-06-02 ENCOUNTER — Other Ambulatory Visit (HOSPITAL_COMMUNITY): Payer: Self-pay

## 2022-06-02 ENCOUNTER — Telehealth (HOSPITAL_COMMUNITY): Payer: Self-pay | Admitting: *Deleted

## 2022-06-02 DIAGNOSIS — R079 Chest pain, unspecified: Secondary | ICD-10-CM

## 2022-06-02 MED ORDER — IVABRADINE HCL 5 MG PO TABS
10.0000 mg | ORAL_TABLET | Freq: Once | ORAL | 0 refills | Status: AC
  Filled 2022-06-02: qty 2, 1d supply, fill #0

## 2022-06-02 NOTE — Telephone Encounter (Signed)
Reaching out to patient to offer assistance regarding upcoming cardiac imaging study; pt verbalizes understanding of appt date/time, parking situation and where to check in, pre-test NPO status and medications ordered, and verified current allergies; name and call back number provided for further questions should they arise Marchia Bond RN Navigator Cardiac Imaging [facility] [phone] office [phone] cell  Arrival [address] R arm restriction d/t BCA '10mg'$  ivabradine 2 hr prior  Aware contrast/nitro

## 2022-06-02 NOTE — Telephone Encounter (Signed)
Attempted to call patient regarding upcoming cardiac CT appointment. °Left message on voicemail with name and callback number ° °Corda Shutt RN Navigator Cardiac Imaging °[facility] °[phone] Office °[phone] Cell ° °

## 2022-06-04 ENCOUNTER — Ambulatory Visit (HOSPITAL_COMMUNITY)
Admission: RE | Admit: 2022-06-04 | Discharge: 2022-06-04 | Disposition: A | Discharge status: Home / Self Care | Payer: Managed Care, Other (non HMO) | Source: Ambulatory Visit | Attending: Cardiology | Admitting: Cardiology

## 2022-06-04 DIAGNOSIS — R072 Precordial pain: Secondary | ICD-10-CM | POA: Diagnosis not present

## 2022-06-04 MED ORDER — IOHEXOL 350 MG/ML SOLN
100.0000 mL | Freq: Once | INTRAVENOUS | Status: AC | PRN
  Administered 2022-06-04: 100 mL via INTRAVENOUS

## 2022-06-04 MED ORDER — NITROGLYCERIN 0.4 MG SL SUBL
SUBLINGUAL_TABLET | SUBLINGUAL | Status: AC
  Administered 2022-06-04: 0.8 mg via SUBLINGUAL
  Filled 2022-06-04: qty 2

## 2022-06-04 MED ORDER — NITROGLYCERIN 0.4 MG SL SUBL: 0.8000 mg | SUBLINGUAL_TABLET | SUBLINGUAL | Status: DC | PRN

## 2022-06-11 ENCOUNTER — Encounter: Payer: Self-pay | Admitting: Cardiology

## 2022-06-11 ENCOUNTER — Telehealth: Payer: Self-pay | Admitting: Cardiology

## 2022-06-11 NOTE — Telephone Encounter (Signed)
Patient given stress test and CCTA results. Pt reports SOB with exertion and still has same chest pain. She would like Dr. Rosezella Florida opinion on what to expect next to resolve her issues.

## 2022-06-11 NOTE — Telephone Encounter (Signed)
Patient calling for CT results. 

## 2022-06-15 NOTE — Telephone Encounter (Signed)
LMTCB

## 2022-06-15 NOTE — Telephone Encounter (Signed)
Multiple encounters open. Please see mychart message for more detail.

## 2022-06-15 NOTE — Telephone Encounter (Signed)
Pt given recommendation per Dr. Percival Spanish. Patient stated she wold like to be seen in cardio because with the slightest exertion, she becomes SOB. She gave an example of doing stretching and had to stop to catch her breath. She has had constant tightness under left breast. Appointment made with Arnold Long for 3/1.

## 2022-06-15 NOTE — Telephone Encounter (Signed)
Minus Breeding, MD  You    Needs follow up office visit with me please.  Thanks.   Left message for pt to call back.

## 2022-06-23 NOTE — Progress Notes (Signed)
Cardiology Clinic Note   Patient Name: Natalie Cardenas Date of Encounter: 06/25/2022  Primary Care Provider:  Jake Samples, PA-C Primary Cardiologist:  Minus Breeding, MD  Patient Profile    47 year old female with history of precordial chest pain, dyslipidemia, and diabetes as a child. Seen last by Dr. Percival Spanish on 05/21/2022. Coronary CTA with calcium score was 0, consideration for non-cardiac causes of chest pain.   Past Medical History    Past Medical History:  Diagnosis Date   Anxiety    Breast cancer ([place])    Depression    Diabetes mellitus without complication ([place])    Thyroid nodule    Past Surgical History:  Procedure Laterality Date   BREAST RECONSTRUCTION WITH PLACEMENT OF TISSUE EXPANDER AND FLEX HD (ACELLULAR HYDRATED DERMIS) Bilateral 04/29/2020   Procedure: IMMEDIATE BILATERAL BREAST RECONSTRUCTION WITH PLACEMENT OF TISSUE EXPANDER AND FLEX HD (ACELLULAR HYDRATED DERMIS);  Surgeon: [name], MD;  Location: [facility];  Service: Plastics;  Laterality: Bilateral;   NIPPLE SPARING MASTECTOMY WITH SENTINEL LYMPH NODE BIOPSY Bilateral 04/29/2020   Procedure: BILATERAL NIPPLE SPARING MASTECTOMIES WITH RIGHT AXILLARY SENTINEL LYMPH NODE BIOPSY;  Surgeon: Rolm Bookbinder, MD;  Location: [facility];  Service: General;  Laterality: Bilateral;  BILATERAL PEC BLOCK, RNFA   REMOVAL OF BILATERAL TISSUE EXPANDERS WITH PLACEMENT OF BILATERAL BREAST IMPLANTS Bilateral 06/23/2020   Procedure: REMOVAL OF BILATERAL TISSUE EXPANDERS WITH PLACEMENT OF BILATERAL BREAST IMPLANTS;  Surgeon: [name], MD;  Location: [facility];  Service: Plastics;  Laterality: Bilateral;  1.5 hours   TONSILLECTOMY      Allergies  Allergies  Allergen Reactions   Gabapentin Shortness Of Breath   Lyrica Cr [Pregabalin Er] Other (See Comments)    Unable to think clear.   Other Rash    Chlorahexadine wipe/soap    History of Present Illness    Mrs.  Berroa returns today with ongoing complaints of chest discomfort substernal and tachycardia.  The patient states that since having had breast cancer with bilateral mastectomies, she has had decreased energy, she has gained 20 pounds, and notices that she is easily short of breath.  She states that she finds it hard to climb stairs without having some issues with shortness of breath and tachycardia.  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  Feelings as if she cannot breathe.  She has been diligent about following up on her appointments and taking her medications.  She has been followed by pulmonary.  She is to be a runner but states that she is unable to do that now due to her lack of energy.  She is not on estrogen suppression status post breast cancer.  Home Medications    Current Outpatient Medications  Medication Sig Dispense Refill   acetaminophen (TYLENOL) 500 MG tablet Take 500 mg by mouth every 6 (six) hours as needed.     albuterol (VENTOLIN HFA) 108 (90 Base) MCG/ACT inhaler      benzonatate (TESSALON) 200 MG capsule Take 1 capsule (200 mg total) by mouth 3 (three) times daily as needed for cough. 40 capsule 3   budesonide-formoterol (SYMBICORT) 80-4.5 MCG/ACT inhaler Take 2 puffs first thing in am and then another 2 puffs about 12 hours later. 1 each 12   clonazePAM (KLONOPIN) 0.5 MG tablet Take 1 tablet (0.5 mg total) by mouth 2 (two) times daily  as needed. 30 tablet 0   diphenhydrAMINE (BENADRYL) 25 MG tablet Take 25 mg by mouth at bedtime as needed.     DULoxetine (CYMBALTA) 20 MG capsule Take 1 capsule (20 mg total) by mouth daily. 30 capsule 1   famotidine (PEPCID) 20 MG tablet One after supper 30 tablet 11   ferrous sulfate 325 (65 FE) MG tablet Take by mouth.     ibuprofen (ADVIL) 200 MG tablet Take 800 mg by mouth daily. Current dose     pantoprazole (PROTONIX) 40 MG tablet Take 1  tablet (40 mg total) by mouth daily. Take 30-60 min before first meal of the day 30 tablet 2   sennosides-docusate sodium (SENOKOT-S) 8.6-50 MG tablet Take 3 tablets by mouth at bedtime.     UNABLE TO FIND daily. Med Name: vitamin b 3     UNABLE TO FIND Med Name: alka seltzer heartburn relief prn     No current facility-administered medications for this visit.     Family History    Family History  Problem Relation Age of Onset   Heart failure Mother    Cancer Father 35       esophageal cancer   Brain cancer Paternal Grandmother    Cancer Paternal Grandfather        esophageal cancer    She indicated that her mother is alive. She indicated that her father is deceased. She indicated that her paternal grandmother is deceased. She indicated that her paternal grandfather is deceased.  Social History    Social History   Socioeconomic History   Marital status: Married    Spouse name: Not on file   Number of children: 4   Years of education: Not on file   Highest education level: Not on file  Occupational History   Not on file  Tobacco Use   Smoking status: Never   Smokeless tobacco: Never  Vaping Use   Vaping Use: Never used  Substance and Sexual Activity   Alcohol use: Yes    Comment: social    Drug use: Never   Sexual activity: Yes  Other Topics Concern   Not on file  Social History Narrative   Lives at home with husband and two children, two out of the house.   Social Determinants of Health   Financial Resource Strain: Not on file  Food Insecurity: Not on file  Transportation Needs: Not on file  Physical Activity: Not on file  Stress: Not on file  Social Connections: Not on file  Intimate Partner Violence: Not on file     Review of Systems    General:  No chills, fever, night sweats or weight changes positive for 20 pound weight gain.  Cardiovascular: Positive for substernal chest pain, dyspnea on exertion, edema, orthopnea, palpitations, paroxysmal nocturnal  dyspnea. Dermatological: No rash, lesions/masses Respiratory: No cough, positive for dyspnea on exertion.  Urologic: No hematuria, dysuria Abdominal:   No nausea, vomiting, diarrhea, bright red blood per rectum, melena, or hematemesis Neurologic:  No visual changes, wkns, changes in mental status. All other systems reviewed and are otherwise negative except as noted above.     Physical Exam    VS:  BP 124/72   Pulse 86   Ht '5\' 5"'$  (1.651 m)   Wt 157 lb (71.2 kg)   SpO2 98%   BMI 26.13 kg/m  , BMI Body mass index is 26.13 kg/m.     GEN: Well nourished, well developed, in no acute distress. HEENT: normal.  Neck: Supple, no JVD, carotid bruits, or masses. Cardiac: RRR, no murmurs, rubs, or gallops. No clubbing, cyanosis, edema.  Radials/DP/PT 2+ and equal bilaterally.  Respiratory:  Respirations regular and unlabored, clear to auscultation bilaterally. GI: Soft, nontender, nondistended, BS + x 4. MS: no deformity or atrophy. Skin: warm and dry, no rash. Neuro:  Strength and sensation are intact. Psych: Normal affect.  Accessory Clinical Findings    ECG personally reviewed by me today-normal sinus rhythm heart rate of 86 bpm- No acute changes  Lab Results  Component Value Date   WBC 6.5 05/14/2022   HGB 11.5 05/14/2022   HCT 34.7 05/14/2022   MCV 94 05/14/2022   PLT 221 05/14/2022   Lab Results  Component Value Date   CREATININE 0.76 05/14/2022   BUN 17 05/14/2022   NA 143 05/14/2022   K 3.9 05/14/2022   CL 106 05/14/2022   CO2 21 05/14/2022   Lab Results  Component Value Date   ALT 15 01/07/2020   AST 16 01/07/2020   ALKPHOS 86 01/07/2020   BILITOT 0.5 01/07/2020   No results found for: "CHOL", "HDL", "LDLCALC", "LDLDIRECT", "TRIG", "CHOLHDL"  No results found for: "HGBA1C"  Review of Prior Studies: Coronary CTA 06/04/2022  1. No evidence of CAD, CADRADS = 0.   2. Coronary calcium score of 0. This was 0 percentile for age and sex matched control.   3.  Normal coronary origin with right dominance.   4. Consider non-coronary causes of chest pain  Assessment & Plan   1.  Chronic chest pressure: Described as substernal, sometimes associated with rapid heart rhythm.  Mostly located on the left side of the chest nonradiating.  This is also associated with some dyspnea on exertion.  Worsening energy level.  Cardiac CTA was negative for obstructive disease.  GXT revealed elevated heart rate out of proportion to exercise.  I will place a 7-day ZIO heart monitor to evaluate for inappropriate tachycardia, or other cardiac rhythm disturbances.  Also echocardiogram will be ordered to evaluate for structural heart disease.  Should consider GI evaluation for hiatal hernia, GERD (she is on PPI), or esophageal spasm once cardiac workup is fully completed.  2.  History of right breast cancer, status post bilateral mastectomy: Continues to have postoperative neurologic pain.  She is to follow-up with oncology and OB/GYN concerning estrogen supplementation or contraindication to do so.  3.  Chronic dyspnea on exertion: Consider deconditioning, OSA, or ongoing asthma.  Should follow-up with pulmonology.  Continue inhalers.  4.  Type 2 diabetes: Followed by PCP.  Current medicines are reviewed at length with the patient today.  I have spent 30 min's  dedicated to the care of this patient on the date of this encounter to include pre-visit review of records, assessment, management and diagnostic testing,with shared decision making.  Signed, Phill Myron. [place] Pugh, ANP, [place]   06/25/2022 10:21 AM      Office [phone] Fax [phone]  Notice: This dictation was prepared with Dragon dictation along with smaller phrase technology. Any transcriptional errors that result from this process are unintentional and may not be corrected upon review.

## 2022-06-25 ENCOUNTER — Encounter: Payer: Self-pay | Admitting: Adult Health

## 2022-06-25 ENCOUNTER — Ambulatory Visit: Payer: Managed Care, Other (non HMO) | Attending: Adult Health

## 2022-06-25 ENCOUNTER — Encounter: Payer: Self-pay | Admitting: *Deleted

## 2022-06-25 ENCOUNTER — Ambulatory Visit: Payer: Managed Care, Other (non HMO) | Attending: Adult Health | Admitting: Adult Health

## 2022-06-25 VITALS — BP 124/72 | HR 86 | Ht 65.0 in | Wt 157.0 lb

## 2022-06-25 DIAGNOSIS — R0609 Other forms of dyspnea: Secondary | ICD-10-CM | POA: Diagnosis not present

## 2022-06-25 DIAGNOSIS — R0789 Other chest pain: Secondary | ICD-10-CM | POA: Diagnosis not present

## 2022-06-25 DIAGNOSIS — R002 Palpitations: Secondary | ICD-10-CM

## 2022-06-25 DIAGNOSIS — I495 Sick sinus syndrome: Secondary | ICD-10-CM

## 2022-06-25 DIAGNOSIS — D0511 Intraductal carcinoma in situ of right breast: Secondary | ICD-10-CM

## 2022-06-25 DIAGNOSIS — E118 Type 2 diabetes mellitus with unspecified complications: Secondary | ICD-10-CM

## 2022-06-25 NOTE — Progress Notes (Unsigned)
Enrolled patient for a 7 day Zio XT monitor to be mailed to patients home   Dr Percival Spanish to read

## 2022-06-25 NOTE — Patient Instructions (Signed)
Medication Instructions:  No Changes *If you need a refill on your cardiac medications before your next appointment, please call your pharmacy*   Lab Work: No Labs If you have labs (blood work) drawn today and your tests are completely normal, you will receive your results only by: [place] (if you have MyChart) OR A paper copy in the mail If you have any lab test that is abnormal or we need to change your treatment, we will call you to review the results.   Testing/Procedures: [address]. Your physician has requested that you have an echocardiogram. Echocardiography is a painless test that uses sound waves to create images of your heart. It provides your doctor with information about the size and shape of your heart and how well your heart's chambers and valves are working. This procedure takes approximately one hour. There are no restrictions for this procedure. Please do NOT wear cologne, perfume, aftershave, or lotions (deodorant is allowed). Please arrive 15 minutes prior to your appointment time.   ZIO XT- Long Term Monitor Instructions  Your physician has requested you wear a ZIO patch monitor for 7 days.  This is a single patch monitor. [employer] supplies one patch monitor per enrollment. Additional stickers are not available. Please do not apply patch if you will be having a Nuclear Stress Test,  Echocardiogram, Cardiac CT, MRI, or Chest Xray during the period you would be wearing the  monitor. The patch cannot be worn during these tests. You cannot remove and re-apply the  ZIO XT patch monitor.  Your ZIO patch monitor will be mailed 3 day USPS to your address on file. It may take 3-5 days  to receive your monitor after you have been enrolled.  Once you have received your monitor, please review the enclosed instructions. Your monitor  has already been registered assigning a specific monitor serial # to you.  Billing and Patient Assistance Program  Information  We have supplied [employer] with any of your insurance information on file for billing purposes. [employer] offers a sliding scale Patient Assistance Program for patients that do not have  insurance, or whose insurance does not completely cover the cost of the ZIO monitor.  You must apply for the Patient Assistance Program to qualify for this discounted rate.  To apply, please call [employer] at [phone], select option 4, select option 2, ask to apply for  Patient Assistance Program. Theodore Demark will ask your household income, and how many people  are in your household. They will quote your out-of-pocket cost based on that information.  [employer] will also be able to set up a 40-month interest-free payment plan if needed.  Applying the monitor   Shave hair from upper left chest.  Hold abrader disc by orange tab. Rub abrader in 40 strokes over the upper left chest as  indicated in your monitor instructions.  Clean area with 4 enclosed alcohol pads. Let dry.  Apply patch as indicated in monitor instructions. Patch will be placed under collarbone on left  side of chest with arrow pointing upward.  Rub patch adhesive wings for 2 minutes. Remove white label marked "1". Remove the white  label marked "2". Rub patch adhesive wings for 2 additional minutes.  While looking in a mirror, press and release button in center of patch. A small green light will  flash 3-4 times. This will be your only indicator that the monitor has been turned on.  Do not shower for the first 24  hours. You may shower after the first 24 hours.  Press the button if you feel a symptom. You will hear a small click. Record Date, Time and  Symptom in the Patient Logbook.  When you are ready to remove the patch, follow instructions on the last 2 pages of Patient  Logbook. Stick patch monitor onto the last page of Patient Logbook.  Place Patient Logbook in the blue and white box. Use locking tab on box and tape box closed   securely. The blue and white box has prepaid postage on it. Please place it in the mailbox as  soon as possible. Your physician should have your test results approximately 7 days after the  monitor has been mailed back to [facility].  Call [facility] at [phone] if you have questions regarding  your ZIO XT patch monitor. Call them immediately if you see an orange light blinking on your  monitor.  If your monitor falls off in less than 4 days, contact our Monitor department at [phone].  If your monitor becomes loose or falls off after 4 days call [employer] at [phone] for  suggestions on securing your monitor  Follow-Up: At [facility], you and your health needs are our priority.  As part of our continuing mission to provide you with exceptional heart care, we have created designated Provider Care Teams.  These Care Teams include your primary Cardiologist (physician) and Advanced Practice Providers (APPs -  Physician Assistants and Nurse Practitioners) who all work together to provide you with the care you need, when you need it.  We recommend signing up for the patient portal called "MyChart".  Sign up information is provided on this After Visit Summary.  MyChart is used to connect with patients for Virtual Visits (Telemedicine).  Patients are able to view lab/test results, encounter notes, upcoming appointments, etc.  Non-urgent messages can be sent to your provider as well.   To learn more about what you can do with MyChart, go to [email or web address].    Your next appointment:   1 month(s)  Provider:   Jory Sims, DNP, ANP   or, Minus Breeding, MD

## 2022-06-30 DIAGNOSIS — R002 Palpitations: Secondary | ICD-10-CM | POA: Diagnosis not present

## 2022-06-30 DIAGNOSIS — I495 Sick sinus syndrome: Secondary | ICD-10-CM

## 2022-07-02 ENCOUNTER — Ambulatory Visit: Payer: Managed Care, Other (non HMO) | Admitting: "Endocrinology

## 2022-07-02 ENCOUNTER — Telehealth: Payer: Self-pay | Admitting: "Endocrinology

## 2022-07-02 ENCOUNTER — Encounter: Payer: Self-pay | Admitting: "Endocrinology

## 2022-07-02 VITALS — BP 112/68 | HR 80 | Ht 65.0 in | Wt 156.2 lb

## 2022-07-02 DIAGNOSIS — E782 Mixed hyperlipidemia: Secondary | ICD-10-CM | POA: Insufficient documentation

## 2022-07-02 DIAGNOSIS — E161 Other hypoglycemia: Secondary | ICD-10-CM | POA: Diagnosis not present

## 2022-07-02 DIAGNOSIS — R7303 Prediabetes: Secondary | ICD-10-CM | POA: Insufficient documentation

## 2022-07-02 NOTE — Telephone Encounter (Signed)
Pt said you wanted a ultrasound before next visit but just to clarify because I do not see one ordered.

## 2022-07-02 NOTE — Patient Instructions (Signed)

## 2022-07-02 NOTE — Progress Notes (Signed)
Endocrinology Consult Note                                            07/02/2022, 1:46 PM   Subjective:    Patient ID: Natalie Cardenas, female    DOB: 13-Sep-1975, PCP Jake Samples, PA-C   Past Medical History:  Diagnosis Date   Anxiety    Breast cancer ([place])    Depression    Diabetes mellitus without complication ([place])    Thyroid nodule    Past Surgical History:  Procedure Laterality Date   BREAST RECONSTRUCTION WITH PLACEMENT OF TISSUE EXPANDER AND FLEX HD (ACELLULAR HYDRATED DERMIS) Bilateral 04/29/2020   Procedure: IMMEDIATE BILATERAL BREAST RECONSTRUCTION WITH PLACEMENT OF TISSUE EXPANDER AND FLEX HD (ACELLULAR HYDRATED DERMIS);  Surgeon: [name], MD;  Location: [facility];  Service: Plastics;  Laterality: Bilateral;   NIPPLE SPARING MASTECTOMY WITH SENTINEL LYMPH NODE BIOPSY Bilateral 04/29/2020   Procedure: BILATERAL NIPPLE SPARING MASTECTOMIES WITH RIGHT AXILLARY SENTINEL LYMPH NODE BIOPSY;  Surgeon: Rolm Bookbinder, MD;  Location: [facility];  Service: General;  Laterality: Bilateral;  BILATERAL PEC BLOCK, RNFA   REMOVAL OF BILATERAL TISSUE EXPANDERS WITH PLACEMENT OF BILATERAL BREAST IMPLANTS Bilateral 06/23/2020   Procedure: REMOVAL OF BILATERAL TISSUE EXPANDERS WITH PLACEMENT OF BILATERAL BREAST IMPLANTS;  Surgeon: [name], MD;  Location: [facility];  Service: Plastics;  Laterality: Bilateral;  1.5 hours   TONSILLECTOMY     Social History   Socioeconomic History   Marital status: Married    Spouse name: Not on file   Number of children: 4   Years of education: Not on file   Highest education level: Not on file  Occupational History   Not on file  Tobacco Use   Smoking status: Never   Smokeless tobacco: Never  Vaping Use   Vaping Use: Never used  Substance and Sexual Activity   Alcohol use: Yes    Comment: social    Drug use: Never   Sexual activity: Yes  Other Topics Concern   Not on file   Social History Narrative   Lives at home with husband and two children, two out of the house.   Social Determinants of Health   Financial Resource Strain: Not on file  Food Insecurity: Not on file  Transportation Needs: Not on file  Physical Activity: Not on file  Stress: Not on file  Social Connections: Not on file   Family History  Problem Relation Age of Onset   Diabetes Mother    Heart failure Mother    Asthma Mother    Cancer Father 35       esophageal cancer   Diabetes Maternal Grandmother    Brain cancer Paternal Grandmother    Cancer Paternal Grandfather        esophageal cancer    Outpatient Encounter Medications as of 07/02/2022  Medication Sig   acarbose (PRECOSE) 25 MG tablet Take 25 mg by mouth daily.   Prenatal Vit-Fe Fumarate-FA (PRENATAL VITAMIN PO) Take 1 tablet by mouth at bedtime.   [DISCONTINUED] escitalopram (LEXAPRO) 10 MG tablet Take 10 mg by mouth daily.   [DISCONTINUED] mometasone (ASMANEX) 220 MCG/ACT inhaler Inhale 2 puffs into the lungs 2 (two) times daily.   [DISCONTINUED] norethindrone-ethinyl estradiol (FEMHRT LOW DOSE) 0.5-2.5 MG-MCG tablet Take 1 tablet by mouth  daily.   acetaminophen (TYLENOL) 500 MG tablet Take 500 mg by mouth every 6 (six) hours as needed.   albuterol (VENTOLIN HFA) 108 (90 Base) MCG/ACT inhaler    benzonatate (TESSALON) 200 MG capsule Take 1 capsule (200 mg total) by mouth 3 (three) times daily as needed for cough.   budesonide-formoterol (SYMBICORT) 80-4.5 MCG/ACT inhaler Take 2 puffs first thing in am and then another 2 puffs about 12 hours later.   clonazePAM (KLONOPIN) 0.5 MG tablet Take 1 tablet (0.5 mg total) by mouth 2 (two) times daily as needed.   diphenhydrAMINE (BENADRYL) 25 MG tablet Take 25 mg by mouth at bedtime as needed.   DULoxetine (CYMBALTA) 20 MG capsule Take 1 capsule (20 mg total) by mouth daily. (Patient not taking: Reported on 07/02/2022)   famotidine (PEPCID) 20 MG tablet One after supper   ferrous  sulfate 325 (65 FE) MG tablet Take by mouth.   ibuprofen (ADVIL) 200 MG tablet Take 800 mg by mouth daily. Current dose   pantoprazole (PROTONIX) 40 MG tablet Take 1 tablet (40 mg total) by mouth daily. Take 30-60 min before first meal of the day   sennosides-docusate sodium (SENOKOT-S) 8.6-50 MG tablet Take 3 tablets by mouth at bedtime.   UNABLE TO FIND daily. Med Name: vitamin b 3   UNABLE TO FIND Med Name: alka seltzer heartburn relief prn   No facility-administered encounter medications on file as of 07/02/2022.   ALLERGIES: Allergies  Allergen Reactions   Gabapentin Shortness Of Breath   Lyrica Cr [Pregabalin Er] Other (See Comments)    Unable to think clear.   Other Rash    Chlorahexadine wipe/soap    VACCINATION STATUS: Immunization History  Administered Date(s) Administered   Influenza,inj,quad, With Preservative 02/25/2012, 02/02/2019   Influenza-Unspecified 02/25/2012   PFIZER(Purple Top)SARS-COV-2 Vaccination 05/16/2019, 06/06/2019    HPI Natalie Cardenas is 47 y.o. female who presents today with a medical history as above. she is being seen in consultation for prediabetes requested by Jake Samples, PA-C.   History is obtained directly from the patient as well as her chart review.  According to the patient, she was never diagnosed with diabetes, however with A1c between 5.9 and 6.1% over the last couple of years consistent with prediabetes.  Her major concern is the fact that she gets symptoms including shakiness, tremors, anxiety, progressive weight gain.  She was given a trial of acarbose 25 mg p.o. daily with no significant improvement.  She weighs 156 pounds, and reports that this is the heaviest she ever weighed.  She is not following particular diet.  She is not monitoring blood glucose regularly.  She has dyslipidemia, with LDL at 110, not on treatment.  Her other medical problems include breast cancer status post bilateral mastectomy in January 2022 and requested  surgery in February 2022. She is not on any antidiabetic medications other than acarbose at this point.   Review of Systems  Constitutional: + Progressive weight gain, +fatigue, no subjective hyperthermia, no subjective hypothermia Eyes: no blurry vision, no xerophthalmia ENT: no sore throat, no nodules palpated in throat, no dysphagia/odynophagia, no hoarseness Cardiovascular: no Chest Pain, no Shortness of Breath, no palpitations, no leg swelling Respiratory: no cough, no shortness of breath Gastrointestinal: no Nausea/Vomiting/Diarhhea Musculoskeletal: no muscle/joint aches Skin: no rashes Neurological: + tremors, no numbness, no tingling, no dizziness Psychiatric: no depression, no anxiety  Objective:       07/02/2022    9:44 AM 06/25/2022    9:14 AM  06/04/2022   10:53 AM  Vitals with BMI  Height '5\' 5"'$  '5\' 5"'$    Weight 156 lbs 3 oz 157 lbs   BMI [id number]   Systolic [id number]   Diastolic 68 72   Pulse 80 86 65    BP 112/68   Pulse 80   Ht '5\' 5"'$  (1.651 m)   Wt 156 lb 3.2 oz (70.9 kg)   BMI 25.99 kg/m   Wt Readings from Last 3 Encounters:  07/02/22 156 lb 3.2 oz (70.9 kg)  06/25/22 157 lb (71.2 kg)  05/21/22 154 lb (69.9 kg)    Physical Exam  Constitutional:  Body mass index is 25.99 kg/m.,  not in acute distress, normal state of mind Eyes: PERRLA, EOMI, no exophthalmos  CMP ( most recent) CMP     Component Value Date/Time   NA 143 05/14/2022 1615   K 3.9 05/14/2022 1615   CL 106 05/14/2022 1615   CO2 21 05/14/2022 1615   GLUCOSE 119 (H) 05/14/2022 1615   GLUCOSE 120 (H) 06/18/2020 1455   BUN 17 05/14/2022 1615   CREATININE 0.76 05/14/2022 1615   CREATININE 0.73 01/07/2020 1619   CALCIUM 9.4 05/14/2022 1615   PROT 6.6 01/07/2020 1619   ALBUMIN 3.7 01/07/2020 1619   AST 16 01/07/2020 1619   ALT 15 01/07/2020 1619   ALKPHOS 86 01/07/2020 1619   BILITOT 0.5 01/07/2020 1619   GFRNONAA >60 06/18/2020 1455   GFRNONAA >60 01/07/2020 1619   GFRAA >60  01/07/2020 1619   January 2024 labs include A1c of 5.9%, TSH 1.47, total T47.1, total T3 136.  Prior lipid panel showed total cholesterol 193, trig 58, HDL 72, LDL 110.  Recent Results (from the past 2160 hour(s))  Lipid panel     Status: Abnormal   Collection Time: 05/07/22 12:00 AM  Result Value Ref Range   Triglycerides 58 40 - 160   Cholesterol 193 0 - 200   HDL 72 (A) 35 - 70   LDL Cholesterol 110   Hemoglobin A1c     Status: None   Collection Time: 05/07/22 12:00 AM  Result Value Ref Range   Hemoglobin A1C 5.9   TSH     Status: None   Collection Time: 05/07/22 12:00 AM  Result Value Ref Range   TSH 1.47 0.41 - 5.90    Comment: t4 7.1, tt3 136  POCT EXHALED NITRIC OXIDE     Status: Normal   Collection Time: 05/14/22 11:07 AM  Result Value Ref Range   FeNO level (ppb) 10   Basic metabolic panel     Status: Abnormal   Collection Time: 05/14/22  4:15 PM  Result Value Ref Range   Glucose 119 (H) 70 - 99 mg/dL   BUN 17 6 - 24 mg/dL   Creatinine, Ser 0.76 0.57 - 1.00 mg/dL   eGFR 97 >59 mL/min/1.73   BUN/Creatinine Ratio 22 9 - 23   Sodium 143 134 - 144 mmol/L   Potassium 3.9 3.5 - 5.2 mmol/L   Chloride 106 96 - 106 mmol/L   CO2 21 20 - 29 mmol/L   Calcium 9.4 8.7 - 10.2 mg/dL  Brain natriuretic peptide     Status: None   Collection Time: 05/14/22  4:15 PM  Result Value Ref Range   BNP 7.8 0.0 - 100.0 pg/mL    Comment: Siemens ADVIA Centaur XP methodology  CBC with Differential/Platelet     Status: Abnormal   Collection Time: 05/14/22  4:15  PM  Result Value Ref Range   WBC 6.5 3.4 - 10.8 x10E3/uL   RBC 3.71 (L) 3.77 - 5.28 x10E6/uL   Hemoglobin 11.5 11.1 - 15.9 g/dL   Hematocrit 34.7 34.0 - 46.6 %   MCV 94 79 - 97 fL   MCH 31.0 26.6 - 33.0 pg   MCHC 33.1 31.5 - 35.7 g/dL   RDW 12.3 11.7 - 15.4 %   Platelets 221 150 - 450 x10E3/uL   Neutrophils 52 Not Estab. %   Lymphs 33 Not Estab. %   Monocytes 6 Not Estab. %   Eos 8 Not Estab. %   Basos 1 Not Estab. %    Neutrophils Absolute 3.4 1.4 - 7.0 x10E3/uL   Lymphocytes Absolute 2.2 0.7 - 3.1 x10E3/uL   Monocytes Absolute 0.4 0.1 - 0.9 x10E3/uL   EOS (ABSOLUTE) 0.5 (H) 0.0 - 0.4 x10E3/uL   Basophils Absolute 0.1 0.0 - 0.2 x10E3/uL   Immature Granulocytes 0 Not Estab. %   Immature Grans (Abs) 0.0 0.0 - 0.1 x10E3/uL  D-dimer, quantitative     Status: None   Collection Time: 05/14/22  4:15 PM  Result Value Ref Range   D-DIMER <0.20 0.00 - 0.49 mg/L FEU    Comment: According to the assay manufacturer's published package insert, a normal (<0.50 mg/L FEU) D-dimer result in conjunction with a non-high clinical probability assessment, excludes deep vein thrombosis (DVT) and pulmonary embolism (PE) with high sensitivity. D-dimer values increase with age and this can make VTE exclusion of an older population difficult. To address this, the [employer], based on best available evidence and recent guidelines, recommends that clinicians use age-adjusted D-dimer thresholds in patients greater than 90 years of age with: a) a low probability of PE who do not meet all Pulmonary Embolism Rule Out Criteria, or b) in those with intermediate probability of PE. The formula for an age-adjusted D-dimer cut-off is "age/100". For example, a 49 year old patient would have an age-adjusted cut-off of 0.60 mg/L FEU and an 49 year old 0.80 mg/L FEU.   Sedimentation rate     Status: None   Collection Time: 05/14/22  4:15 PM  Result Value Ref Range   Sed Rate 4 0 - 32 mm/hr  Exercise Tolerance Test     Status: None   Collection Time: 05/21/22  4:13 PM  Result Value Ref Range   Rest HR 82.0 bpm   Rest BP 122/72 mmHg   Exercise duration (min) 10 min   Exercise duration (sec) 1 sec   Estimated workload 11.7    Peak HR 193 bpm   Peak BP 190/67 mmHg   MPHR 172 bpm   Percent HR 112.0 %      Assessment & Plan:   1. Prediabetes   2.  Reactive hypoglycemia   3.  Hyperlipidemia  - Shalice Felder  is  being seen at a kind request of Jake Samples, PA-C. - I have reviewed her available endocrine records and clinically evaluated the patient. - Based on these reviews, she has prediabetes with A1c of 5.9-6.1%, hyperlipidemia, and reactive hypoglycemia.  Review of her medical history does not indicate chronic liver disease nor chronic kidney disease. To address these related comorbidities, she is a good candidate for lifestyle medicine. - she acknowledges that there is a room for improvement in her food and drink choices. - Suggestion is made for her to avoid simple carbohydrates  from her diet including Cakes, Sweet Desserts, Ice Cream,  Soda (diet and regular), Sweet Tea, Candies, Chips, Cookies, Store Bought Juices, Alcohol , Artificial Sweeteners,  Coffee Creamer, and "Sugar-free" Products, Lemonade. This will help patient to have more stable blood glucose profile and potentially avoid unintended weight gain.  The following Lifestyle Medicine recommendations according to [facility]) were discussed and and offered to patient and she  agrees to start the journey:  A. Whole Foods, Plant-Based Nutrition comprising of fruits and vegetables, plant-based proteins, whole-grain carbohydrates was discussed in detail with the patient.   A list for source of those nutrients were also provided to the patient.  Patient will use only water or unsweetened tea for hydration. B.  The need to stay away from risky substances including alcohol, smoking; obtaining 7 to 9 hours of restorative sleep, at least 150 minutes of moderate intensity exercise weekly, the importance of healthy social connections,  and stress management techniques were discussed. C.  A full color page of  Calorie density of various food groups per pound showing examples of each food groups was provided to the patient.  -She will be taken off of acarbose at this time. She will return in 3 months with repeat labs  and for  repeat A1c.  Based on her recent labs, she is euthyroid.  However, she mentions that previous concern of goiter, she will be examined and if necessary she will be sent for repeat surveillance ultrasound of the thyroid.  - she is advised to maintain close follow up with Jake Samples, PA-C for primary care needs.   - Time spent with the patient: 45 minutes, of which >50% was spent in  counseling her about her reactive hypoglycemia, hyperlipidemia, prediabetes and the rest in obtaining information about her symptoms, reviewing her previous labs/studies ( including abstractions from other facilities),  evaluations, and treatments,  and developing a plan to confirm diagnosis and long term treatment based on the latest standards of care/guidelines; and documenting her care.  Emmalynn Nancarrow participated in the discussions, expressed understanding, and voiced agreement with the above plans.  All questions were answered to her satisfaction. she is encouraged to contact clinic should she have any questions or concerns prior to her return visit.  Follow up plan: Return in about 3 months (around 10/02/2022) for Fasting Labs  in AM B4 8, A1c -NV.   Glade Lloyd, MD [facility] [address] [place], [place] [address] Phone: [phone]  Fax: [phone]     07/02/2022, 1:46 PM  This note was partially dictated with voice recognition software. Similar sounding words can be transcribed inadequately or may not  be corrected upon review.

## 2022-07-09 ENCOUNTER — Ambulatory Visit (HOSPITAL_COMMUNITY): Payer: Managed Care, Other (non HMO)

## 2022-07-16 ENCOUNTER — Ambulatory Visit (HOSPITAL_BASED_OUTPATIENT_CLINIC_OR_DEPARTMENT_OTHER): Payer: Managed Care, Other (non HMO)

## 2022-07-16 DIAGNOSIS — R002 Palpitations: Secondary | ICD-10-CM | POA: Diagnosis not present

## 2022-07-16 DIAGNOSIS — I495 Sick sinus syndrome: Secondary | ICD-10-CM

## 2022-07-16 LAB — ECHOCARDIOGRAM COMPLETE
AR max vel: 2.67 cm2
AV Area VTI: 2.71 cm2
AV Area mean vel: 2.68 cm2
AV Mean grad: 3 mmHg
AV Peak grad: 4.5 mmHg
Ao pk vel: 1.06 m/s
Area-P 1/2: 4.44 cm2
Calc EF: 65.5 %
S' Lateral: 2.6 cm
Single Plane A2C EF: 62.9 %
Single Plane A4C EF: 69.7 %

## 2022-07-21 ENCOUNTER — Other Ambulatory Visit: Payer: Self-pay | Admitting: Nurse Practitioner

## 2022-07-21 NOTE — Progress Notes (Deleted)
Cardiology Clinic Note   Patient Name: Natalie Cardenas Date of Encounter: 07/21/2022  Primary Care Provider:  Jake Samples, PA-C Primary Cardiologist:  Minus Breeding, MD  Patient Profile   47 year old female with history of precordial chest pain, dyslipidemia, and diabetes as a child. She has a history of a thyroid nodule and is being followed by  Dr. Dorris Fetch endocrinologist. She has been recommended for lifestyle management including diet restrictions of carbohydrates,and increase exercise.   Seen last by Dr. Percival Spanish on 05/21/2022. Coronary CTA with calcium score was 0, consideration for non-cardiac causes of chest pain. Last seen in the office on 06/25/2022 for complaints of rapid HR and palpitations. Zio monitor was placed along with echocardiogram for thorough assessment.   Zio monitor revealed predominately NSR with rate supraventricular ectopy.  Echo revealed normal EF without valvular abnormalities.   Past Medical History    Past Medical History:  Diagnosis Date   Anxiety    Breast cancer ([place])    Depression    Diabetes mellitus without complication ([place])    Thyroid nodule    Past Surgical History:  Procedure Laterality Date   BREAST RECONSTRUCTION WITH PLACEMENT OF TISSUE EXPANDER AND FLEX HD (ACELLULAR HYDRATED DERMIS) Bilateral 04/29/2020   Procedure: IMMEDIATE BILATERAL BREAST RECONSTRUCTION WITH PLACEMENT OF TISSUE EXPANDER AND FLEX HD (ACELLULAR HYDRATED DERMIS);  Surgeon: [name], MD;  Location: [facility];  Service: Plastics;  Laterality: Bilateral;   NIPPLE SPARING MASTECTOMY WITH SENTINEL LYMPH NODE BIOPSY Bilateral 04/29/2020   Procedure: BILATERAL NIPPLE SPARING MASTECTOMIES WITH RIGHT AXILLARY SENTINEL LYMPH NODE BIOPSY;  Surgeon: Rolm Bookbinder, MD;  Location: [place];  Service: General;  Laterality: Bilateral;  BILATERAL PEC BLOCK, RNFA   REMOVAL OF BILATERAL TISSUE EXPANDERS WITH PLACEMENT OF BILATERAL BREAST IMPLANTS  Bilateral 06/23/2020   Procedure: REMOVAL OF BILATERAL TISSUE EXPANDERS WITH PLACEMENT OF BILATERAL BREAST IMPLANTS;  Surgeon: [name], MD;  Location: [place];  Service: Plastics;  Laterality: Bilateral;  1.5 hours   TONSILLECTOMY      Allergies  Allergies  Allergen Reactions   Gabapentin Shortness Of Breath   Lyrica Cr [Pregabalin Er] Other (See Comments)    Unable to think clear.   Other Rash    Chlorahexadine wipe/soap    History of Present Illness    Mrs.Monce returns today to discuss test results from echocardiogram and Zio monitor after complaints of rapid HR and tachycardia on last office visit 07/15/2022.   Home Medications    Current Outpatient Medications  Medication Sig Dispense Refill   acarbose (PRECOSE) 25 MG tablet Take 25 mg by mouth daily.     acetaminophen (TYLENOL) 500 MG tablet Take 500 mg by mouth every 6 (six) hours as needed.     albuterol (VENTOLIN HFA) 108 (90 Base) MCG/ACT inhaler      benzonatate (TESSALON) 200 MG capsule Take 1 capsule (200 mg total) by mouth 3 (three) times daily as needed for cough. 40 capsule 3   budesonide-formoterol (SYMBICORT) 80-4.5 MCG/ACT inhaler Take 2 puffs first thing in am and then another 2 puffs about 12 hours later. 1 each 12   clonazePAM (KLONOPIN) 0.5 MG tablet Take 1 tablet (0.5 mg total) by mouth 2 (two) times daily as needed. 30 tablet 0   diphenhydrAMINE (BENADRYL) 25 MG tablet Take 25 mg by mouth at bedtime as needed.     DULoxetine (CYMBALTA) 20 MG capsule Take 1 capsule (20 mg total) by mouth daily. (  Patient not taking: Reported on 07/02/2022) 30 capsule 1   famotidine (PEPCID) 20 MG tablet One after supper 30 tablet 11   ferrous sulfate 325 (65 FE) MG tablet Take by mouth.     ibuprofen (ADVIL) 200 MG tablet Take 800 mg by mouth daily. Current dose     pantoprazole (PROTONIX) 40 MG tablet Take 1 tablet (40 mg total) by mouth daily. Take 30-60 min before first meal of the day 30 tablet 2    Prenatal Vit-Fe Fumarate-FA (PRENATAL VITAMIN PO) Take 1 tablet by mouth at bedtime.     sennosides-docusate sodium (SENOKOT-S) 8.6-50 MG tablet Take 3 tablets by mouth at bedtime.     UNABLE TO FIND daily. Med Name: vitamin b 3     UNABLE TO FIND Med Name: alka seltzer heartburn relief prn     No current facility-administered medications for this visit.     Family History    Family History  Problem Relation Age of Onset   Diabetes Mother    Heart failure Mother    Asthma Mother    Cancer Father 61       esophageal cancer   Diabetes Maternal Grandmother    Brain cancer Paternal Grandmother    Cancer Paternal Grandfather        esophageal cancer    She indicated that her mother is alive. She indicated that her father is deceased. She indicated that the status of her maternal grandmother is unknown. She indicated that her paternal grandmother is deceased. She indicated that her paternal grandfather is deceased.  Social History    Social History   Socioeconomic History   Marital status: Married    Spouse name: Not on file   Number of children: 4   Years of education: Not on file   Highest education level: Not on file  Occupational History   Not on file  Tobacco Use   Smoking status: Never   Smokeless tobacco: Never  Vaping Use   Vaping Use: Never used  Substance and Sexual Activity   Alcohol use: Yes    Comment: social    Drug use: Never   Sexual activity: Yes  Other Topics Concern   Not on file  Social History Narrative   Lives at home with husband and two children, two out of the house.   Social Determinants of Health   Financial Resource Strain: Not on file  Food Insecurity: Not on file  Transportation Needs: Not on file  Physical Activity: Not on file  Stress: Not on file  Social Connections: Not on file  Intimate Partner Violence: Not on file     Review of Systems    General:  No chills, fever, night sweats or weight changes.  Cardiovascular:  No  chest pain, dyspnea on exertion, edema, orthopnea, palpitations, paroxysmal nocturnal dyspnea. Dermatological: No rash, lesions/masses Respiratory: No cough, dyspnea Urologic: No hematuria, dysuria Abdominal:   No nausea, vomiting, diarrhea, bright red blood per rectum, melena, or hematemesis Neurologic:  No visual changes, wkns, changes in mental status. All other systems reviewed and are otherwise negative except as noted above.     Physical Exam    VS:  There were no vitals taken for this visit. , BMI There is no height or weight on file to calculate BMI.     GEN: Well nourished, well developed, in no acute distress. HEENT: normal. Neck: Supple, no JVD, carotid bruits, or masses. Cardiac: RRR, no murmurs, rubs, or gallops. No clubbing, cyanosis,  edema.  Radials/DP/PT 2+ and equal bilaterally.  Respiratory:  Respirations regular and unlabored, clear to auscultation bilaterally. GI: Soft, nontender, nondistended, BS + x 4. MS: no deformity or atrophy. Skin: warm and dry, no rash. Neuro:  Strength and sensation are intact. Psych: Normal affect.  Accessory Clinical Findings    ECG personally reviewed by me today- *** - No acute changes  Lab Results  Component Value Date   WBC 6.5 05/14/2022   HGB 11.5 05/14/2022   HCT 34.7 05/14/2022   MCV 94 05/14/2022   PLT 221 05/14/2022   Lab Results  Component Value Date   CREATININE 0.76 05/14/2022   BUN 17 05/14/2022   NA 143 05/14/2022   K 3.9 05/14/2022   CL 106 05/14/2022   CO2 21 05/14/2022   Lab Results  Component Value Date   ALT 15 01/07/2020   AST 16 01/07/2020   ALKPHOS 86 01/07/2020   BILITOT 0.5 01/07/2020   Lab Results  Component Value Date   CHOL 193 05/07/2022   HDL 72 (A) 05/07/2022   LDLCALC 110 05/07/2022   TRIG 58 05/07/2022    Lab Results  Component Value Date   HGBA1C 5.9 05/07/2022    Review of Prior Studies: Zio Monitor Study Highlights 07/15/2022  Rhythm was sinus Rare supraventricular  ectopy No sustained arrhythmias  Echocardiogram 07/16/2022 1. Left ventricular ejection fraction, by estimation, is 55 to 60%. Left  ventricular ejection fraction by 3D volume is 57 %. The left ventricle has  normal function. The left ventricle has no regional wall motion  abnormalities. Left ventricular diastolic   parameters are indeterminate.   2. Right ventricular systolic function is normal. The right ventricular  size is normal.   3. The mitral valve is normal in structure. No evidence of mitral valve  regurgitation. No evidence of mitral stenosis.   4. The aortic valve is normal in structure. Aortic valve regurgitation is  not visualized. No aortic stenosis is present.   5. The inferior vena cava is normal in size with greater than 50%  respiratory variability, suggesting right atrial pressure of 3 mmHg.   Assessment & Plan   1.  ***     {Are you ordering a CV Procedure (e.g. stress test, cath, DCCV, TEE, etc)?   Press F2        :[id number]   Signed, Phill Myron. West Pugh, ANP, [employer]   07/21/2022 12:59 PM      Office [phone] Fax [phone]  Notice: This dictation was prepared with Dragon dictation along with smaller phrase technology. Any transcriptional errors that result from this process are unintentional and may not be corrected upon review.

## 2022-07-22 ENCOUNTER — Other Ambulatory Visit: Payer: Self-pay

## 2022-07-22 ENCOUNTER — Telehealth: Payer: Self-pay

## 2022-07-22 NOTE — Telephone Encounter (Addendum)
Called patient regarding results. Patient had understanding of results.----- Message from Lendon Colonel, NP sent at 07/17/2022 10:17 AM EDT ----- I have reviewed the echocardiogram.  She has normal heart pumping function, no valvular abnormalities. Good report. Waiting on Zio monitor results.   KL

## 2022-07-22 NOTE — Telephone Encounter (Addendum)
Called patient regarding results. Patient had understanding of results.----- Message from Lendon Colonel, NP sent at 07/20/2022  3:02 PM EDT ----- I have reviewed the Zio monitor results. She is primarily in normal rhythm with rare elevated heart rates. This is reassuring concerning her overall heart rate and rhythm.  I will send let her know in My Chart  KL

## 2022-07-23 ENCOUNTER — Other Ambulatory Visit (HOSPITAL_COMMUNITY): Payer: Managed Care, Other (non HMO)

## 2022-07-23 ENCOUNTER — Ambulatory Visit: Payer: Managed Care, Other (non HMO) | Admitting: Adult Health

## 2022-08-21 ENCOUNTER — Other Ambulatory Visit: Payer: Self-pay | Admitting: Internal Medicine

## 2022-09-12 ENCOUNTER — Other Ambulatory Visit: Payer: Self-pay | Admitting: Internal Medicine

## 2022-09-12 ENCOUNTER — Other Ambulatory Visit: Payer: Self-pay | Admitting: Nurse Practitioner

## 2022-09-13 ENCOUNTER — Other Ambulatory Visit (HOSPITAL_COMMUNITY): Payer: Self-pay

## 2022-09-13 ENCOUNTER — Telehealth: Payer: Self-pay

## 2022-09-13 NOTE — Telephone Encounter (Signed)
PA request received via CMM for Budesonide-Formoterol Fumarate 80-4.5MCG/ACT aerosol  PA submitted to [employer] and APPROVED  from 08/14/2022-09/13/2023  Key: [facility]

## 2022-10-13 ENCOUNTER — Other Ambulatory Visit (HOSPITAL_COMMUNITY): Payer: Self-pay | Admitting: Family Medicine

## 2022-10-13 ENCOUNTER — Ambulatory Visit (HOSPITAL_COMMUNITY)
Admission: RE | Admit: 2022-10-13 | Discharge: 2022-10-13 | Disposition: A | Discharge status: Home / Self Care | Payer: Managed Care, Other (non HMO) | Source: Ambulatory Visit | Attending: Family Medicine | Admitting: Family Medicine

## 2022-10-13 DIAGNOSIS — R0789 Other chest pain: Secondary | ICD-10-CM | POA: Diagnosis present

## 2022-10-15 ENCOUNTER — Ambulatory Visit: Payer: Managed Care, Other (non HMO) | Admitting: "Endocrinology

## 2022-10-21 ENCOUNTER — Encounter: Payer: Self-pay | Admitting: Nurse Practitioner

## 2022-10-21 ENCOUNTER — Ambulatory Visit: Payer: Managed Care, Other (non HMO) | Admitting: Nurse Practitioner

## 2022-10-21 VITALS — BP 122/80 | HR 82 | Ht 65.0 in | Wt 148.0 lb

## 2022-10-21 DIAGNOSIS — R0609 Other forms of dyspnea: Secondary | ICD-10-CM | POA: Diagnosis not present

## 2022-10-21 DIAGNOSIS — J45991 Cough variant asthma: Secondary | ICD-10-CM

## 2022-10-21 DIAGNOSIS — R0781 Pleurodynia: Secondary | ICD-10-CM | POA: Diagnosis not present

## 2022-10-21 DIAGNOSIS — J4541 Moderate persistent asthma with (acute) exacerbation: Secondary | ICD-10-CM

## 2022-10-21 DIAGNOSIS — R490 Dysphonia: Secondary | ICD-10-CM

## 2022-10-21 DIAGNOSIS — J040 Acute laryngitis: Secondary | ICD-10-CM

## 2022-10-21 LAB — CBC WITH DIFFERENTIAL/PLATELET
Basophils Absolute: 0 10*3/uL (ref 0.0–0.1)
Basophils Relative: 0.1 % (ref 0.0–3.0)
Eosinophils Absolute: 0 10*3/uL (ref 0.0–0.7)
Eosinophils Relative: 0.3 % (ref 0.0–5.0)
HCT: 40 % (ref 36.0–46.0)
Hemoglobin: 13.1 g/dL (ref 12.0–15.0)
Lymphocytes Relative: 14.2 % (ref 12.0–46.0)
Lymphs Abs: 2.1 10*3/uL (ref 0.7–4.0)
MCHC: 32.7 g/dL (ref 30.0–36.0)
MCV: 93.2 fl (ref 78.0–100.0)
Monocytes Absolute: 1.2 10*3/uL — ABNORMAL HIGH (ref 0.1–1.0)
Monocytes Relative: 8.1 % (ref 3.0–12.0)
Neutro Abs: 11.4 10*3/uL — ABNORMAL HIGH (ref 1.4–7.7)
Neutrophils Relative %: 77.3 % — ABNORMAL HIGH (ref 43.0–77.0)
Platelets: 283 10*3/uL (ref 150.0–400.0)
RBC: 4.29 Mil/uL (ref 3.87–5.11)
RDW: 13.3 % (ref 11.5–15.5)
WBC: 14.8 10*3/uL — ABNORMAL HIGH (ref 4.0–10.5)

## 2022-10-21 LAB — POCT EXHALED NITRIC OXIDE: FeNO level (ppb): 15

## 2022-10-21 LAB — BASIC METABOLIC PANEL
BUN: 20 mg/dL (ref 6–23)
CO2: 26 mEq/L (ref 19–32)
Calcium: 10.5 mg/dL (ref 8.4–10.5)
Chloride: 100 mEq/L (ref 96–112)
Creatinine, Ser: 0.83 mg/dL (ref 0.40–1.20)
GFR: 83.21 mL/min (ref >60.00)
Glucose, Bld: 141 mg/dL — ABNORMAL HIGH (ref 70–99)
Potassium: 4 mEq/L (ref 3.5–5.1)
Sodium: 137 mEq/L (ref 135–145)

## 2022-10-21 LAB — D-DIMER, QUANTITATIVE: D-Dimer, Quant: <0.19 mcg/mL FEU (ref <0.50)

## 2022-10-21 MED ORDER — PROMETHAZINE-DM 6.25-15 MG/5ML PO SYRP
5.0000 mL | ORAL_SOLUTION | Freq: Four times a day (QID) | ORAL | 0 refills | Status: DC | PRN
Start: 2022-10-21 — End: 2023-01-07

## 2022-10-21 MED ORDER — DOXYCYCLINE HYCLATE 100 MG PO TABS
100.0000 mg | ORAL_TABLET | Freq: Two times a day (BID) | ORAL | 0 refills | Status: DC
Start: 2022-10-21 — End: 2022-11-19

## 2022-10-21 NOTE — Assessment & Plan Note (Addendum)
Unresolved acute bronchitis and upper airway flare. We will treat her with empiric doxycycline. She will complete prednisone taper. Hold on further steroid injection as exhaled nitric oxide testing is normal and lung exam clear. Initiate cough control measures and therapies to prevent further upper airway irritation. I am concerned for component of VCD/fixed upper airway obstruction. She needs to have PFTs once acute symptoms have improved. Will schedule at f/u. Action plan in place.  Patient Instructions  Continue Albuterol inhaler 2 puffs or 3 mL neb every 6 hours as needed for shortness of breath or wheezing. Notify if symptoms persist despite rescue inhaler/neb use. Use nebs 3-4 times a day until symptoms improve Continue Breztri 2 puffs Twice daily. Brush tongue and rinse mouth afterwards Continue benzonatate 1 capsule Three times a day for cough Continue protonix 1 tab daily for reflux  Doxycycline 1 tab Twice daily for 7 days. Take with food. Wear sunscreen while outside as this can increase your risk for sunburn.  Promethazine DM cough syrup 5 mL every 6 hours as needed for cough. Do not drive after taking. May cause drowsiness.  Complete prednisone taper.   Labs today  Pulmonary function testing   Follow up in 2 weeks with Dr. Sherene Sires or Katie Shawnell Dykes,NP. If symptoms do not improve or worsen, please contact office for sooner follow up or seek emergency care.

## 2022-10-21 NOTE — Patient Instructions (Addendum)
Continue Albuterol inhaler 2 puffs or 3 mL neb every 6 hours as needed for shortness of breath or wheezing. Notify if symptoms persist despite rescue inhaler/neb use. Use nebs 3-4 times a day until symptoms improve Continue Breztri 2 puffs Twice daily. Brush tongue and rinse mouth afterwards Continue benzonatate 1 capsule Three times a day for cough Continue protonix 1 tab daily for reflux  Doxycycline 1 tab Twice daily for 7 days. Take with food. Wear sunscreen while outside as this can increase your risk for sunburn.  Promethazine DM cough syrup 5 mL every 6 hours as needed for cough. Do not drive after taking. May cause drowsiness.  Complete prednisone taper.   Labs today  Pulmonary function testing   Follow up in 2 weeks with Dr. Sherene Sires or Katie Verenice Westrich,NP. If symptoms do not improve or worsen, please contact office for sooner follow up or seek emergency care.

## 2022-10-21 NOTE — Progress Notes (Signed)
D dimer was negative so very low possibility of blood clot, which is good news. Her white blood cells were elevated but this is likely due to steroids use. Will recheck after she is done with steroids for 4 weeks. Continue our plan as discussed earlier. Thanks.

## 2022-10-21 NOTE — Progress Notes (Signed)
@Patient  ID: Natalie Cardenas, female    DOB: 06/20/1975, 47 y.o.   MRN: [id number]  Chief Complaint  Patient presents with   Follow-up    Pt states she is sob has been ongoing for 2 weeks. Pt is on prednisone.     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 She is a patient of Dr. Thurston Hole and last seen in office 05/14/2022. Past medical history significant for DM II, right breast cancer, precordial chest pain, prediabetes, HLD.   TEST/EVENTS:  06/04/2022 Cardiac CT chest: lungs clear. Bilateral breast implants noted. No significant noncardiac findings. CADRADS 0 07/16/2022 echo: EF 55-60%, diastolic parameters indeterminate. RV size and function nl. No valvular abnormalities.  10/13/2022 CXR: no acute chest findings   05/14/2022: OV with Dr. Sherene Sires. Cough variant asthma, maintained on Symbicort 80 Twice daily. Not able to do regular walk/run and no longer doing steps. SOB ever since chest surgery for breast cancer. Shooting pains; transient and neither affected by deep breath or cough/exertion. Cough is better. Minimal SABA use.  Walked on RA; no desaturations or SOB. Some dizziness. Recommend exercise testing.   10/21/2022: Today - acute Patient presents today for acute visit. Two weeks ago, felt like she was getting chest heaviness and what she would describe as bronchitis. She went to her PCP and asked for a steroid. She has had two shots of depo and prednisone. She has been feeling more short winded too. She's only coughing when she talks or exerts herself. Chest didn't feel congested until last night but she couldn't get up any phlegm. She does feel like her chest feels heavy. She's had some dizziness and lightheadedness. Her voice is hoarse. She's currently on 30 mg of prednisone daily. She did complete a z pack yesterday. Rescue inhaler doesn't help much. Hasn't noticed a huge change with the  prednisone/z pack except for now she feels like she has phlegm in her chest whereas before, she didn't. She does have some discomfort to the middle of her chest, mainly with coughing. She has had chest pains intermittently for over a year now with normal cardiac workup. She was put on metoprolol for tachycardia. She denies any syncope, palpitations, leg swelling, fevers, chills, hemoptysis, leg swelling, calf pain, anorexia. She does feel fatigued. Her PCP gave her a sample of Breztri to use instead of her Symbicort. She's not sure she's noticed any difference. Her neb treatments do help some. She is using tessalon perles for the cough.   Allergies  Allergen Reactions   Gabapentin Shortness Of Breath   Lyrica Cr [Pregabalin Er] Other (See Comments)    Unable to think clear.   Other Rash    Chlorahexadine wipe/soap    Immunization History  Administered Date(s) Administered   Influenza,inj,quad, With Preservative 02/25/2012, 02/02/2019   Influenza-Unspecified 02/25/2012   PFIZER(Purple Top)SARS-COV-2 Vaccination 05/16/2019, 06/06/2019    Past Medical History:  Diagnosis Date   Anxiety    Breast cancer (HCC)    Depression    Diabetes mellitus without complication (HCC)    Thyroid nodule     Tobacco History: Social History   Tobacco Use  Smoking Status Never  Smokeless Tobacco Never   Counseling given: Not Answered   Outpatient Medications Prior to Visit  Medication Sig Dispense Refill   acarbose (PRECOSE) 25 MG tablet Take 25 mg by mouth daily.     acetaminophen (TYLENOL) 500 MG tablet  Take 500 mg by mouth every 6 (six) hours as needed.     albuterol (VENTOLIN HFA) 108 (90 Base) MCG/ACT inhaler      benzonatate (TESSALON) 200 MG capsule Take 1 capsule (200 mg total) by mouth 3 (three) times daily as needed for cough. 40 capsule 3   BREZTRI AEROSPHERE 160-9-4.8 MCG/ACT AERO Inhale 1 puff into the lungs in the morning and at bedtime.     clonazePAM (KLONOPIN) 0.5 MG tablet Take  1 tablet (0.5 mg total) by mouth 2 (two) times daily as needed. 30 tablet 0   diphenhydrAMINE (BENADRYL) 25 MG tablet Take 25 mg by mouth at bedtime as needed.     DULoxetine (CYMBALTA) 20 MG capsule TAKE 1 CAPSULE BY MOUTH EVERY DAY 30 capsule 1   famotidine (PEPCID) 20 MG tablet One after supper 30 tablet 11   ferrous sulfate 325 (65 FE) MG tablet Take by mouth.     ibuprofen (ADVIL) 200 MG tablet Take 800 mg by mouth daily. Current dose     pantoprazole (PROTONIX) 40 MG tablet TAKE 1 TABLET (40 MG TOTAL) BY MOUTH DAILY. TAKE 30-60 MIN BEFORE FIRST MEAL OF THE DAY 90 tablet 1   Prenatal Vit-Fe Fumarate-FA (PRENATAL VITAMIN PO) Take 1 tablet by mouth at bedtime.     sennosides-docusate sodium (SENOKOT-S) 8.6-50 MG tablet Take 3 tablets by mouth at bedtime.     UNABLE TO FIND daily. Med Name: vitamin b 3     UNABLE TO FIND Med Name: alka seltzer heartburn relief prn     budesonide-formoterol (SYMBICORT) 80-4.5 MCG/ACT inhaler Take 2 puffs first thing in am and then another 2 puffs about 12 hours later. (Patient not taking: Reported on 10/21/2022) 1 each 12   No facility-administered medications prior to visit.     Review of Systems:   Constitutional: No weight loss or gain, night sweats, fevers, chills, or lassitude. +fatigue  HEENT: No headaches, difficulty swallowing, tooth/dental problems, or sore throat. No sneezing, itching, ear ache, nasal congestion, or post nasal drip. +voice hoarseness  CV:  +intermittent chest pains (baseline); dizziness. No orthopnea, PND, swelling in lower extremities, anasarca, palpitations, syncope Resp: +shortness of breath with exertion; congested cough; occasional wheeze; chest tightness. No hemoptysis. No chest wall deformity GI:  No heartburn, indigestion GU: No dysuria, change in color of urine, urgency or frequency.   Skin: No rash, lesions, ulcerations MSK:  No joint pain or swelling.   Neuro: No memory impairment. No gait abnormalities.  Psych: No  depression or anxiety. Mood stable.     Physical Exam:  BP 122/80   Pulse 82   Ht 5\' 5"  (1.651 m)   Wt 148 lb (67.1 kg)   SpO2 99%   BMI 24.63 kg/m   GEN: Pleasant, interactive, well-kempt; in no acute distress. HEENT:  Normocephalic and atraumatic. EACs patent bilaterally. TM pearly gray with present light reflex bilaterally. PERRLA. Sclera white. Nasal turbinates pink, moist and patent bilaterally. No rhinorrhea present. Oropharynx pink and moist, without exudate or edema. No lesions, ulcerations, or postnasal drip.  NECK:  Supple w/ fair ROM. No JVD present. Normal carotid impulses w/o bruits. Thyroid symmetrical with no goiter or nodules palpated. No lymphadenopathy.   CV: RRR, no m/r/g, no peripheral edema. Pulses intact, +2 bilaterally. No cyanosis, pallor or clubbing. PULMONARY:  Unlabored, regular breathing. Clear bilaterally A&P w/o wheezes/rales/rhonchi. Bronchitic cough. No accessory muscle use.  GI: BS present and normoactive. Soft, non-tender to palpation. No organomegaly or masses detected.  MSK: No erythema, warmth or tenderness. Cap refil <2 sec all extrem. No deformities or joint swelling noted.  Neuro: A/Ox3. No focal deficits noted.   Skin: Warm, no lesions or rashe Psych: Normal affect and behavior. Judgement and thought content appropriate.     Lab Results:  CBC    Component Value Date/Time   WBC 14.8 (H) 10/21/2022 1357   RBC 4.29 10/21/2022 1357   HGB 13.1 10/21/2022 1357   HGB 11.5 05/14/2022 1615   HCT 40.0 10/21/2022 1357   HCT 34.7 05/14/2022 1615   PLT 283.0 10/21/2022 1357   PLT 221 05/14/2022 1615   MCV 93.2 10/21/2022 1357   MCV 94 05/14/2022 1615   MCH 31.0 05/14/2022 1615   MCH 31.4 01/07/2020 1619   MCHC 32.7 10/21/2022 1357   RDW 13.3 10/21/2022 1357   RDW 12.3 05/14/2022 1615   LYMPHSABS 2.1 10/21/2022 1357   LYMPHSABS 2.2 05/14/2022 1615   MONOABS 1.2 (H) 10/21/2022 1357   EOSABS 0.0 10/21/2022 1357   EOSABS 0.5 (H) 05/14/2022  1615   BASOSABS 0.0 10/21/2022 1357   BASOSABS 0.1 05/14/2022 1615    BMET    Component Value Date/Time   NA 137 10/21/2022 1357   NA 143 05/14/2022 1615   K 4.0 10/21/2022 1357   CL 100 10/21/2022 1357   CO2 26 10/21/2022 1357   GLUCOSE 141 (H) 10/21/2022 1357   BUN 20 10/21/2022 1357   BUN 17 05/14/2022 1615   CREATININE 0.83 10/21/2022 1357   CREATININE 0.73 01/07/2020 1619   CALCIUM 10.5 10/21/2022 1357   GFRNONAA >60 06/18/2020 1455   GFRNONAA >60 01/07/2020 1619   GFRAA >60 01/07/2020 1619    BNP    Component Value Date/Time   BNP 7.8 05/14/2022 1615     Imaging:  DG Chest 2 View  Result Date: 10/17/2022 CLINICAL DATA:  Chest pain.  Shortness of breath. EXAM: CHEST - 2 VIEW COMPARISON:  Radiograph 04/09/2022 FINDINGS: The cardiomediastinal contours are normal. The lungs are clear. Pulmonary vasculature is normal. No consolidation, pleural effusion, or pneumothorax. No acute osseous abnormalities are seen. Left axillary surgical clips. IMPRESSION: No acute chest findings or explanation for symptoms. Electronically Signed   By: Narda Rutherford M.D.   On: 10/17/2022 16:23          No data to display          No results found for: "NITRICOXIDE"      Assessment & Plan:   Cough variant asthma vs UACS Unresolved acute bronchitis and upper airway flare. We will treat her with empiric doxycycline. She will complete prednisone taper. Hold on further steroid injection as exhaled nitric oxide testing is normal and lung exam clear. Initiate cough control measures and therapies to prevent further upper airway irritation. I am concerned for component of VCD/fixed upper airway obstruction. She needs to have PFTs once acute symptoms have improved. Will schedule at f/u. Action plan in place.  Patient Instructions  Continue Albuterol inhaler 2 puffs or 3 mL neb every 6 hours as needed for shortness of breath or wheezing. Notify if symptoms persist despite rescue  inhaler/neb use. Use nebs 3-4 times a day until symptoms improve Continue Breztri 2 puffs Twice daily. Brush tongue and rinse mouth afterwards Continue benzonatate 1 capsule Three times a day for cough Continue protonix 1 tab daily for reflux  Doxycycline 1 tab Twice daily for 7 days. Take with food. Wear sunscreen while outside as this can increase your risk for  sunburn.  Promethazine DM cough syrup 5 mL every 6 hours as needed for cough. Do not drive after taking. May cause drowsiness.  Complete prednisone taper.   Labs today  Pulmonary function testing   Follow up in 2 weeks with Dr. Sherene Sires or Katie Lilton Pare,NP. If symptoms do not improve or worsen, please contact office for sooner follow up or seek emergency care.    Laryngitis See above.   I spent 35 minutes of dedicated to the care of this patient on the date of this encounter to include pre-visit review of records, face-to-face time with the patient discussing conditions above, post visit ordering of testing, clinical documentation with the electronic health record, making appropriate referrals as documented, and communicating necessary findings to members of the patients care team.  Noemi Chapel, NP 10/22/2022  Pt aware and understands NP's role.

## 2022-10-22 ENCOUNTER — Other Ambulatory Visit: Payer: Self-pay | Admitting: Plastic Surgery

## 2022-10-22 ENCOUNTER — Encounter: Payer: Self-pay | Admitting: Nurse Practitioner

## 2022-10-22 DIAGNOSIS — J040 Acute laryngitis: Secondary | ICD-10-CM | POA: Insufficient documentation

## 2022-10-22 DIAGNOSIS — T8543XA Leakage of breast prosthesis and implant, initial encounter: Secondary | ICD-10-CM

## 2022-10-22 DIAGNOSIS — R490 Dysphonia: Secondary | ICD-10-CM | POA: Insufficient documentation

## 2022-10-22 NOTE — Assessment & Plan Note (Signed)
See above

## 2022-11-04 ENCOUNTER — Telehealth: Payer: Self-pay | Admitting: Internal Medicine

## 2022-11-04 NOTE — Telephone Encounter (Signed)
Pt calling bc she has been struggling to breathe and wants to have her PFT done ASAP. Pt has active request for PFT already. Wants to try to get it done at the hospital instead to have it done before her OV on 11/19/2022.

## 2022-11-04 NOTE — Telephone Encounter (Signed)
I have left respiratory a vm to see about getting the pft moved up I will forward this message to triage since its for breathing issues

## 2022-11-05 ENCOUNTER — Ambulatory Visit: Payer: Managed Care, Other (non HMO)

## 2022-11-12 ENCOUNTER — Ambulatory Visit: Payer: Managed Care, Other (non HMO) | Attending: General Surgery | Admitting: Physical Therapy

## 2022-11-12 DIAGNOSIS — I972 Postmastectomy lymphedema syndrome: Secondary | ICD-10-CM | POA: Insufficient documentation

## 2022-11-12 NOTE — Therapy (Signed)
  OUTPATIENT PHYSICAL THERAPY SOZO SCREENING NOTE   Patient Name: Natalie Cardenas MRN: [id number] DOB:[birth year], 47 y.o., female Today's Date: 11/12/2022  PCP: Avis Epley, PA-C REFERRING PROVIDER: Emelia Loron, MD   PT End of Session - 11/12/22 1058     Visit Number 1   unchanged screen only   Number of Visits 1    PT Start Time 1038    PT Stop Time 1040    PT Time Calculation (min) 2 min    Activity Tolerance Patient tolerated treatment well    Behavior During Therapy WFL for tasks assessed/performed             Past Medical History:  Diagnosis Date   Anxiety    Breast cancer (HCC)    Depression    Diabetes mellitus without complication (HCC)    Thyroid nodule    Past Surgical History:  Procedure Laterality Date   BREAST RECONSTRUCTION WITH PLACEMENT OF TISSUE EXPANDER AND FLEX HD (ACELLULAR HYDRATED DERMIS) Bilateral 04/29/2020   Procedure: IMMEDIATE BILATERAL BREAST RECONSTRUCTION WITH PLACEMENT OF TISSUE EXPANDER AND FLEX HD (ACELLULAR HYDRATED DERMIS);  Surgeon: Allena Napoleon, MD;  Location: [facility];  Service: Plastics;  Laterality: Bilateral;   NIPPLE SPARING MASTECTOMY WITH SENTINEL LYMPH NODE BIOPSY Bilateral 04/29/2020   Procedure: BILATERAL NIPPLE SPARING MASTECTOMIES WITH RIGHT AXILLARY SENTINEL LYMPH NODE BIOPSY;  Surgeon: Emelia Loron, MD;  Location: [facility];  Service: General;  Laterality: Bilateral;  BILATERAL PEC BLOCK, RNFA   REMOVAL OF BILATERAL TISSUE EXPANDERS WITH PLACEMENT OF BILATERAL BREAST IMPLANTS Bilateral 06/23/2020   Procedure: REMOVAL OF BILATERAL TISSUE EXPANDERS WITH PLACEMENT OF BILATERAL BREAST IMPLANTS;  Surgeon: Allena Napoleon, MD;  Location:  SURGERY CENTER;  Service: Plastics;  Laterality: Bilateral;  1.5 hours   TONSILLECTOMY     Patient Active Problem List   Diagnosis Date Noted   Laryngitis 10/22/2022   Prediabetes 07/02/2022   Reactive hypoglycemia 07/02/2022   Mixed  hyperlipidemia 07/02/2022   Precordial chest pain 05/20/2022   Type 2 diabetes mellitus with complication, without long-term current use of insulin (HCC) 05/20/2022   DOE (dyspnea on exertion) 05/14/2022   Cough variant asthma vs UACS 02/19/2022   Cervical radiculopathy at C6 03/12/2021   Breast cancer (HCC) 04/29/2020   Genetic testing 01/14/2020   Ductal carcinoma in situ (DCIS) of right breast 01/02/2020    REFERRING DIAG: right breast cancer at risk for lymphedema  THERAPY DIAG:  Postmastectomy lymphedema  PERTINENT HISTORY:  bilateral mastectomy in Apr 29 2020 with removal 2 nodes on the right with immediate exanders. Expanders were removed and implants placed Feb 28. Pt did not have chemo or radiation.   PRECAUTIONS: right UE Lymphedema risk,   SUBJECTIVE: Here for SOZO screen.   PAIN:  Are you having pain? No  SOZO SCREENING: Patient was assessed today using the SOZO machine to determine the lymphedema index score. This was compared to her baseline score. It was determined that she is within the recommended range when compared to her baseline and no further action is needed at this time. She will continue SOZO screenings. These are done every 3 months for 2 years post operatively followed by every 6 months for 2 years, and then annually.    [employer], PT 11/12/2022, 11:01 AM

## 2022-11-12 NOTE — Telephone Encounter (Signed)
No I havent but I will call again

## 2022-11-12 NOTE — Telephone Encounter (Signed)
Any updates on this?

## 2022-11-17 NOTE — Telephone Encounter (Signed)
I finally got through to the hosp and got patient scheduled for 11/18/22 @ 2 at [facility] for patient

## 2022-11-17 NOTE — Telephone Encounter (Signed)
Patient called back stated she would be at the appt

## 2022-11-17 NOTE — Telephone Encounter (Signed)
Patient still has not been scheduled for PFT at hospital.  Next PFT office schedule at this time is 02/04/2023.    PCC's any updates on hospital PFT schedules

## 2022-11-18 ENCOUNTER — Ambulatory Visit (HOSPITAL_COMMUNITY)
Admission: RE | Admit: 2022-11-18 | Discharge: 2022-11-18 | Disposition: A | Discharge status: Home / Self Care | Payer: Managed Care, Other (non HMO) | Source: Ambulatory Visit | Attending: Nurse Practitioner | Admitting: Nurse Practitioner

## 2022-11-18 DIAGNOSIS — J45991 Cough variant asthma: Secondary | ICD-10-CM | POA: Insufficient documentation

## 2022-11-18 DIAGNOSIS — R0609 Other forms of dyspnea: Secondary | ICD-10-CM | POA: Diagnosis present

## 2022-11-18 LAB — PULMONARY FUNCTION TEST
DL/VA % pred: 102 %
DL/VA: 4.43 ml/min/mmHg/L
DLCO unc % pred: 88 %
DLCO unc: 19.37 ml/min/mmHg
FEF 25-75 Post: 1.12 L/sec
FEF 25-75 Pre: 2.45 L/sec
FEF2575-%Change-Post: -54 %
FEF2575-%Pred-Post: 38 %
FEF2575-%Pred-Pre: 84 %
FEV1-%Change-Post: -13 %
FEV1-%Pred-Post: 73 %
FEV1-%Pred-Pre: 85 %
FEV1-Post: 2.17 L
FEV1-Pre: 2.52 L
FEV1FVC-%Change-Post: -17 %
FEV1FVC-%Pred-Pre: 98 %
FEV6-%Change-Post: 0 %
FEV6-%Pred-Post: 86 %
FEV6-%Pred-Pre: 87 %
FEV6-Post: 3.12 L
FEV6-Pre: 3.15 L
FEV6FVC-%Change-Post: -6 %
FEV6FVC-%Pred-Post: 95 %
FEV6FVC-%Pred-Pre: 102 %
FVC-%Change-Post: 5 %
FVC-%Pred-Post: 90 %
FVC-%Pred-Pre: 85 %
FVC-Post: 3.34 L
FVC-Pre: 3.18 L
Post FEV1/FVC ratio: 65 %
Post FEV6/FVC ratio: 93 %
Pre FEV1/FVC ratio: 79 %
Pre FEV6/FVC Ratio: 100 %
RV % pred: 76 %
RV: 1.38 L
TLC % pred: 88 %
TLC: 4.59 L

## 2022-11-18 MED ORDER — ALBUTEROL SULFATE (2.5 MG/3ML) 0.083% IN NEBU
2.5000 mg | INHALATION_SOLUTION | Freq: Once | RESPIRATORY_TRACT | Status: AC
  Administered 2022-11-18: 2.5 mg via RESPIRATORY_TRACT

## 2022-11-18 NOTE — Progress Notes (Unsigned)
Natalie Cardenas, female    DOB: 25-Mar-1976    MRN: [id number]   Brief patient profile:  61  yowf never smoker with allergies as child spring = fall evolved into perrenial pattern around 2018  before moving from [place] to [place] in 2019  and new house 2020 and the same job 2007 / works as Tax inspector where referred to pulmonary clinic in [place]  02/19/2022 by Terie Purser for cough > sob    Bad bronchitis  2019 before moved into new house assoc sob x months rx prednisone plus prn saba and dulera 200.  S/p bilateral mastectomy for DCIS /reconstruction 2021- 2022 with neuropathic pain post op  History of Present Illness  02/19/2022  Pulmonary/ 1st office eval/ Natalie Cardenas / [facility]  Chief Complaint  Patient presents with   Consult    SOB with sharp chest pain. States she clears her throat a lot and SOB has worsened over last few months   Dyspnea:  as an adult always has had doe compared to peers Cough: much worse since covid Jan 30 2022  whereas urge to clear throat and variable tightness in throat / gen ant cp were present x years prior  Sleep: does not wake her up  SABA use: avg once a day at lunch  Depomedrol shots helped  Rec Prednisone 10 mg take  4 each am x 2 days,   2 each am x 2 days,  1 each am x 2 days and stop  For cough > tessalon 200 mg  up to every 4 - 6 hours Plan A = Automatic = Always=   Symbicort 160 dropping down 80 2puffs every 12 hours Work on inhaler technique:   Plan B = Backup (to supplement plan A, not to replace it) Only use your albuterol inhaler as a rescue medication GERD diet reviewed, bed blocks rec   Please schedule a follow up office visit in 6 weeks, call sooner if needed - bring inhalers    04/02/2022  f/u ov/[place] office/Natalie Cardenas re: cough/chest tight  maint on symb 160 which "burns throat" and lots of mint lozenges/ alkaseltzer for gerd   Chief Complaint  Patient presents with   Follow-up    Still having cough and chest tightness/  pain   Dyspnea:  for years does run/walk  - 30 min did fine unless more walk than run does not provoke chest tightness or cough  Cough: dry/ throat clearing / sporadic/ has tessalon which helps but not using  Sleeping: ok flat SABA use: once a week  02: none  Covid status: vax x 3  Rec Pantoprazole (protonix) 40 mg   Take  30-60 min before first meal of the day and Pepcid (famotidine)  20 mg after supper until return to office   For cough > tessalon 200 up to 3-4 x per day GERD diet reviewed, bed blocks rec  Reduce symbicort 80 Take 2 puffs first thing in am and then another 2 puffs about 12 hours later.  Only use your albuterol as a rescue medication  Also  Ok to try albuterol 15 min before an activity (on alternating days)  that you know would usually make you short of breath     05/14/2022  f/u ov/[place] office/Natalie Cardenas re: cough variant asthma maint on symb 80 2bid   Chief Complaint  Patient presents with   Follow-up    Pt f/u she is still concerned about lingering chest pain/tightness also SOB. Reports having some tremors  but has appt to establish care w/ endocrinologist  Dyspnea:  not able able to do regular walk/run and no longer doing steps s sob x ever since chest surgery with assoc  chest tightness is described as constant daytimes with occ  shooting pains that are transient and neither affected by deep breath or cough or exertion. Cough: better to her satisfaction Sleeping: does fine once supine and asleep s chest tight ness  SABA use: very little 02: none  Covid status: vax x 3  Rec CPST is the next step -  CPpattern suggests IBS    Treatment consists of avoiding foods that cause gas (especially boiled eggs, mexcican food but especially  beans and undercooked vegetables like  spinach and some salads)  and citrucel 1 heaping tsp twice daily with a large glass of water.  Pain should improve w/in 2 weeks and if not then consider further GI work up.       11/19/2022  f/u  ov/[place] office/Natalie Cardenas re: cough x Oct 2023 maint on symbicort 65 2bid    Chief Complaint  Patient presents with   Follow-up    Breathing has been some better, but not back to her baseline. She is coughing a lot- non prod. Her voice is hoarse. Cough is worse with exertion and talking. She is coughing to the point loses urinary continence.    ? Any better with prednisone > best rx in prednisone  Dyspnea:  no change  Cough: dry hack / worse with  Sleeping: once asleep does fine  SABA use: neb helps  02: none  No obvious day to day or daytime variability or assoc excess/ purulent sputum or mucus plugs or hemoptysis or cp or chest tightness, subjective wheeze or overt sinus or hb symptoms.   Sleeping  without nocturnal  or early am exacerbation  of respiratory  c/o's or need for noct saba. Also denies any obvious fluctuation of symptoms with weather or environmental changes or other aggravating or alleviating factors except as outlined above   No unusual exposure hx or h/o childhood pna/ asthma or knowledge of premature birth.  Current Allergies, Complete Past Medical History, Past Surgical History, Family History, and Social History were reviewed in [employer] record.  ROS  The following are not active complaints unless bolded Hoarseness, sore throat, dysphagia, dental problems, itching, sneezing,  nasal congestion or discharge of excess mucus or purulent secretions, ear ache,   fever, chills, sweats, unintended wt loss or wt gain, classically pleuritic or exertional cp,  orthopnea pnd or arm/hand swelling  or leg swelling, presyncope, palpitations, abdominal pain, anorexia, nausea, vomiting, diarrhea  or change in bowel habits or change in bladder habits, change in stools or change in urine, dysuria, hematuria,  rash, arthralgias, visual complaints, headache, numbness, weakness or ataxia or problems with walking or coordination,  change in mood or  memory. New onset  muscle cramps        Current Meds  Medication Sig   acarbose (PRECOSE) 25 MG tablet Take 25 mg by mouth daily as needed.   acetaminophen (TYLENOL) 500 MG tablet Take 500 mg by mouth every 6 (six) hours as needed.   albuterol (VENTOLIN HFA) 108 (90 Base) MCG/ACT inhaler    benzonatate (TESSALON) 200 MG capsule Take 1 capsule (200 mg total) by mouth 3 (three) times daily as needed for cough.   budesonide-formoterol (SYMBICORT) 80-4.5 MCG/ACT inhaler Take 2 puffs first thing in am and then another 2 puffs about 12  hours later.   clonazePAM (KLONOPIN) 0.5 MG tablet Take 1 tablet (0.5 mg total) by mouth 2 (two) times daily as needed.   famotidine (PEPCID) 20 MG tablet One after supper   ferrous sulfate 325 (65 FE) MG tablet Take by mouth.   ibuprofen (ADVIL) 200 MG tablet Take 800 mg by mouth daily. Current dose   pantoprazole (PROTONIX) 40 MG tablet TAKE 1 TABLET (40 MG TOTAL) BY MOUTH DAILY. TAKE 30-60 MIN BEFORE FIRST MEAL OF THE DAY   Prenatal Vit-Fe Fumarate-FA (PRENATAL VITAMIN PO) Take 1 tablet by mouth at bedtime.   promethazine-dextromethorphan (PROMETHAZINE-DM) 6.25-15 MG/5ML syrup Take 5 mLs by mouth 4 (four) times daily as needed.   sennosides-docusate sodium (SENOKOT-S) 8.6-50 MG tablet Take 3 tablets by mouth at bedtime.   UNABLE TO FIND daily. Med Name: vitamin b 3   UNABLE TO FIND Med Name: alka seltzer heartburn relief prn                 Past Medical History:  Diagnosis Date   Anxiety    Breast cancer (HCC)    Depression    Diabetes mellitus without complication (HCC)    Thyroid nodule        Objective:    Wts  11/19/2022        153  05/14/2022       152   04/02/22 153 lb (69.4 kg)  02/19/22 149 lb 12.8 oz (67.9 kg)  03/24/21 148 lb 6 oz (67.3 kg)    Vital signs reviewed  11/19/2022  - Note at rest 02 sats  97% on RA   General appearance:    amb somber wf/ hoarse with harsh cough on insp     Nl exam       Assessment

## 2022-11-19 ENCOUNTER — Ambulatory Visit
Admission: RE | Admit: 2022-11-19 | Discharge: 2022-11-19 | Disposition: A | Discharge status: Home / Self Care | Payer: Managed Care, Other (non HMO) | Source: Ambulatory Visit | Attending: Plastic Surgery

## 2022-11-19 ENCOUNTER — Ambulatory Visit: Payer: Managed Care, Other (non HMO) | Admitting: Internal Medicine

## 2022-11-19 ENCOUNTER — Encounter: Payer: Self-pay | Admitting: Internal Medicine

## 2022-11-19 ENCOUNTER — Ambulatory Visit
Admission: RE | Admit: 2022-11-19 | Discharge: 2022-11-19 | Disposition: A | Discharge status: Home / Self Care | Payer: Managed Care, Other (non HMO) | Source: Ambulatory Visit | Attending: Plastic Surgery | Admitting: Plastic Surgery

## 2022-11-19 VITALS — BP 124/78 | HR 96 | Ht 65.0 in | Wt 153.0 lb

## 2022-11-19 DIAGNOSIS — T8543XA Leakage of breast prosthesis and implant, initial encounter: Secondary | ICD-10-CM

## 2022-11-19 DIAGNOSIS — J45991 Cough variant asthma: Secondary | ICD-10-CM | POA: Diagnosis not present

## 2022-11-19 NOTE — Patient Instructions (Addendum)
Diagnosis is irritable larynx syndrome so you can stop symbicort and try just saline solution for nebulizer as needed   Best rx is start gabapentin 100 mg at bed time and gradually over sever weeks build up to maximum dose is 300 mg three times daily = 900 mg take with bfast, supper, bedtime but would only use it if you previously started a higher dose.  Please remember to go to the lab department   for your tests - we will call you with the results when they are available to direct you to the next specialist depending on the results

## 2022-11-20 MED ORDER — PREDNISONE 10 MG PO TABS: ORAL_TABLET | ORAL | 0 refills | Status: DC

## 2022-11-20 NOTE — Assessment & Plan Note (Addendum)
Seasonal rhinitis dating back to childhood then cough x 2019 while in [place] with freq throat clearing and best rx is prednisone but never 100%  - 02/19/2022    try symbicort 160 2bid until use up then 80 2bid and max gerd rx > did not do the latter 2  recs and continued to cp/throat irritation 04/02/2022  - FEN0  02/19/2022  = 13 while on asthmanex 200 2bid  - 04/02/2022   so try symbicort 80 2bid, off all mints and suppress cough with tessalon  - FENO 05/14/2022  = 10 on symb 80 2bid  - 05/14/2022  After extensive coaching inhaler device,  effectiveness =    90%  - 05/14/2022   Walked on RA  x  3  lap(s) =  approx 450  ft  @ brisk pace, stopped due to end of study  with lowest 02 sats 93% and a bit dizzy but no sob   - PFT's  11/18/22  baseline wnl then much worse p rx with SABA so rec d/c symb/saba and just use Saline in neb prn   - Allergy screen 11/19/2022 >  Eos 0. 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   These are the same pts (now being labeled as having "irritable larynx syndrome" by some cough centers) who not infrequently have a history of having failed to tolerate ace inhibitors,  dry powder inhalers or biphosphonates or report having atypical/extraesophageal reflux symptoms that don't respond to standard doses of PPI  and are easily confused as having aecopd or asthma flares by even experienced allergists/ pulmonologists (myself included).   Of the three most common causes of  Sub-acute / recurrent or  chronic cough, only one (GERD)  can actually contribute to/ trigger  the other two (asthma and post nasal drip syndrome)  and perpetuate the cylce of cough.  While not intuitively obvious, many patients with chronic low grade reflux do not cough until there is a primary insult that disturbs the protective epithelial barrier and exposes sensitive nerve endings.   This is typically viral but can due to PNDS and  either may apply here.   The point is that once this occurs, it is difficult to eliminate the cycle  using anything but a maximally effective acid suppression regimen at least in the short run, accompanied by an appropriate diet to address non acid GERD and control / eliminate the cough itself with gabapentin if tol starting at just 100 mg daily >>> also  added 6 day taper off  Prednisone starting at 40 mg per day in case of component of Th-2 driven upper or lower airways inflammation (if cough responds short term only to relapse before return while will on full rx for uacs (as above), then  that would point to allergic rhinitis/ asthma or eos bronchitis as alternative dx)   If allergy screen pos > allergy eval , if not > ENT at the new [facility] office strongly rec  Ok off symbicort as just makes her cough as does albuterol  - thinks the Saline in neb helps so ok to try.         Each maintenance medication was reviewed in detail including emphasizing most importantly the difference between maintenance and prns and under what circumstances the prns are to be triggered using an action plan format where appropriate.  Total time for H and P, chart review, counseling,  and generating customized AVS unique to this office visit / same day charting > 30 min for multiple  refractory respiratory  symptoms of uncertain etiology

## 2022-11-22 ENCOUNTER — Telehealth: Payer: Self-pay | Admitting: Internal Medicine

## 2022-11-22 NOTE — Telephone Encounter (Signed)
[facility] MyChart message also sent.

## 2022-11-22 NOTE — Telephone Encounter (Signed)
Patient had additional questions regarding lab work. Patient would like to know if all other labs besides allergy panel were in normal levels. Please call back at [phone].

## 2022-11-30 ENCOUNTER — Telehealth: Payer: Self-pay | Admitting: "Endocrinology

## 2022-11-30 NOTE — Telephone Encounter (Signed)
Pt said she was on steroids more than 30 days and asked if this would effect her blood work for her appt coming up. She will have been off of them for 3 weeks by the time she does her labs. Please Advise

## 2022-12-01 ENCOUNTER — Encounter: Payer: Self-pay | Admitting: Gastroenterology

## 2022-12-10 ENCOUNTER — Ambulatory Visit: Payer: Managed Care, Other (non HMO) | Admitting: Internal Medicine

## 2022-12-11 LAB — COMPREHENSIVE METABOLIC PANEL
Chloride: 104 mmol/L (ref 96–106)
Sodium: 141 mmol/L (ref 134–144)
Total Protein: 6.6 g/dL (ref 6.0–8.5)
eGFR: 94 mL/min/{1.73_m2} (ref >59)

## 2022-12-17 ENCOUNTER — Ambulatory Visit: Payer: Managed Care, Other (non HMO) | Admitting: Gastroenterology

## 2022-12-17 ENCOUNTER — Encounter: Payer: Self-pay | Admitting: Gastroenterology

## 2022-12-17 ENCOUNTER — Encounter: Payer: Self-pay | Admitting: "Endocrinology

## 2022-12-17 ENCOUNTER — Ambulatory Visit (INDEPENDENT_AMBULATORY_CARE_PROVIDER_SITE_OTHER): Payer: Managed Care, Other (non HMO) | Admitting: "Endocrinology

## 2022-12-17 ENCOUNTER — Institutional Professional Consult (permissible substitution): Payer: Managed Care, Other (non HMO) | Admitting: Plastic Surgery

## 2022-12-17 VITALS — BP 138/83 | HR 89 | Temp 97.4°F | Ht 65.0 in | Wt 157.8 lb

## 2022-12-17 VITALS — BP 112/72 | HR 64 | Ht 65.0 in | Wt 157.0 lb

## 2022-12-17 DIAGNOSIS — R0989 Other specified symptoms and signs involving the circulatory and respiratory systems: Secondary | ICD-10-CM | POA: Diagnosis not present

## 2022-12-17 DIAGNOSIS — E161 Other hypoglycemia: Secondary | ICD-10-CM | POA: Diagnosis not present

## 2022-12-17 DIAGNOSIS — R7303 Prediabetes: Secondary | ICD-10-CM | POA: Diagnosis not present

## 2022-12-17 DIAGNOSIS — K219 Gastro-esophageal reflux disease without esophagitis: Secondary | ICD-10-CM

## 2022-12-17 DIAGNOSIS — K5904 Chronic idiopathic constipation: Secondary | ICD-10-CM

## 2022-12-17 DIAGNOSIS — Z800 Family history of malignant neoplasm of digestive organs: Secondary | ICD-10-CM | POA: Insufficient documentation

## 2022-12-17 DIAGNOSIS — R490 Dysphonia: Secondary | ICD-10-CM

## 2022-12-17 DIAGNOSIS — E049 Nontoxic goiter, unspecified: Secondary | ICD-10-CM

## 2022-12-17 DIAGNOSIS — E782 Mixed hyperlipidemia: Secondary | ICD-10-CM

## 2022-12-17 DIAGNOSIS — Z1211 Encounter for screening for malignant neoplasm of colon: Secondary | ICD-10-CM

## 2022-12-17 DIAGNOSIS — R1310 Dysphagia, unspecified: Secondary | ICD-10-CM

## 2022-12-17 LAB — POCT GLYCOSYLATED HEMOGLOBIN (HGB A1C): HbA1c, POC (controlled diabetic range): 5.8 % (ref 0.0–7.0)

## 2022-12-17 MED ORDER — PANTOPRAZOLE SODIUM 40 MG PO TBEC: 40.0000 mg | DELAYED_RELEASE_TABLET | Freq: Two times a day (BID) | ORAL | 1 refills | Status: DC

## 2022-12-17 MED ORDER — TRULANCE 3 MG PO TABS: 3.0000 mg | ORAL_TABLET | Freq: Every day | ORAL | 3 refills | Status: DC

## 2022-12-17 NOTE — Progress Notes (Signed)
12/17/2022, 1:57 PM  Endocrinology follow-up note   Subjective:    Patient ID: Natalie Cardenas, female    DOB: 1975/12/21, PCP Avis Epley, PA-C   Past Medical History:  Diagnosis Date   Anxiety    Breast cancer [facility])    Depression    Diabetes mellitus without complication [facility])    Thyroid nodule    Past Surgical History:  Procedure Laterality Date   BREAST BIOPSY Right 12/28/2019   x2   BREAST BIOPSY Left 02/01/2020   BREAST RECONSTRUCTION WITH PLACEMENT OF TISSUE EXPANDER AND FLEX HD (ACELLULAR HYDRATED DERMIS) Bilateral 04/29/2020   Procedure: IMMEDIATE BILATERAL BREAST RECONSTRUCTION WITH PLACEMENT OF TISSUE EXPANDER AND FLEX HD (ACELLULAR HYDRATED DERMIS);  Surgeon: Allena Napoleon, MD;  Location: [facility];  Service: Plastics;  Laterality: Bilateral;   NIPPLE SPARING MASTECTOMY WITH SENTINEL LYMPH NODE BIOPSY Bilateral 04/29/2020   Procedure: BILATERAL NIPPLE SPARING MASTECTOMIES WITH RIGHT AXILLARY SENTINEL LYMPH NODE BIOPSY;  Surgeon: Emelia Loron, MD;  Location: [facility];  Service: General;  Laterality: Bilateral;  BILATERAL PEC BLOCK, RNFA   REMOVAL OF BILATERAL TISSUE EXPANDERS WITH PLACEMENT OF BILATERAL BREAST IMPLANTS Bilateral 06/23/2020   Procedure: REMOVAL OF BILATERAL TISSUE EXPANDERS WITH PLACEMENT OF BILATERAL BREAST IMPLANTS;  Surgeon: Allena Napoleon, MD;  Location:  SURGERY CENTER;  Service: Plastics;  Laterality: Bilateral;  1.5 hours   TONSILLECTOMY     Social History   Socioeconomic History   Marital status: Married    Spouse name: Not on file   Number of children: 4   Years of education: Not on file   Highest education level: Not on file  Occupational History   Not on file  Tobacco Use   Smoking status: Never   Smokeless tobacco: Never  Vaping Use   Vaping status: Never Used  Substance and Sexual Activity   Alcohol use: Yes    Comment:  social    Drug use: Never   Sexual activity: Yes  Other Topics Concern   Not on file  Social History Narrative   Lives at home with husband and two children, two out of the house.   Social Determinants of Health   Financial Resource Strain: Not on file  Food Insecurity: Not on file  Transportation Needs: Not on file  Physical Activity: Not on file  Stress: Not on file  Social Connections: Unknown (09/04/2021)   Received from [facility]   Social Network    Social Network: Not on file   Family History  Problem Relation Age of Onset   Diabetes Mother    Heart failure Mother    Asthma Mother    Cancer Father 45       esophageal cancer   Diabetes Maternal Grandmother    Brain cancer Paternal Grandmother    Cancer Paternal Grandfather        esophageal cancer    Outpatient Encounter Medications as of 12/17/2022  Medication Sig   acetaminophen (TYLENOL) 500 MG tablet Take 500 mg by mouth every 6 (six) hours as needed.   albuterol (VENTOLIN HFA) 108 (90 Base) MCG/ACT inhaler    benzonatate (TESSALON) 200 MG capsule Take 1 capsule (200 mg total)  by mouth 3 (three) times daily as needed for cough.   clonazePAM (KLONOPIN) 0.5 MG tablet Take 1 tablet (0.5 mg total) by mouth 2 (two) times daily as needed.   ibuprofen (ADVIL) 200 MG tablet Take 800 mg by mouth daily. Current dose   pantoprazole (PROTONIX) 40 MG tablet Take 1 tablet (40 mg total) by mouth 2 (two) times daily before a meal. Take 30-60 min before first meal of the day   Plecanatide (TRULANCE) 3 MG TABS Take 1 tablet (3 mg total) by mouth daily.   Prenatal Vit-Fe Fumarate-FA (PRENATAL VITAMIN PO) Take 1 tablet by mouth at bedtime.   promethazine-dextromethorphan (PROMETHAZINE-DM) 6.25-15 MG/5ML syrup Take 5 mLs by mouth 4 (four) times daily as needed.   sennosides-docusate sodium (SENOKOT-S) 8.6-50 MG tablet Take 3 tablets by mouth at bedtime.   No facility-administered encounter medications on file as of 12/17/2022.    ALLERGIES: Allergies  Allergen Reactions   Gabapentin Shortness Of Breath   Lyrica Cr [Pregabalin Er] Other (See Comments)    Unable to think clear.   Other Rash    Chlorahexadine wipe/soap    VACCINATION STATUS: Immunization History  Administered Date(s) Administered   Influenza,inj,quad, With Preservative 02/25/2012, 02/02/2019   Influenza-Unspecified 02/25/2012   PFIZER(Purple Top)SARS-COV-2 Vaccination 05/16/2019, 06/06/2019    HPI Natalie Cardenas is 47 y.o. female who presents today with a medical history as above. she is being seen in follow-up after she was seen in consultation for prediabetes requested by Avis Epley, PA-C.   -See notes from her previous visit.  Patient has history of prediabetes, now her labs show significant hyperlipidemia. she was never diagnosed with diabetes, however with A1c between 5.8 and 6.1% over the last couple of years consistent with prediabetes.   -She was advised on dietary modification given her presentation consistent with reactive hypoglycemia during her last visit.   She is not having the symptoms on a regular basis.  She was taken off of acarbose during her last visit.  She presents with steady weight at 157 pounds.    She is not following particular diet.  She is not monitoring blood glucose regularly.  She has dyslipidemia, with LDL at 132, not on treatment, and wishes to avoid medications.  Her other medical problems include breast cancer status post bilateral mastectomy in January 2022 and requested surgery in February 2022. She is not on any antidiabetic medications other than acarbose at this point.   Review of Systems  Constitutional: + Progressive weight gain, +fatigue, no subjective hyperthermia, no subjective hypothermia Eyes: no blurry vision, no xerophthalmia   Objective:       12/17/2022   11:32 AM 12/17/2022    9:45 AM 11/19/2022    8:45 AM  Vitals with BMI  Height 5\' 5"  5\' 5"  5\' 5"   Weight 157 lbs 157 lbs 13  oz 153 lbs  BMI 26.13 26.26 25.46  Systolic 112 138 119  Diastolic 72 83 78  Pulse 64 89 96    BP 112/72   Pulse 64   Ht 5\' 5"  (1.651 m)   Wt 157 lb (71.2 kg)   BMI 26.13 kg/m   Wt Readings from Last 3 Encounters:  12/17/22 157 lb (71.2 kg)  12/17/22 157 lb 12.8 oz (71.6 kg)  11/19/22 153 lb (69.4 kg)    Physical Exam  Constitutional:  Body mass index is 26.13 kg/m.,  not in acute distress, normal state of mind Eyes: PERRLA, EOMI, no exophthalmos Neck: Palpable goiter CMP (  most recent) CMP     Component Value Date/Time   NA 141 12/10/2022 1403   K 4.2 12/10/2022 1403   CL 104 12/10/2022 1403   CO2 23 12/10/2022 1403   GLUCOSE 85 12/10/2022 1403   GLUCOSE 141 (H) 10/21/2022 1357   BUN 14 12/10/2022 1403   CREATININE 0.78 12/10/2022 1403   CREATININE 0.73 01/07/2020 1619   CALCIUM 9.7 12/10/2022 1403   PROT 6.6 12/10/2022 1403   ALBUMIN 4.3 12/10/2022 1403   AST 19 12/10/2022 1403   AST 16 01/07/2020 1619   ALT 24 12/10/2022 1403   ALT 15 01/07/2020 1619   ALKPHOS 114 12/10/2022 1403   BILITOT 0.3 12/10/2022 1403   BILITOT 0.5 01/07/2020 1619   GFRNONAA >60 06/18/2020 1455   GFRNONAA >60 01/07/2020 1619   GFRAA >60 01/07/2020 1619   January 2024 labs include A1c of 5.9%, TSH 1.47, total T47.1, total T3 136.  Prior lipid panel showed total cholesterol 193, trig 58, HDL 72, LDL 110.  Recent Results (from the past 2160 hour(s))  POCT EXHALED NITRIC OXIDE     Status: Normal   Collection Time: 10/21/22  1:50 PM  Result Value Ref Range   FeNO level (ppb) 15   CBC w/Diff     Status: Abnormal   Collection Time: 10/21/22  1:57 PM  Result Value Ref Range   WBC 14.8 (H) 4.0 - 10.5 K/uL   RBC 4.29 3.87 - 5.11 Mil/uL   Hemoglobin 13.1 12.0 - 15.0 g/dL   HCT 64.4 03.4 - 74.2 %   MCV 93.2 78.0 - 100.0 fl   MCHC 32.7 30.0 - 36.0 g/dL   RDW 59.5 63.8 - 75.6 %   Platelets 283.0 150.0 - 400.0 K/uL   Neutrophils Relative % 77.3 (H) 43.0 - 77.0 %   Lymphocytes Relative  14.2 12.0 - 46.0 %   Monocytes Relative 8.1 3.0 - 12.0 %   Eosinophils Relative 0.3 0.0 - 5.0 %   Basophils Relative 0.1 0.0 - 3.0 %   Neutro Abs 11.4 (H) 1.4 - 7.7 K/uL   Lymphs Abs 2.1 0.7 - 4.0 K/uL   Monocytes Absolute 1.2 (H) 0.1 - 1.0 K/uL   Eosinophils Absolute 0.0 0.0 - 0.7 K/uL   Basophils Absolute 0.0 0.0 - 0.1 K/uL  Basic Metabolic Panel (BMET)     Status: Abnormal   Collection Time: 10/21/22  1:57 PM  Result Value Ref Range   Sodium 137 135 - 145 mEq/L   Potassium 4.0 3.5 - 5.1 mEq/L   Chloride 100 96 - 112 mEq/L   CO2 26 19 - 32 mEq/L   Glucose, Bld 141 (H) 70 - 99 mg/dL   BUN 20 6 - 23 mg/dL   Creatinine, Ser 4.33 0.40 - 1.20 mg/dL   GFR 29.51 >88.41 mL/min    Comment: Calculated using the CKD-EPI Creatinine Equation (2021)   Calcium 10.5 8.4 - 10.5 mg/dL  D-Dimer, Quantitative     Status: None   Collection Time: 10/21/22  1:57 PM  Result Value Ref Range   D-Dimer, Quant <0.19 <0.50 mcg/mL FEU    Comment: . The D-Dimer test is used frequently to exclude an acute PE or DVT. In patients with a low to moderate clinical risk assessment and a D-Dimer result <0.50 mcg/mL FEU, the likelihood of a PE or DVT is very low. However, a thromboembolic event should not be excluded solely on the basis of the D-Dimer level. Increased levels of D-Dimer are associated with  a PE, DVT, DIC, malignancies, inflammation, sepsis, surgery, trauma, pregnancy, and advancing patient age. [Jama 2006 11:295(2):199-207] . For additional information, please refer to: http://education.quest[employer].com/faq/FAQ149 (This link is being provided for informational/ educational purposes only) .   Pulmonary function test     Status: None   Collection Time: 11/18/22  1:46 PM  Result Value Ref Range   FVC-Pre 3.18 L   FVC-%Pred-Pre 85 %   FVC-Post 3.34 L   FVC-%Pred-Post 90 %   FVC-%Change-Post 5 %   FEV1-Pre 2.52 L   FEV1-%Pred-Pre 85 %   FEV1-Post 2.17 L   FEV1-%Pred-Post 73 %    FEV1-%Change-Post -13 %   FEV6-Pre 3.15 L   FEV6-%Pred-Pre 87 %   FEV6-Post 3.12 L   FEV6-%Pred-Post 86 %   FEV6-%Change-Post 0 %   Pre FEV1/FVC ratio 79 %   FEV1FVC-%Pred-Pre 98 %   Post FEV1/FVC ratio 65 %   FEV1FVC-%Change-Post -17 %   Pre FEV6/FVC Ratio 100 %   FEV6FVC-%Pred-Pre 102 %   Post FEV6/FVC ratio 93 %   FEV6FVC-%Pred-Post 95 %   FEV6FVC-%Change-Post -6 %   FEF 25-75 Pre 2.45 L/sec   FEF2575-%Pred-Pre 84 %   FEF 25-75 Post 1.12 L/sec   FEF2575-%Pred-Post 38 %   FEF2575-%Change-Post -54 %   RV 1.38 L   RV % pred 76 %   TLC 4.59 L   TLC % pred 88 %   DLCO unc 19.37 ml/min/mmHg   DLCO unc % pred 88 %   DL/VA 2.84 ml/min/mmHg/L   DL/VA % pred 132 %  Basic metabolic panel     Status: Abnormal   Collection Time: 11/19/22  9:22 AM  Result Value Ref Range   Glucose 143 (H) 70 - 99 mg/dL   BUN 20 6 - 24 mg/dL   Creatinine, Ser 4.40 0.57 - 1.00 mg/dL   eGFR 98 >10 UV/OZD/6.64   BUN/Creatinine Ratio 27 (H) 9 - 23   Sodium 140 134 - 144 mmol/L   Potassium 4.5 3.5 - 5.2 mmol/L   Chloride 101 96 - 106 mmol/L   CO2 23 20 - 29 mmol/L   Calcium 9.6 8.7 - 10.2 mg/dL  IgE     Status: None   Collection Time: 11/19/22  9:22 AM  Result Value Ref Range   IgE (Immunoglobulin E), Serum 408 6 - 495 IU/mL  CBC with Differential/Platelet     Status: Abnormal   Collection Time: 11/19/22  9:22 AM  Result Value Ref Range   WBC 10.0 3.4 - 10.8 x10E3/uL   RBC 4.09 3.77 - 5.28 x10E6/uL   Hemoglobin 12.5 11.1 - 15.9 g/dL   Hematocrit 40.3 47.4 - 46.6 %   MCV 92 79 - 97 fL   MCH 30.6 26.6 - 33.0 pg   MCHC 33.1 31.5 - 35.7 g/dL   RDW 25.9 56.3 - 87.5 %   Platelets 329 150 - 450 x10E3/uL   Neutrophils 72 Not Estab. %   Lymphs 20 Not Estab. %   Monocytes 7 Not Estab. %   Eos 0 Not Estab. %   Basos 0 Not Estab. %   Neutrophils Absolute 7.1 (H) 1.4 - 7.0 x10E3/uL   Lymphocytes Absolute 2.0 0.7 - 3.1 x10E3/uL   Monocytes Absolute 0.7 0.1 - 0.9 x10E3/uL   EOS (ABSOLUTE) 0.0 0.0 -  0.4 x10E3/uL   Basophils Absolute 0.0 0.0 - 0.2 x10E3/uL   Immature Granulocytes 1 Not Estab. %   Immature Grans (Abs) 0.1 0.0 - 0.1 x10E3/uL  Comprehensive metabolic panel     Status: None   Collection Time: 12/10/22  2:03 PM  Result Value Ref Range   Glucose 85 70 - 99 mg/dL   BUN 14 6 - 24 mg/dL   Creatinine, Ser 4.09 0.57 - 1.00 mg/dL   eGFR 94 >81 XB/JYN/8.29   BUN/Creatinine Ratio 18 9 - 23   Sodium 141 134 - 144 mmol/L   Potassium 4.2 3.5 - 5.2 mmol/L   Chloride 104 96 - 106 mmol/L   CO2 23 20 - 29 mmol/L   Calcium 9.7 8.7 - 10.2 mg/dL   Total Protein 6.6 6.0 - 8.5 g/dL   Albumin 4.3 3.9 - 4.9 g/dL   Globulin, Total 2.3 1.5 - 4.5 g/dL   Bilirubin Total 0.3 0.0 - 1.2 mg/dL   Alkaline Phosphatase 114 44 - 121 IU/L   AST 19 0 - 40 IU/L   ALT 24 0 - 32 IU/L  Lipid panel     Status: Abnormal   Collection Time: 12/10/22  2:03 PM  Result Value Ref Range   Cholesterol, Total 213 (H) 100 - 199 mg/dL   Triglycerides 63 0 - 149 mg/dL   HDL 70 >56 mg/dL   VLDL Cholesterol Cal 11 5 - 40 mg/dL   LDL Chol Calc (NIH) 213 (H) 0 - 99 mg/dL   Chol/HDL Ratio 3.0 0.0 - 4.4 ratio    Comment:                                   T. Chol/HDL Ratio                                             Men  Women                               1/2 Avg.Risk  3.4    3.3                                   Avg.Risk  5.0    4.4                                2X Avg.Risk  9.6    7.1                                3X Avg.Risk 23.4   11.0   TSH     Status: None   Collection Time: 12/10/22  2:03 PM  Result Value Ref Range   TSH 0.676 0.450 - 4.500 uIU/mL  T4, free     Status: None   Collection Time: 12/10/22  2:03 PM  Result Value Ref Range   Free T4 1.09 0.82 - 1.77 ng/dL  Thyroid peroxidase antibody     Status: None   Collection Time: 12/10/22  2:03 PM  Result Value Ref Range   Thyroperoxidase Ab SerPl-aCnc 13 0 - 34 IU/mL  Thyroglobulin antibody     Status: None   Collection Time: 12/10/22  2:03 PM   Result Value Ref Range  Thyroglobulin Antibody <1.0 0.0 - 0.9 IU/mL    Comment: Thyroglobulin Antibody measured by [employer] Methodology It should be noted that the presence of thyroglobulin antibodies may not be pathogenic nor diagnostic, especially at very low levels. The assay manufacturer has found that four percent of individuals without evidence of thyroid disease or autoimmunity will have positive TgAb levels up to 4 IU/mL.   HgB A1c     Status: None   Collection Time: 12/17/22 11:42 AM  Result Value Ref Range   Hemoglobin A1C     HbA1c POC (<> result, manual entry)     HbA1c, POC (prediabetic range)     HbA1c, POC (controlled diabetic range) 5.8 0.0 - 7.0 %      Assessment & Plan:   1. Prediabetes   2.  Reactive hypoglycemia   3.  Hyperlipidemia  4.  Goiter  - I have reviewed her new and available endocrine records and clinically evaluated the patient. - Based on these reviews, she has prediabetes with A1c of 5.8-6.1%, hyperlipidemia, and reactive hypoglycemia.  Review of her medical history does not indicate chronic liver disease nor chronic kidney disease. To address these related comorbidities, she remains a candidate for lifestyle medicine.    - she acknowledges that there is a room for improvement in her food and drink choices. - Suggestion is made for her to avoid simple carbohydrates  from her diet including Cakes, Sweet Desserts, Ice Cream, Soda (diet and regular), Sweet Tea, Candies, Chips, Cookies, Store Bought Juices, Alcohol , Artificial Sweeteners,  Coffee Creamer, and "Sugar-free" Products, Lemonade. This will help patient to have more stable blood glucose profile and potentially avoid unintended weight gain.  The following Lifestyle Medicine recommendations according to American College of Lifestyle Medicine  [facility]) were discussed and and offered to patient and she  agrees to start the journey:  A. Whole Foods, Plant-Based Nutrition comprising of  fruits and vegetables, plant-based proteins, whole-grain carbohydrates was discussed in detail with the patient.   A list for source of those nutrients were also provided to the patient.  Patient will use only water or unsweetened tea for hydration. B.  The need to stay away from risky substances including alcohol, smoking; obtaining 7 to 9 hours of restorative sleep, at least 150 minutes of moderate intensity exercise weekly, the importance of healthy social connections,  and stress management techniques were discussed. C.  A full color page of  Calorie density of various food groups per pound showing examples of each food groups was provided to the patient.   -She is advised to stay off of acarbose.   -If her next labs show LDL still above 100 mg per DL, she will be considered for low-dose statins. -Her previsit thyroid function tests are consistent with euthyroid presentation.  She will need thyroid ultrasound given her clinical goiter.    - she is advised to maintain close follow up with Avis Epley, PA-C for primary care needs.  I spent  26  minutes in the care of the patient today including review of labs from Thyroid Function, CMP, and other relevant labs ; imaging/biopsy records (current and previous including abstractions from other facilities); face-to-face time discussing  her lab results and symptoms, medications doses, her options of short and long term treatment based on the latest standards of care / guidelines;   and documenting the encounter.  Shauntea Kady  participated in the discussions, expressed understanding, and voiced agreement with the above plans.  All questions were answered to her satisfaction. she is encouraged to contact clinic should she have any questions or concerns prior to her return visit.  Follow up plan: Return in about 6 months (around 06/19/2023) for Fasting Labs  in AM B4 8, A1c -NV, Thyroid / Neck Ultrasound.   Marquis Lunch, MD [facility]  [facility] [address] [place], [place] [address] Phone: [phone]  Fax: [phone]     12/17/2022, 1:57 PM  This note was partially dictated with voice recognition software. Similar sounding words can be transcribed inadequately or may not  be corrected upon review.

## 2022-12-17 NOTE — Patient Instructions (Signed)

## 2022-12-17 NOTE — Progress Notes (Unsigned)
GI Office Note    Referring Provider: Avis Epley, PA* Primary Care Physician:  Ladon Applebaum  Primary Gastroenterologist:  Chief Complaint   Chief Complaint  Patient presents with   Abdominal Pain    Pain in her chest, sob, with a cough and constant clearing of throat. Been to pulmonary,cardiology, has an appt with ent and allergy.      History of Present Illness   Natalie Cardenas is a 47 y.o. female presenting today at the request of Terie Purser, PA-C for cough.      Senokot to go to [employer].    Few years,  Four years ago sob, "bronchtitis" 3 months. Since then. Left chest pain and SOB. Right sided nerve pain better so notice it more. Gets chest pain, sob, random. No stress related. Like can't catch breath. Raspy voice, two weeks whispering faint  Does not feel congesting. Talking prmpt cough. Constant clearing of throat. Keep cough drop in mouth to keep moisture. Dr wert said to do it   Father, barrett, esophagu can Son gerd   Can't exerccise due to sob Pcp tried metoprolol Two weeks ago after seeing dr wert. Stopped med until someone tells why. Sensitivy med sometimes side effects worse than what is prescribed.  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   No chemo/xrt  Bilateral mastectomy/reconstration  Chweable alka seltzer off on for years. Sharp pain left chest, chew and helps No nocturnal symptoms,   Feels like a tightness if throat. But not like blockage. Stuff stopped in chest. Mild.   Up 25 pounds in past 2 years. Feels swollen in gernal. Face swoolen***  Steroids twice this summer. Over a month.  Seena 1-2 times per week.   No bm if not taking.  Tried miralax capful every day.  Fiber powder did not help.  Occ mag citrate, used to would clearn out but now no BM.   Always constipation, first gi in 20s. No prior  colonoscopy.   Pater gf esopha cancer   Ppi h2 for months   ***sometimes food goes up and has to make go down in the oral.    CXR 09/2022; unremarkable  Pumonologist, Dr. Sherene Sires. Pantoprazole 40mg  before breakfast and Pepcid 20mg  after supper. GERD diet and lifesytle modifications.   Irritable larnx syndrome gabapentin  cardio  Medications   Current Outpatient Medications  Medication Sig Dispense Refill   acetaminophen (TYLENOL) 500 MG tablet Take 500 mg by mouth every 6 (six) hours as needed.     albuterol (VENTOLIN HFA) 108 (90 Base) MCG/ACT inhaler      benzonatate (TESSALON) 200 MG capsule Take 1 capsule (200 mg total) by mouth 3 (three) times daily as needed for cough. 40 capsule 3   clonazePAM (KLONOPIN) 0.5 MG tablet Take 1 tablet (0.5 mg total) by mouth 2 (two) times daily as needed. 30 tablet 0   famotidine (PEPCID) 20 MG tablet One after supper 30 tablet 11   ibuprofen (ADVIL) 200 MG tablet Take 800 mg by mouth daily. Current dose     pantoprazole (PROTONIX) 40 MG tablet TAKE 1 TABLET (40 MG TOTAL) BY MOUTH DAILY. TAKE 30-60 MIN BEFORE FIRST MEAL OF THE DAY 90 tablet 1   Prenatal Vit-Fe Fumarate-FA (PRENATAL VITAMIN PO) Take 1 tablet by mouth at bedtime.     promethazine-dextromethorphan (PROMETHAZINE-DM) 6.25-15 MG/5ML syrup Take 5 mLs by mouth  4 (four) times daily as needed. 118 mL 0   sennosides-docusate sodium (SENOKOT-S) 8.6-50 MG tablet Take 3 tablets by mouth at bedtime.     No current facility-administered medications for this visit.    Allergies   Allergies as of 12/17/2022 - Review Complete 12/17/2022  Allergen Reaction Noted   Gabapentin Shortness Of Breath 03/12/2021   Lyrica cr [pregabalin er] Other (See Comments) 04/01/2021   Other Rash 06/17/2020    Past Medical History   Past Medical History:  Diagnosis Date   Anxiety    Breast cancer (HCC)    Depression    Diabetes mellitus without complication (HCC)    Thyroid nodule     Past  Surgical History   Past Surgical History:  Procedure Laterality Date   BREAST BIOPSY Right 12/28/2019   x2   BREAST BIOPSY Left 02/01/2020   BREAST RECONSTRUCTION WITH PLACEMENT OF TISSUE EXPANDER AND FLEX HD (ACELLULAR HYDRATED DERMIS) Bilateral 04/29/2020   Procedure: IMMEDIATE BILATERAL BREAST RECONSTRUCTION WITH PLACEMENT OF TISSUE EXPANDER AND FLEX HD (ACELLULAR HYDRATED DERMIS);  Surgeon: Allena Napoleon, MD;  Location: [facility];  Service: Plastics;  Laterality: Bilateral;   NIPPLE SPARING MASTECTOMY WITH SENTINEL LYMPH NODE BIOPSY Bilateral 04/29/2020   Procedure: BILATERAL NIPPLE SPARING MASTECTOMIES WITH RIGHT AXILLARY SENTINEL LYMPH NODE BIOPSY;  Surgeon: Emelia Loron, MD;  Location: [facility];  Service: General;  Laterality: Bilateral;  BILATERAL PEC BLOCK, RNFA   REMOVAL OF BILATERAL TISSUE EXPANDERS WITH PLACEMENT OF BILATERAL BREAST IMPLANTS Bilateral 06/23/2020   Procedure: REMOVAL OF BILATERAL TISSUE EXPANDERS WITH PLACEMENT OF BILATERAL BREAST IMPLANTS;  Surgeon: Allena Napoleon, MD;  Location: [facility];  Service: Plastics;  Laterality: Bilateral;  1.5 hours   TONSILLECTOMY      Past Family History   Family History  Problem Relation Age of Onset   Diabetes Mother    Heart failure Mother    Asthma Mother    Cancer Father 55       esophageal cancer   Diabetes Maternal Grandmother    Brain cancer Paternal Grandmother    Cancer Paternal Grandfather        esophageal cancer     Past Social History   Social History   Socioeconomic History   Marital status: Married    Spouse name: Not on file   Number of children: 4   Years of education: Not on file   Highest education level: Not on file  Occupational History   Not on file  Tobacco Use   Smoking status: Never   Smokeless tobacco: Never  Vaping Use   Vaping status: Never Used  Substance and Sexual Activity   Alcohol use: Yes    Comment: social    Drug  use: Never   Sexual activity: Yes  Other Topics Concern   Not on file  Social History Narrative   Lives at home with husband and two children, two out of the house.   Social Determinants of Health   Financial Resource Strain: Not on file  Food Insecurity: Not on file  Transportation Needs: Not on file  Physical Activity: Not on file  Stress: Not on file  Social Connections: Unknown (09/04/2021)   Received from [facility]   Social Network    Social Network: Not on file  Intimate Partner Violence: Unknown (07/28/2021)   Received from [facility]   HITS    Physically Hurt: Not on file    Insult or Talk Down  To: Not on file    Threaten Physical Harm: Not on file    Scream or Curse: Not on file    Review of Systems   General: Negative for anorexia, weight loss, fever, chills, fatigue, weakness. Eyes: Negative for vision changes.  ENT: Negative for hoarseness, difficulty swallowing , nasal congestion. CV: Negative for chest pain, angina, palpitations, dyspnea on exertion, peripheral edema.  Respiratory: Negative for dyspnea at rest, dyspnea on exertion, cough, sputum, wheezing.  GI: See history of present illness. GU:  Negative for dysuria, hematuria, urinary incontinence, urinary frequency, nocturnal urination.  MS: Negative for joint pain, low back pain.  Derm: Negative for rash or itching.  Neuro: Negative for weakness, abnormal sensation, seizure, frequent headaches, memory loss,  confusion.  Psych: Negative for anxiety, depression, suicidal ideation, hallucinations.  Endo: Negative for unusual weight change.  Heme: Negative for bruising or bleeding. Allergy: Negative for rash or hives.  Physical Exam   BP 138/83 (BP Location: Right Arm, Patient Position: Sitting, Cuff Size: Normal)   Pulse 89   Temp (!) 97.4 F (36.3 C) (Oral)   Ht 5\' 5"  (1.651 m)   Wt 157 lb 12.8 oz (71.6 kg)   LMP  (LMP Unknown)   SpO2 99%   BMI 26.26 kg/m    General: Well-nourished,  well-developed in no acute distress.  Head: Normocephalic, atraumatic.   Eyes: Conjunctiva pink, no icterus. Mouth: Oropharyngeal mucosa moist and pink , no lesions erythema or exudate. Neck: Supple without thyromegaly, masses, or lymphadenopathy.  Lungs: Clear to auscultation bilaterally.  Heart: Regular rate and rhythm, no murmurs rubs or gallops.  Abdomen: Bowel sounds are normal, nontender, nondistended, no hepatosplenomegaly or masses,  no abdominal bruits or hernia, no rebound or guarding.   Rectal: *** Extremities: No lower extremity edema. No clubbing or deformities.  Neuro: Alert and oriented x 4 , grossly normal neurologically.  Skin: Warm and dry, no rash or jaundice.   Psych: Alert and cooperative, normal mood and affect.  Labs   Lab Results  Component Value Date   TSH 0.676 12/10/2022   Lab Results  Component Value Date   NA 141 12/10/2022   CL 104 12/10/2022   K 4.2 12/10/2022   CO2 23 12/10/2022   BUN 14 12/10/2022   CREATININE 0.78 12/10/2022   EGFR 94 12/10/2022   CALCIUM 9.7 12/10/2022   ALBUMIN 4.3 12/10/2022   GLUCOSE 85 12/10/2022   Lab Results  Component Value Date   ALT 24 12/10/2022   AST 19 12/10/2022   ALKPHOS 114 12/10/2022   BILITOT 0.3 12/10/2022   Lab Results  Component Value Date   WBC 10.0 11/19/2022   HGB 12.5 11/19/2022   HCT 37.8 11/19/2022   MCV 92 11/19/2022   PLT 329 11/19/2022   Lab Results  Component Value Date   ESRSEDRATE 4 05/14/2022   Lab Results  Component Value Date   HGBA1C 5.9 05/07/2022    Imaging Studies   No results found.  Assessment       PLAN   *** rourk  Leanna Battles. Melvyn Neth, MHS, PA-C [facility]

## 2022-12-17 NOTE — Patient Instructions (Addendum)
Stop famotidine. Increase pantoprazole 40mg  twice daily before breakfast and evening meal. Follow diet for reflux as some of your symptoms could be reflux related.  Start Trulance 3 mg daily for constipation. Please call if your stools are still infrequent.  We will schedule you for a colonoscopy and upper endoscopy in the near future. We will follow up on records after you have seen ENT and allergist to monitor your work up.    It was a pleasure to see you today.  I truly value your feedback, so please be on the lookout for a survey regarding your visit with me today. I appreciate your time in completing this!

## 2022-12-18 DIAGNOSIS — Z1211 Encounter for screening for malignant neoplasm of colon: Secondary | ICD-10-CM | POA: Insufficient documentation

## 2022-12-20 ENCOUNTER — Telehealth: Payer: Self-pay | Admitting: *Deleted

## 2022-12-20 NOTE — Telephone Encounter (Signed)
[facility] [place]  TCS/EGD +/-ED ASA 2, w/Dr.Rourk

## 2022-12-21 NOTE — Telephone Encounter (Signed)
Pt left vm returning call  LMTRC 

## 2022-12-29 IMAGING — MR MR BREAST BILAT WO/W CM
13 of 19 series · 33 of 48 positions shown · IV contrast (6ml gadavist)
Comparison: Prior mammograms and MRs.

CLINICAL DATA: 45-year-old female with history of RIGHT breast
cancer, bilateral mastectomies and implant reconstruction in 0900.
Continued UPPER RIGHT breast pain.

EXAM:
BILATERAL BREAST MRI WITH AND WITHOUT CONTRAST
TECHNIQUE: Multiplanar, multisequence MR images of both breasts were obtained
prior to and following the intravenous administration of 6 ml of
Gadavist

[Series 2: t2_tirm_tra ipat (a-p) · axial · 3.0mm · 0.70mm/px · 1 of 60 slices shown]
[im 1/60]
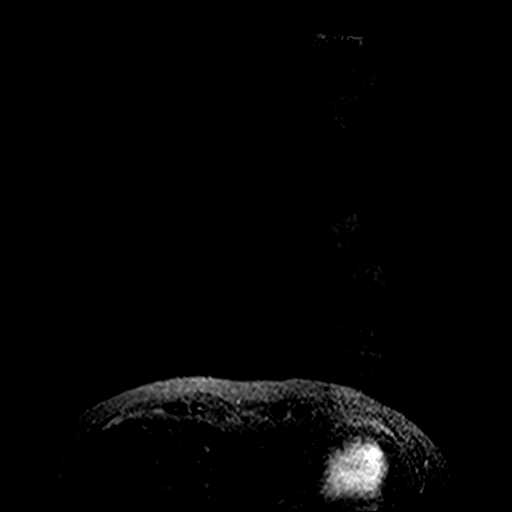

[Series 3: fl3d pre-cm no · axial · non-contrast · 1.2mm · 0.94mm/px · z∈[-82,+89]mm · 3 of 144 slices shown]
[im 1/144]
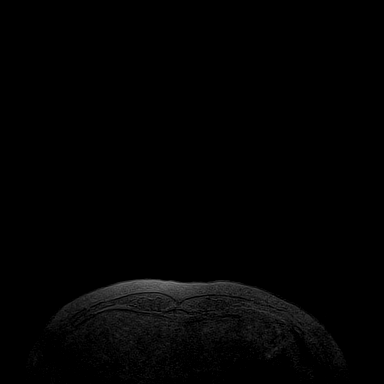
[im 72/144]
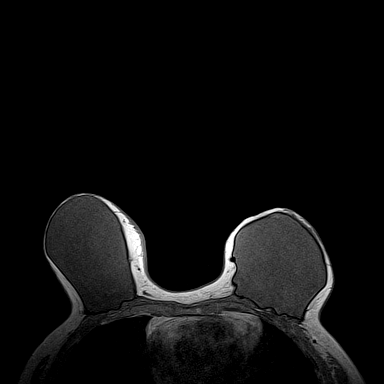
[im 144/144]
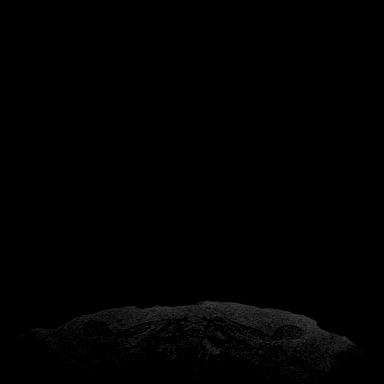

[Series 4: fl3d pre-cm · axial · non-contrast · 1.2mm · 0.94mm/px · z∈[-86,+85]mm · 3 of 144 slices shown]
[im 1/144]
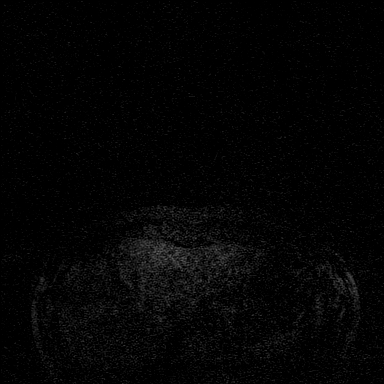
[im 72/144]
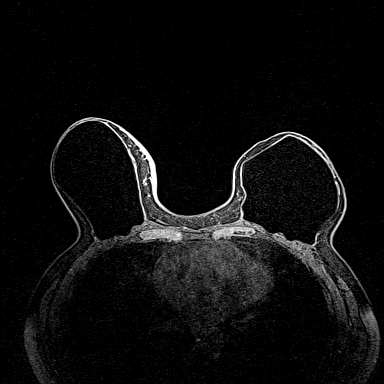
[im 144/144]
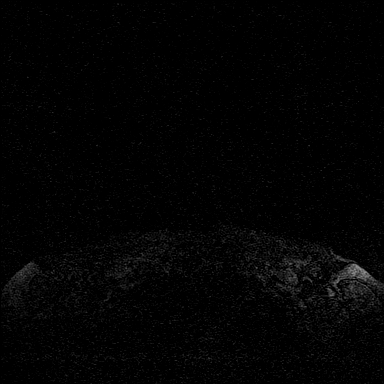

[Series 5: fl3d post-cm 20 · axial · 1.2mm · 0.94mm/px · z∈[-86,+85]mm · 3 of 144 slices shown (1 of 3)]
[im 1/144]
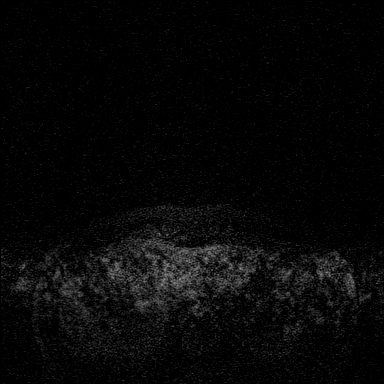
[im 72/144]
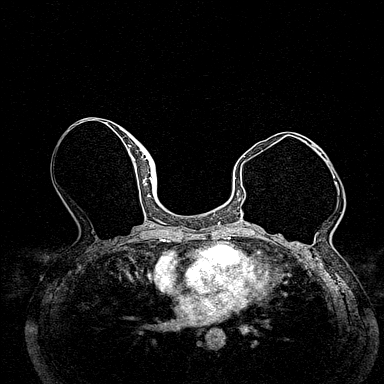
[im 144/144]
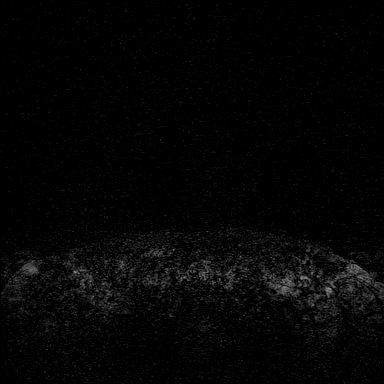

[Series 6: fl3d post-cm 20 · axial · 1.2mm · 0.94mm/px · z∈[-86,+85]mm · 3 of 144 slices shown (2 of 3)]
[im 1/144]
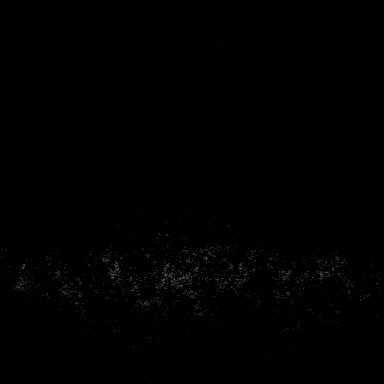
[im 72/144]
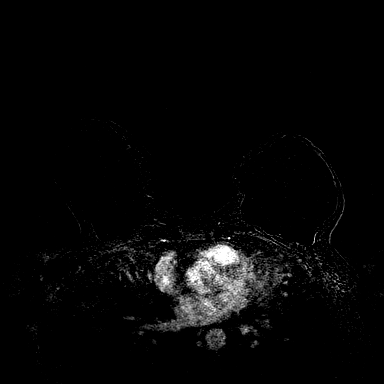
[im 144/144]
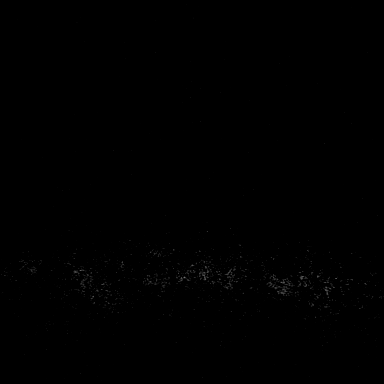

[Series 7: fl3d post-cm 20 · axial · 172.8mm · 0.94mm/px · 1 of 1 slices shown (3 of 3)]
[im 1/1]
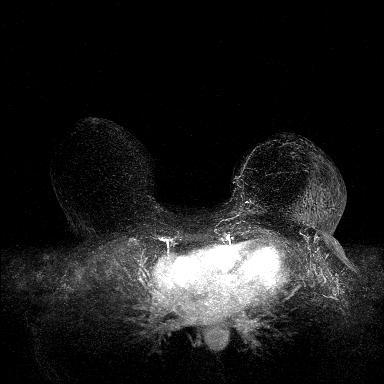

[Series 8: fl3d post-cm 3min · axial · 1.2mm · 0.94mm/px · z∈[-86,+85]mm · 4 of 144 slices shown]
[im 1/144]
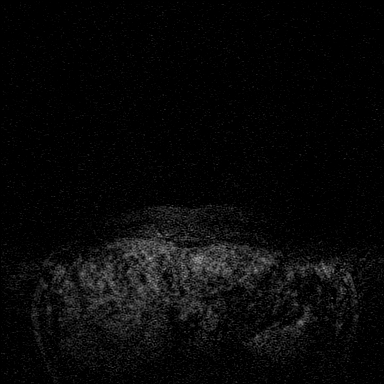
[im 48/144]
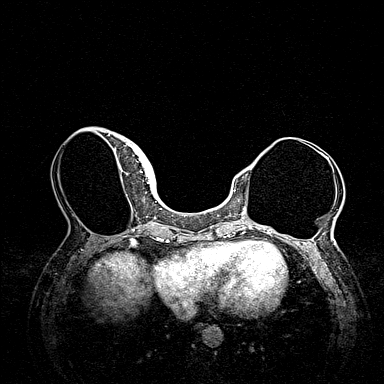
[im 96/144]
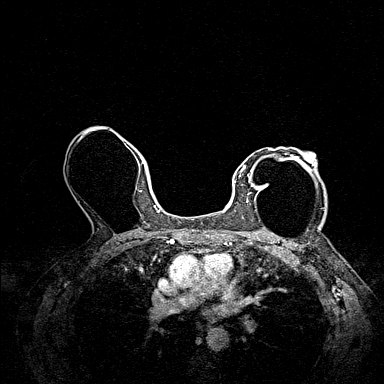
[im 144/144]
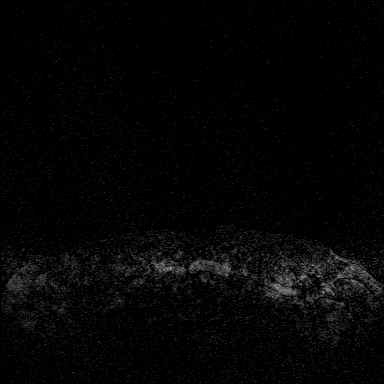

[Series 9: fl3d post-cm 3min_sub · axial · 1.2mm · 0.94mm/px · z∈[-86,+85]mm · 4 of 144 slices shown]
[im 1/144]
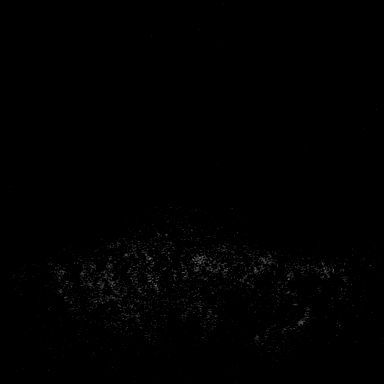
[im 48/144]
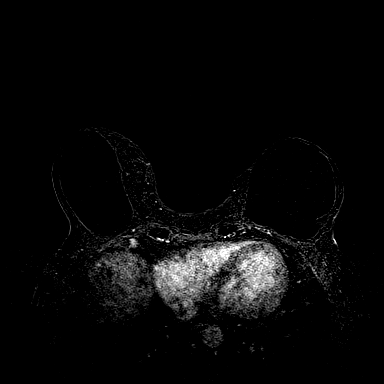
[im 96/144]
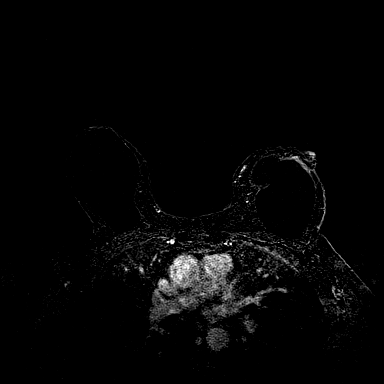
[im 144/144]
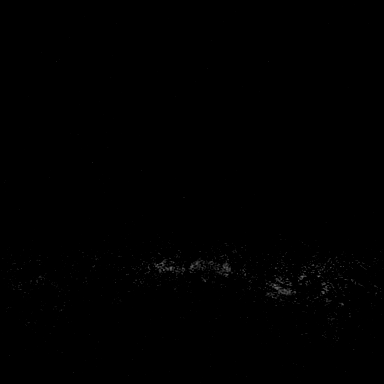

[Series 10: fl3d post-cm 3min_sub_mip_tra · axial · 172.8mm · 0.94mm/px · 1 of 1 slices shown]
[im 1/1]
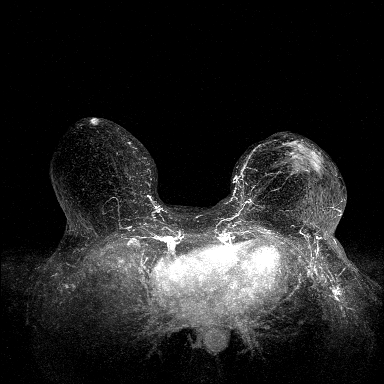

[Series 11: fl3d post-cm 5min · axial · 1.2mm · 0.94mm/px · z∈[-86,+85]mm · 4 of 144 slices shown]
[im 1/144]
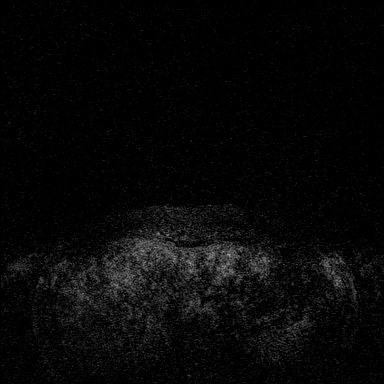
[im 48/144]
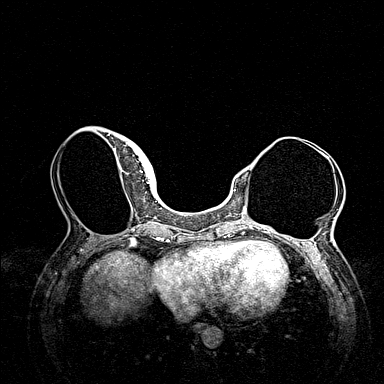
[im 96/144]
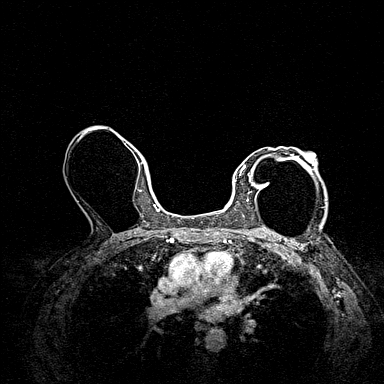
[im 144/144]
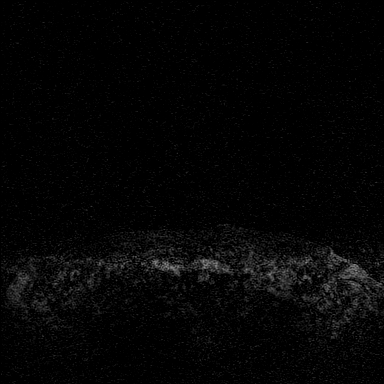

[Series 12: fl3d post-cm 5min_sub · axial · 1.2mm · 0.94mm/px · z∈[-86,+85]mm · 4 of 144 slices shown]
[im 1/144]
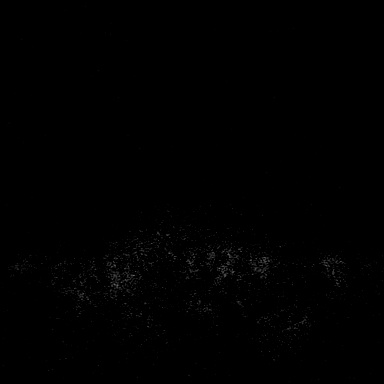
[im 48/144]
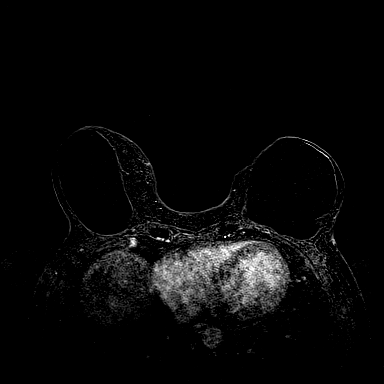
[im 96/144]
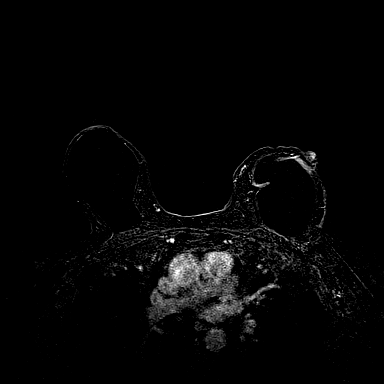
[im 144/144]
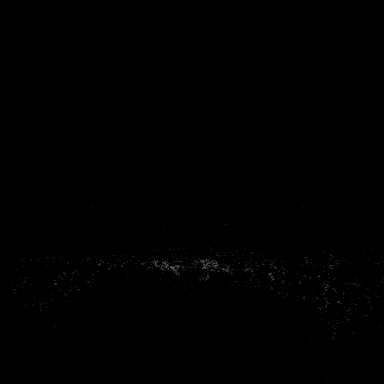

[Series 13: fl3d post-cm 5min_sub_mip_tra · axial · 172.8mm · 0.94mm/px · 1 of 1 slices shown]
[im 1/1]
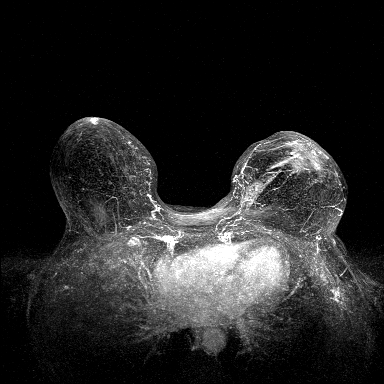

[Series 14: fl3d post-sag rt · sagittal · 1.2mm · 0.94mm/px · 1 of 144 slices shown]
[im 1/144]
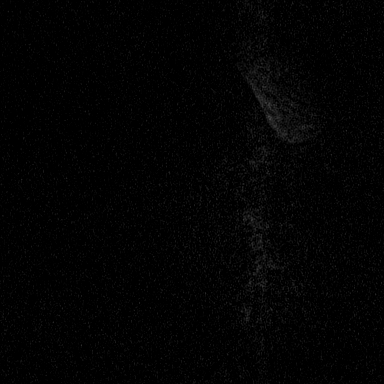

[33 of 48 positions shown; findings below may reference images not displayed]

Three-dimensional MR images were rendered by post-processing of the
original MR data on an independent workstation. The
three-dimensional MR images were interpreted, and findings are
reported in the following complete MRI report for this study. Three
dimensional images were evaluated at the independent interpreting
workstation using the DynaCAD thin client.
FINDINGS: Breast composition: a. Almost entirely fat.

Background parenchymal enhancement: Minimal

Right breast: No mass or suspicious enhancement. A prepectoral
implant is noted without evidence of rupture. The RIGHT implant is
elongated more in the AP dimension as compared to the LEFT.

Left breast: No mass or suspicious enhancement. A prepectoral
implant is noted without evidence of rupture.

Lymph nodes: No abnormal appearing lymph nodes.

Ancillary findings:  None.
IMPRESSION: 1. No evidence of breast malignancy.
2. Bilateral prepectoral implants without evidence of rupture. The
RIGHT implant has an increased AP dimension which can be seen with
capsular contracture. Correlate clinically.

RECOMMENDATION:
Clinical follow-up.

BI-RADS CATEGORY  1: Negative.

## 2023-01-07 ENCOUNTER — Ambulatory Visit: Payer: Managed Care, Other (non HMO) | Admitting: Allergy & Immunology

## 2023-01-07 ENCOUNTER — Other Ambulatory Visit: Payer: Self-pay

## 2023-01-07 ENCOUNTER — Encounter: Payer: Self-pay | Admitting: Allergy & Immunology

## 2023-01-07 VITALS — BP 120/70 | HR 91 | Temp 98.6°F | Ht 64.96 in | Wt 155.8 lb

## 2023-01-07 DIAGNOSIS — J302 Other seasonal allergic rhinitis: Secondary | ICD-10-CM

## 2023-01-07 DIAGNOSIS — R490 Dysphonia: Secondary | ICD-10-CM | POA: Diagnosis not present

## 2023-01-07 DIAGNOSIS — J3089 Other allergic rhinitis: Secondary | ICD-10-CM

## 2023-01-07 MED ORDER — RYALTRIS 665-25 MCG/ACT NA SUSP: 2.0000 | Freq: Every day | NASAL | 5 refills | Status: DC

## 2023-01-07 MED ORDER — MONTELUKAST SODIUM 10 MG PO TABS: 10.0000 mg | ORAL_TABLET | Freq: Every day | ORAL | 1 refills | Status: DC

## 2023-01-07 NOTE — Progress Notes (Signed)
NEW PATIENT  Date of Service/Encounter:  01/07/23  Consult requested by: Avis Epley, PA-C   Assessment:   Hoarseness  Seasonal and perennial allergic rhinitis (trees, indoor molds, outdoor molds, dust mites, cat, and dog) - addressing with medication management for now  Plan/Recommendations:   1. Hoarseness - I think that this could be related to postnasal drip. - We will try to manage this aggressively and see how this works.   2. Seasonal and perennial allergic rhinitis - Testing today showed: trees, indoor molds, outdoor molds, dust mites, cat, and dog - Copy of test results provided.  - Avoidance measures provided. - Definitely focus on DUST MITE coverings and get RID OF THE humidifier.  - Get a HEPA filter for the bedroom at least to decrease dander loads in that room.  - Get a mold kit from [employer] to check your workplace environment.  - Continue with: Allegra (fexofenadine) 180mg  tablet once daily - Start taking: Singulair (montelukast) 10mg  daily and Ryaltris (olopatadine/mometasone) two sprays per nostril 1-2 times daily as needed - Singulair can cause irritability and bad dreams, so beware of this and stop it if this becomes a problem.  - You can use an extra dose of the antihistamine, if needed, for breakthrough symptoms.  - Consider nasal saline rinses 1-2 times daily to remove allergens from the nasal cavities as well as help with mucous clearance (this is especially helpful to do before the nasal sprays are given) - Consider allergy shots as a means of long-term control. - Allergy shots "re-train" and "reset" the immune system to ignore environmental allergens and decrease the resulting immune response to those allergens (sneezing, itchy watery eyes, runny nose, nasal congestion, etc).    - Allergy shots improve symptoms in 75-85% of patients.  - We can discuss more at the next appointment if the medications are not working for you.  3. Return in about 4  weeks (around 02/04/2023). You can have the follow up appointment with Dr. Dellis Anes or a Nurse Practicioner (our Nurse Practitioners are excellent and always have Physician oversight!).    This note in its entirety was forwarded to the Provider who requested this consultation.  Subjective:   Natalie Cardenas is a 47 y.o. female presenting today for evaluation of  Chief Complaint  Patient presents with   New Patient (Initial Visit)    No concerns    Natalie Cardenas has a history of the following: Patient Active Problem List   Diagnosis Date Noted   Colon cancer screening 12/18/2022   Chronic idiopathic constipation 12/17/2022   FH: esophageal cancer 12/17/2022   Hoarseness 12/17/2022   Chronic throat clearing 12/17/2022   GERD (gastroesophageal reflux disease) 12/17/2022   Dysphagia 12/17/2022   Laryngitis 10/22/2022   Prediabetes 07/02/2022   Reactive hypoglycemia 07/02/2022   Mixed hyperlipidemia 07/02/2022   Precordial chest pain 05/20/2022   Type 2 diabetes mellitus with complication, without long-term current use of insulin (HCC) 05/20/2022   DOE (dyspnea on exertion) 05/14/2022   Cough variant asthma vs UACS 02/19/2022   Cervical radiculopathy at C6 03/12/2021   Breast cancer (HCC) 04/29/2020   Genetic testing 01/14/2020   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  Natalie Cardenas was referred by Avis Epley, PA-C.     Natalie Cardenas is a 47 y.o. female presenting for an evaluation of hoarseness and concern for environmental allergies .  She reports that she has  SOB in conjunction with a scratchy throat and voice changes. When her throat becomes "inflamed", it lasts for months. Current stint started in May and now she is finally getting better. She has some hoarseness and has to talk in a whisper. She is feeling fairly normal right now with chest pain and SOB.  She remembers that in November 2018, she felt like  she could not breathe and her chest was hurting. She was thinking it might be bronchitis. She was diagnosed with this and was placed on something. She was sick throughout the winter.  She did have breast cancer two years ago. She had a lot of nerve pain during that time and she ignored everything else going on. The last two years have been focused on healing and getting over the breast cancer. She had a bilateral mastectomy and no chemo or radiation at all. Theu surgery was curative.   She has been exercising and has not been doing the things that she needs to do. She started working out more in the spring and had some chest tightness.  She is seeing Pulmonology and she was trialed on a number of inhalers; nothing changed. She has seen Cardiology and ENT, GI, and Endo. She has been cleared by everyone. GI is going to be doing an endoscopy. This is not scheduled. She has been on reflux medications and this did not help at all.   Prednisone is the only thing that seems to work.  Symbicort has not been helpful.  GERD treatment has not been helpful.  PFTs have been normal in the past.  Dr. Sherene Sires felt that this is related to vocal cord dysfunction or upper airway cough syndrome.  Allergic Rhinitis Symptom History: She denies any postnasal drip. She does get sinus symptoms and get an infection once per year. She has been prednisone throughout the entire summer. When she feels that this is a sinus infection, within 24 hours, she feels that she is feeling better.   She did have a nasal rhinoscopy that was normal.  This was done last week.  She has anxiety, but this did not start until AFTER her symptoms started.   Otherwise, there is no history of other atopic diseases, including drug allergies, stinging insect allergies, or contact dermatitis. There is no significant infectious history. Vaccinations are up to date.    Past Medical History: Patient Active Problem List   Diagnosis Date Noted   Colon  cancer screening 12/18/2022   Chronic idiopathic constipation 12/17/2022   FH: esophageal cancer 12/17/2022   Hoarseness 12/17/2022   Chronic throat clearing 12/17/2022   GERD (gastroesophageal reflux disease) 12/17/2022   Dysphagia 12/17/2022   Laryngitis 10/22/2022   Prediabetes 07/02/2022   Reactive hypoglycemia 07/02/2022   Mixed hyperlipidemia 07/02/2022   Precordial chest pain 05/20/2022   Type 2 diabetes mellitus with complication, without long-term current use of insulin (HCC) 05/20/2022   DOE (dyspnea on exertion) 05/14/2022   Cough variant asthma vs UACS 02/19/2022   Cervical radiculopathy at C6 03/12/2021   Breast cancer (HCC) 04/29/2020   Genetic testing 01/14/2020   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   Other Rash   Chlorahexadine wipe/soap        Medication List  Accurate as of January 07, 2023  2:27 PM. If you have any questions, ask your nurse or doctor.          STOP taking these medications    promethazine-dextromethorphan 6.25-15 MG/5ML syrup Commonly known as: PROMETHAZINE-DM Stopped by: Alfonse Spruce       TAKE these medications    acetaminophen 500 MG tablet Commonly known as: TYLENOL Take 500 mg by mouth every 6 (six) hours as needed.   albuterol 108 (90 Base) MCG/ACT inhaler Commonly known as: VENTOLIN HFA   benzonatate 200 MG capsule Commonly known as: TESSALON Take 1 capsule (200 mg total) by mouth 3 (three) times daily as needed for cough.   clonazePAM 0.5 MG tablet Commonly known as: KLONOPIN Take 1 tablet (0.5 mg total) by mouth 2 (two) times daily as needed.   ibuprofen 200 MG tablet Commonly known as: ADVIL Take 800 mg by mouth daily. Current dose   pantoprazole 40 MG tablet Commonly known as: PROTONIX Take  1 tablet (40 mg total) by mouth 2 (two) times daily before a meal. Take 30-60 min before first meal of the day   PRENATAL VITAMIN PO Take 1 tablet by mouth at bedtime.   sennosides-docusate sodium 8.6-50 MG tablet Commonly known as: SENOKOT-S Take 3 tablets by mouth at bedtime.   Trulance 3 MG Tabs Generic drug: Plecanatide Take 1 tablet (3 mg total) by mouth daily.        Birth History: non-contributory  Developmental History: non-contributory  Past Surgical History: Past Surgical History:  Procedure Laterality Date   BREAST BIOPSY Right 12/28/2019   x2   BREAST BIOPSY Left 02/01/2020   BREAST RECONSTRUCTION WITH PLACEMENT OF TISSUE EXPANDER AND FLEX HD (ACELLULAR HYDRATED DERMIS) Bilateral 04/29/2020   Procedure: IMMEDIATE BILATERAL BREAST RECONSTRUCTION WITH PLACEMENT OF TISSUE EXPANDER AND FLEX HD (ACELLULAR HYDRATED DERMIS);  Surgeon: Allena Napoleon, MD;  Location: [facility];  Service: Plastics;  Laterality: Bilateral;   NIPPLE SPARING MASTECTOMY WITH SENTINEL LYMPH NODE BIOPSY Bilateral 04/29/2020   Procedure: BILATERAL NIPPLE SPARING MASTECTOMIES WITH RIGHT AXILLARY SENTINEL LYMPH NODE BIOPSY;  Surgeon: Emelia Loron, MD;  Location: [facility];  Service: General;  Laterality: Bilateral;  BILATERAL PEC BLOCK, RNFA   REMOVAL OF BILATERAL TISSUE EXPANDERS WITH PLACEMENT OF BILATERAL BREAST IMPLANTS Bilateral 06/23/2020   Procedure: REMOVAL OF BILATERAL TISSUE EXPANDERS WITH PLACEMENT OF BILATERAL BREAST IMPLANTS;  Surgeon: Allena Napoleon, MD;  Location: [facility];  Service: Plastics;  Laterality: Bilateral;  1.5 hours   TONSILLECTOMY       Family History: Family History  Problem Relation Age of Onset   Diabetes Mother    Heart failure Mother    Asthma Mother    Cancer Father 55       esophageal cancer   Diabetes Maternal Grandmother    Brain cancer Paternal Grandmother    Cancer Paternal Grandfather         esophageal cancer      Social History: Kynzleigh lives at home with her family.  They live in a house that is 49 years old.  There is hardwood throughout the home.  She has gas and electric heating as well as a heat pump.  There is a dog inside of the home, who has been with the family for 3 years.  Notably, the dog entered the picture after the onset of the hoarseness.  She also has has had dogs at various times in the past.  She works as a Armed forces operational officer.  It seems to be a little bit worse there.  She does report some mold in the office.  There is no HEPA filter in the home.  They do not [place] near an interstate or industrial area.   Review of systems otherwise negative other than that mentioned in the HPI.    Objective:   Blood pressure 120/70, pulse 91, temperature 98.6 F (37 C), height 5' 4.96" (1.65 m), weight 155 lb 12.8 oz (70.7 kg), SpO2 97%. Body mass index is 25.96 kg/m.     Physical Exam Vitals reviewed.  Constitutional:      Appearance: She is well-developed.  HENT:     Head: Normocephalic and atraumatic.     Right Ear: Tympanic membrane, ear canal and external ear normal. No drainage, swelling or tenderness. Tympanic membrane is not injected, scarred, erythematous, retracted or bulging.     Left Ear: Tympanic membrane, ear canal and external ear normal. No drainage, swelling or tenderness. Tympanic membrane is not injected, scarred, erythematous, retracted or bulging.     Nose: No nasal deformity, septal deviation, mucosal edema or rhinorrhea.     Right Turbinates: Enlarged, swollen and pale.     Left Turbinates: Enlarged, swollen and pale.     Right Sinus: No maxillary sinus tenderness or frontal sinus tenderness.     Left Sinus: No maxillary sinus tenderness or frontal sinus tenderness.     Comments: No nasal polyps.    Mouth/Throat:     Lips: Pink.     Mouth: Mucous membranes are moist. Mucous membranes are not pale and not dry.     Pharynx: Uvula midline.      Comments: Mild cobblestoning. Eyes:     General:        Right eye: No discharge.        Left eye: No discharge.     Conjunctiva/sclera: Conjunctivae normal.     Right eye: Right conjunctiva is not injected. No chemosis.    Left eye: Left conjunctiva is not injected. No chemosis.    Pupils: Pupils are equal, round, and reactive to light.  Cardiovascular:     Rate and Rhythm: Normal rate and regular rhythm.     Heart sounds: Normal heart sounds.  Pulmonary:     Effort: Pulmonary effort is normal. No tachypnea, accessory muscle usage or respiratory distress.     Breath sounds: Normal breath sounds. No wheezing, rhonchi or rales.  Chest:     Chest wall: No tenderness.  Abdominal:     Tenderness: There is no abdominal tenderness. There is no guarding or rebound.  Lymphadenopathy:     Head:     Right side of head: No submandibular, tonsillar or occipital adenopathy.     Left side of head: No submandibular, tonsillar or occipital adenopathy.     Cervical: No cervical adenopathy.  Skin:    Coloration: Skin is not pale.     Findings: No abrasion, erythema, petechiae or rash. Rash is not papular, urticarial or vesicular.  Neurological:     Mental Status: She is alert.  Psychiatric:        Behavior: Behavior is cooperative.      Diagnostic studies:    Allergy Studies:     Airborne Adult Perc - 01/07/23 1101     Time Antigen Placed 1101    Allergen Manufacturer Waynette Buttery    Location Back    Number of Test 55    1. Control-Buffer 50% Glycerol  Negative    2. Control-Histamine 2+    3. Bahia Negative    4. French Southern Territories Negative    5. Johnson Negative    6. Kentucky Blue Negative    7. Meadow Fescue Negative    8. Perennial Rye Negative    9. Timothy Negative    10. Ragweed Mix Negative    11. Cocklebur Negative    12. Plantain,  English Negative    13. Baccharis Negative    14. Dog Fennel Negative    15. Russian Thistle Negative    16. Lamb's Quarters Negative    17. Sheep  Sorrell Negative    18. Rough Pigweed Negative    19. Marsh Elder, Rough Negative    20. Mugwort, Common Negative    21. Box, Elder Negative    22. Cedar, red Negative    23. Sweet Gum Negative    24. Pecan Pollen 2+    25. Pine Mix 2+    26. Walnut, Black Pollen Negative    27. Red Mulberry Negative    28. Ash Mix Negative    29. Birch Mix Negative    30. Beech American Negative    31. Cottonwood, Guinea-Bissau Negative    32. Hickory, White Negative    33. Maple Mix Negative    34. Oak, Guinea-Bissau Mix Negative    35. Sycamore Eastern Negative    36. Alternaria Alternata Negative    37. Cladosporium Herbarum Negative    38. Aspergillus Mix Negative    39. Penicillium Mix Negative    40. Bipolaris Sorokiniana (Helminthosporium) Negative    41. Drechslera Spicifera (Curvularia) Negative    42. Mucor Plumbeus Negative    43. Fusarium Moniliforme Negative    44. Aureobasidium Pullulans (pullulara) Negative    45. Rhizopus Oryzae Negative    46. Botrytis Cinera Negative    47. Epicoccum Nigrum Negative    48. Phoma Betae Negative    49. Dust Mite Mix 4+    50. Cat Hair 10,000 BAU/ml 4+    51.  Dog Epithelia Negative    52. Mixed Feathers Negative    53. Horse Epithelia Negative    54. Cockroach, German Negative    55. Tobacco Leaf Negative             Intradermal - 01/07/23 1118     Time Antigen Placed 1400    Allergen Manufacturer Waynette Buttery    Location Arm    Control Negative    Bahia Negative    French Southern Territories Negative    Johnson Negative    7 Grass Negative    Ragweed Mix Negative    Weed Mix Negative    Tree Mix Negative    Mold 1 Negative    Mold 2 1+    Mold 3 2+    Mold 4 2+    Dog 4+    Cockroach Negative             Allergy testing results were read and interpreted by myself, documented by clinical staff.         Malachi Bonds, MD [facility]

## 2023-01-07 NOTE — Patient Instructions (Addendum)
1. Hoarseness - I think that this could be related to postnasal drip. - We will try to manage this aggressively and see how this works.   2. Seasonal and perennial allergic rhinitis - Testing today showed: trees, indoor molds, outdoor molds, dust mites, cat, and dog - Copy of test results provided.  - Avoidance measures provided. - Definitely focus on DUST MITE coverings and get RID OF THE humidifier.  - Get a HEPA filter for the bedroom at least to decrease dander loads in that room.  - Get a mold kit from [employer] to check your workplace environment.  - Continue with: Allegra (fexofenadine) 180mg  tablet once daily - Start taking: Singulair (montelukast) 10mg  daily and Ryaltris (olopatadine/mometasone) two sprays per nostril 1-2 times daily as needed - Singulair can cause irritability and bad dreams, so beware of this and stop it if this becomes a problem.  - You can use an extra dose of the antihistamine, if needed, for breakthrough symptoms.  - Consider nasal saline rinses 1-2 times daily to remove allergens from the nasal cavities as well as help with mucous clearance (this is especially helpful to do before the nasal sprays are given) - Consider allergy shots as a means of long-term control. - Allergy shots "re-train" and "reset" the immune system to ignore environmental allergens and decrease the resulting immune response to those allergens (sneezing, itchy watery eyes, runny nose, nasal congestion, etc).    - Allergy shots improve symptoms in 75-85% of patients.  - We can discuss more at the next appointment if the medications are not working for you.  3. Return in about 4 weeks (around 02/04/2023). You can have the follow up appointment with Dr. Dellis Anes or a Nurse Practicioner (our Nurse Practitioners are excellent and always have Physician oversight!).    Please inform us of any Emergency Department visits, hospitalizations, or changes in symptoms. Call us before going to the ED for  breathing or allergy symptoms since we might be able to fit you in for a sick visit. Feel free to contact us anytime with any questions, problems, or concerns.  It was a pleasure to meet you today!  Websites that have reliable patient information: 1. American Academy of Asthma, Allergy, and Immunology: www.aaaai.org 2. Food Allergy Research and Education (FARE): foodallergy.org 3. Mothers of Asthmatics: http://www.asthmacommunitynetwork.org 4. American College of Allergy, Asthma, and Immunology: www.acaai.org   COVID-19 Vaccine Information can be found at: [email or web address] For questions related to vaccine distribution or appointments, please email [email or web address] or call [phone].     "Like" [place] on Facebook and Instagram for our latest updates!      A healthy democracy works best when [employer] participate! Make sure you are registered to vote! If you have moved or changed any of your contact information, you will need to get this updated before voting! Scan the QR codes below to learn more!           Airborne Adult Perc - 01/07/23 1101     Time Antigen Placed 1101    Allergen Manufacturer Waynette Buttery    Location Back    Number of Test 55    1. Control-Buffer 50% Glycerol Negative    2. Control-Histamine 2+    3. Bahia Negative    4. [place] Negative    5. Johnson Negative    6. Kentucky Blue Negative    7. Meadow Fescue Negative    8. Perennial Rye Negative    9. Timothy Negative    10.  Ragweed Mix Negative    11. Cocklebur Negative    12. Plantain,  English Negative    13. Baccharis Negative    14. Dog Fennel Negative    15. Russian Thistle Negative    16. Lamb's Quarters Negative    17. Sheep Sorrell Negative    18. Rough Pigweed Negative    19. Marsh Elder, Rough Negative    20. Mugwort, Common Negative    21. Box, Elder Negative    22. Cedar, red Negative    23. Sweet Gum Negative    24.  Pecan Pollen 2+    25. Pine Mix 2+    26. Walnut, Black Pollen Negative    27. Red Mulberry Negative    28. Ash Mix Negative    29. Birch Mix Negative    30. Beech American Negative    31. Cottonwood, Guinea-Bissau Negative    32. Hickory, White Negative    33. Maple Mix Negative    34. Oak, Guinea-[place] Mix Negative    35. Sycamore Eastern Negative    36. Alternaria Alternata Negative    37. Cladosporium Herbarum Negative    38. Aspergillus Mix Negative    39. Penicillium Mix Negative    40. Bipolaris Sorokiniana (Helminthosporium) Negative    41. Drechslera Spicifera (Curvularia) Negative    42. Mucor Plumbeus Negative    43. Fusarium Moniliforme Negative    44. Aureobasidium Pullulans (pullulara) Negative    45. Rhizopus Oryzae Negative    46. Botrytis Cinera Negative    47. Epicoccum Nigrum Negative    48. Phoma Betae Negative    49. Dust Mite Mix 4+    50. Cat Hair 10,000 BAU/ml 4+    51.  Dog Epithelia Negative    52. Mixed Feathers Negative    53. Horse Epithelia Negative    54. Cockroach, German Negative    55. Tobacco Leaf Negative             Intradermal - 01/07/23 1118     Time Antigen Placed 1400    Allergen Manufacturer Waynette Buttery    Location Arm    Control Negative    Bahia Negative    French [place] Territories Negative    Johnson Negative    7 Grass Negative    Ragweed Mix Negative    Weed Mix Negative    Tree Mix Negative    Mold 1 Negative    Mold 2 1+    Mold 3 2+    Mold 4 2+    Dog 4+    Cockroach Negative            Reducing Pollen Exposure  The American Academy of Allergy, Asthma and Immunology suggests the following steps to reduce your exposure to pollen during allergy seasons.    Do not hang sheets or clothing out to dry; pollen may collect on these items. Do not mow lawns or spend time around freshly cut grass; mowing stirs up pollen. Keep windows closed at night.  Keep car windows closed while driving. Minimize morning activities outdoors, a time when  pollen counts are usually at their highest. Stay indoors as much as possible when pollen counts or humidity is high and on windy days when pollen tends to remain in the air longer. Use air conditioning when possible.  Many air conditioners have filters that trap the pollen spores. Use a HEPA room air filter to remove pollen form the indoor air you breathe.  Control of Mold Allergen  Mold and fungi can grow on a variety of surfaces provided certain temperature and moisture conditions exist.  Outdoor molds grow on plants, decaying vegetation and soil.  The major outdoor mold, Alternaria and Cladosporium, are found in very high numbers during hot and dry conditions.  Generally, a late Summer - Fall peak is seen for common outdoor fungal spores.  Rain will temporarily lower outdoor mold spore count, but counts rise rapidly when the rainy period ends.  The most important indoor molds are Aspergillus and Penicillium.  Dark, humid and poorly ventilated basements are ideal sites for mold growth.  The next most common sites of mold growth are the bathroom and the kitchen.  Outdoor (Seasonal) Mold Control  Positive outdoor molds via skin testing: Bipolaris (Helminthsporium), Drechslera (Curvalaria), and Mucor  Use air conditioning and keep windows closed Avoid exposure to decaying vegetation. Avoid leaf raking. Avoid grain handling. Consider wearing a face mask if working in moldy areas.    Indoor (Perennial) Mold Control   Positive indoor molds via skin testing: Aspergillus, Penicillium, Fusarium, Aureobasidium (Pullulara), and Rhizopus  Maintain humidity below 50%. Clean washable surfaces with 5% bleach solution. Remove sources e.g. contaminated carpets.    Control of Dog or Cat Allergen  Avoidance is the best way to manage a dog or cat allergy. If you have a dog or cat and are allergic to dog or cats, consider removing the dog or cat from the home. If you have a dog or cat but don't want  to find it a new home, or if your family wants a pet even though someone in the household is allergic, here are some strategies that may help keep symptoms at bay:  Keep the pet out of your bedroom and restrict it to only a few rooms. Be advised that keeping the dog or cat in only one room will not limit the allergens to that room. Don't pet, hug or kiss the dog or cat; if you do, wash your hands with soap and water. High-efficiency particulate air (HEPA) cleaners run continuously in a bedroom or living room can reduce allergen levels over time. Regular use of a high-efficiency vacuum cleaner or a central vacuum can reduce allergen levels. Giving your dog or cat a bath at least once a week can reduce airborne allergen.  Allergy Shots  Allergies are the result of a chain reaction that starts in the immune system. Your immune system controls how your body defends itself. For instance, if you have an allergy to pollen, your immune system identifies pollen as an invader or allergen. Your immune system overreacts by producing antibodies called Immunoglobulin E (IgE). These antibodies travel to cells that release chemicals, causing an allergic reaction.  The concept behind allergy immunotherapy, whether it is received in the form of shots or tablets, is that the immune system can be desensitized to specific allergens that trigger allergy symptoms. Although it requires time and patience, the payback can be long-term relief. Allergy injections contain a dilute solution of those substances that you are allergic to based upon your skin testing and allergy history.   How Do Allergy Shots Work?  Allergy shots work much like a vaccine. Your body responds to injected amounts of a particular allergen given in increasing doses, eventually developing a resistance and tolerance to it. Allergy shots can lead to decreased, minimal or no allergy symptoms.  There generally are two phases: build-up and maintenance.  Build-up often ranges from three to six months and involves receiving  injections with increasing amounts of the allergens. The shots are typically given once or twice a week, though more rapid build-up schedules are sometimes used.  The maintenance phase begins when the most effective dose is reached. This dose is different for each person, depending on how allergic you are and your response to the build-up injections. Once the maintenance dose is reached, there are longer periods between injections, typically two to four weeks.  Occasionally doctors give cortisone-type shots that can temporarily reduce allergy symptoms. These types of shots are different and should not be confused with allergy immunotherapy shots.  Who Can Be Treated with Allergy Shots?  Allergy shots may be a good treatment approach for people with allergic rhinitis (hay fever), allergic asthma, conjunctivitis (eye allergy) or stinging insect allergy.   Before deciding to begin allergy shots, you should consider:   The length of allergy season and the severity of your symptoms  Whether medications and/or changes to your environment can control your symptoms  Your desire to avoid long-term medication use  Time: allergy immunotherapy requires a major time commitment  Cost: may vary depending on your insurance coverage  Allergy shots for children age 50 and older are effective and often well tolerated. They might prevent the onset of new allergen sensitivities or the progression to asthma.  Allergy shots are not started on patients who are pregnant but can be continued on patients who become pregnant while receiving them. In some patients with other medical conditions or who take certain common medications, allergy shots may be of risk. It is important to mention other medications you talk to your allergist.   What are the two types of build-ups offered:   RUSH or Rapid Desensitization -- one day of injections lasting from  8:30-4:30pm, injections every 1 hour.  Approximately half of the build-up process is completed in that one day.  The following week, normal build-up is resumed, and this entails ~16 visits either weekly or twice weekly, until reaching your "maintenance dose" which is continued weekly until eventually getting spaced out to every month for a duration of 3 to 5 years. The regular build-up appointments are nurse visits where the injections are administered, followed by required monitoring for 30 minutes.    Traditional build-up -- weekly visits for 6 -12 months until reaching "maintenance dose", then continue weekly until eventually spacing out to every 4 weeks as above. At these appointments, the injections are administered, followed by required monitoring for 30 minutes.     Either way is acceptable, and both are equally effective. With the rush protocol, the advantage is that less time is spent here for injections overall AND you would also reach maintenance dosing faster (which is when the clinical benefit starts to become more apparent). Not everyone is a candidate for rapid desensitization.   IF we proceed with the RUSH protocol, there are premedications which must be taken the day before and the day after the rush only (this includes antihistamines, steroids, and Singulair).  After the rush day, no prednisone or Singulair is required, and we just recommend antihistamines taken on your injection day.  What Is An Estimate of the Costs?  If you are interested in starting allergy injections, please check with your insurance company about your coverage for both allergy vial sets and allergy injections.  Please do so prior to making the appointment to start injections.  The following are CPT codes to give to your insurance company. These are the amounts we BILL to  the insurance company, but the amount YOU WILL PAY and WE RECEIVE IS SUBSTANTIALLY LESS and depends on the contracts we have with different  insurance companies.   Amount Billed to Insurance One allergy vial set  CPT 95165   $ 1200     Two allergy vial set  CPT 95165   $ 2400     Three allergy vial set  CPT 95165   $ 3600     One injection   CPT 95115   $ 35  Two injections   CPT 95117   $ 40 RUSH (Rapid Desensitization) CPT 95180 x 8 hours $500/hour  Regarding the allergy injections, your co-pay may or may not apply with each injection, so please confirm this with your insurance company. When you start allergy injections, 1 or 2 sets of vials are made based on your allergies.  Not all patients can be on one set of vials. A set of vials lasts 6 months to a year depending on how quickly you can proceed with your build-up of your allergy injections. Vials are personalized for each patient depending on their specific allergens.  How often are allergy injection given during the build-up period?   Injections are given at least weekly during the build-up period until your maintenance dose is achieved. Per the doctor's discretion, you may have the option of getting allergy injections two times per week during the build-up period. However, there must be at least 48 hours between injections. The build-up period is usually completed within 6-12 months depending on your ability to schedule injections and for adjustments for reactions. When maintenance dose is reached, your injection schedule is gradually changed to every two weeks and later to every three weeks. Injections will then continue every 4 weeks. Usually, injections are continued for a total of 3-5 years.   When Will I Feel Better?  Some may experience decreased allergy symptoms during the build-up phase. For others, it may take as long as 12 months on the maintenance dose. If there is no improvement after a year of maintenance, your allergist will discuss other treatment options with you.  If you aren't responding to allergy shots, it may be because there is not enough dose of the  allergen in your vaccine or there are missing allergens that were not identified during your allergy testing. Other reasons could be that there are high levels of the allergen in your environment or major exposure to non-allergic triggers like tobacco smoke.  What Is the Length of Treatment?  Once the maintenance dose is reached, allergy shots are generally continued for three to five years. The decision to stop should be discussed with your allergist at that time. Some people may experience a permanent reduction of allergy symptoms. Others may relapse and a longer course of allergy shots can be considered.  What Are the Possible Reactions?  The two types of adverse reactions that can occur with allergy shots are local and systemic. Common local reactions include very mild redness and swelling at the injection site, which can happen immediately or several hours after. Report a delayed reaction from your last injection. These include arm swelling or runny nose, watery eyes or cough that occurs within 12-24 hours after injection. A systemic reaction, which is less common, affects the entire body or a particular body system. They are usually mild and typically respond quickly to medications. Signs include increased allergy symptoms such as sneezing, a stuffy nose or hives.   Rarely, a serious systemic  reaction called anaphylaxis can develop. Symptoms include swelling in the throat, wheezing, a feeling of tightness in the chest, nausea or dizziness. Most serious systemic reactions develop within 30 minutes of allergy shots. This is why it is strongly recommended you wait in your doctor's office for 30 minutes after your injections. Your allergist is trained to watch for reactions, and his or her staff is trained and equipped with the proper medications to identify and treat them.   Report to the nurse immediately if you experience any of the following symptoms: swelling, itching or redness of the skin,  hives, watery eyes/nose, breathing difficulty, excessive sneezing, coughing, stomach pain, diarrhea, or light headedness. These symptoms may occur within 15-20 minutes after injection and may require medication.   Who Should Administer Allergy Shots?  The preferred location for receiving shots is your prescribing allergist's office. Injections can sometimes be given at another facility where the physician and staff are trained to recognize and treat reactions, and have received instructions by your prescribing allergist.  What if I am late for an injection?   Injection dose will be adjusted depending upon how many days or weeks you are late for your injection.   What if I am sick?   Please report any illness to the nurse before receiving injections. She may adjust your dose or postpone injections depending on your symptoms. If you have fever, flu, sinus infection or chest congestion it is best to postpone allergy injections until you are better. Never get an allergy injection if your asthma is causing you problems. If your symptoms persist, seek out medical care to get your health problem under control.  What If I am or Become Pregnant:  Women that become pregnant should schedule an appointment with [facility] before receiving any further allergy injections.

## 2023-01-07 NOTE — Addendum Note (Signed)
Addended by: Alfonse Spruce on: 01/07/2023 02:36 PM   Modules accepted: Orders

## 2023-01-10 ENCOUNTER — Other Ambulatory Visit: Payer: Self-pay

## 2023-01-10 MED ORDER — RYALTRIS 665-25 MCG/ACT NA SUSP: 2.0000 | Freq: Every day | NASAL | 5 refills | Status: DC

## 2023-01-28 ENCOUNTER — Encounter: Payer: Self-pay | Admitting: *Deleted

## 2023-01-28 NOTE — Telephone Encounter (Signed)
Letter mailed and sent my chart message.

## 2023-02-04 ENCOUNTER — Other Ambulatory Visit: Payer: Self-pay

## 2023-02-04 ENCOUNTER — Ambulatory Visit (HOSPITAL_COMMUNITY)
Admission: RE | Admit: 2023-02-04 | Discharge: 2023-02-04 | Disposition: A | Discharge status: Home / Self Care | Payer: Managed Care, Other (non HMO) | Source: Ambulatory Visit | Attending: "Endocrinology | Admitting: "Endocrinology

## 2023-02-04 ENCOUNTER — Encounter: Payer: Self-pay | Admitting: Allergy & Immunology

## 2023-02-04 ENCOUNTER — Telehealth: Payer: Self-pay | Admitting: Allergy & Immunology

## 2023-02-04 ENCOUNTER — Ambulatory Visit: Payer: Managed Care, Other (non HMO) | Admitting: Allergy & Immunology

## 2023-02-04 VITALS — BP 120/80 | HR 86 | Temp 97.9°F | Resp 16 | Ht 64.96 in | Wt 159.6 lb

## 2023-02-04 DIAGNOSIS — J3089 Other allergic rhinitis: Secondary | ICD-10-CM | POA: Diagnosis not present

## 2023-02-04 DIAGNOSIS — R490 Dysphonia: Secondary | ICD-10-CM

## 2023-02-04 DIAGNOSIS — R053 Chronic cough: Secondary | ICD-10-CM

## 2023-02-04 DIAGNOSIS — J302 Other seasonal allergic rhinitis: Secondary | ICD-10-CM | POA: Diagnosis not present

## 2023-02-04 DIAGNOSIS — E049 Nontoxic goiter, unspecified: Secondary | ICD-10-CM | POA: Diagnosis present

## 2023-02-04 MED ORDER — METHYLPREDNISOLONE ACETATE 40 MG/ML IJ SUSP
40.0000 mg | Freq: Once | INTRAMUSCULAR | Status: AC
Start: 2023-02-04 — End: 2023-02-04
  Administered 2023-02-04: 40 mg via INTRAMUSCULAR

## 2023-02-04 NOTE — Telephone Encounter (Signed)
ERROR

## 2023-02-04 NOTE — Progress Notes (Signed)
FOLLOW UP  Date of Service/Encounter:  02/04/23   Assessment:   Hoarseness - multifactorial   Seasonal and perennial allergic rhinitis (trees, indoor molds, outdoor molds, dust mites, cat, and dog) - addressing with medication management for now  Chronic cough - with normal spirometry  GERD - on a PPI (followed by Tana Coast PA at [facility]  Plan/Recommendations:   1. Hoarseness - I think that this could be related to postnasal drip, but clearly there are some other factors going on. - I agree with doing the endoscopy to see what is going on down there.  2. Seasonal and perennial allergic rhinitis - Previous testing showed: trees, indoor molds, outdoor molds, dust mites, cat, and dog - DepoMedrol injection provided.  - Continue with: Allegra (fexofenadine) 180mg  tablet once daily - Continue with: Singulair (montelukast) 10mg  daily and Ryaltris (olopatadine/mometasone) two sprays per nostril 1-2 times daily as needed - Singulair can cause irritability and bad dreams, so beware of this and stop it if this becomes a problem.  - You can use an extra dose of the antihistamine, if needed, for breakthrough symptoms.  - Consider nasal saline rinses 1-2 times daily to remove allergens from the nasal cavities as well as help with mucous clearance (this is especially helpful to do before the nasal sprays are given) - Consider allergy shots as a means of long-term control. - Allergy shots "re-train" and "reset" the immune system to ignore environmental allergens and decrease the resulting immune response to those allergens (sneezing, itchy watery eyes, runny nose, nasal congestion, etc).    - Allergy shots improve symptoms in 75-85% of patients.  - If you decide to start it, I would do it before your deductible resets.   3. Return in about 3 months (around 05/07/2023).   Subjective:   Natalie Cardenas is a 47 y.o. female presenting today for follow up of  Chief Complaint   Patient presents with   Follow-up    Hoarseness is still bothersome and scratchy Chest pain shortness of breath going up and down stairs.    Cough    Natalie Cardenas has a history of the following: Patient Active Problem List   Diagnosis Date Noted   Colon cancer screening 12/18/2022   Chronic idiopathic constipation 12/17/2022   FH: esophageal cancer 12/17/2022   Hoarseness 12/17/2022   Chronic throat clearing 12/17/2022   GERD (gastroesophageal reflux disease) 12/17/2022   Dysphagia 12/17/2022   Laryngitis 10/22/2022   Prediabetes 07/02/2022   Reactive hypoglycemia 07/02/2022   Mixed hyperlipidemia 07/02/2022   Precordial chest pain 05/20/2022   Type 2 diabetes mellitus with complication, without long-term current use of insulin (HCC) 05/20/2022   DOE (dyspnea on exertion) 05/14/2022   Cough variant asthma vs UACS 02/19/2022   Cervical radiculopathy at C6 03/12/2021   Breast cancer (HCC) 04/29/2020   Genetic testing 01/14/2020   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  Discussed the use of AI scribe software for clinical note transcription with the patient and/or guardian, who gave verbal consent to proceed.  Natalie Cardenas is a 47 y.o. female presenting for a follow up visit.  He was last seen in September 2023.  At that time, we thought that her hoarseness was related to postnasal drip.  She had testing that was positive to multiple indoor and outdoor allergens.  We recommended focusing on dust mite coverings and getting rid of the humidifier.  We  recommended getting a HEPA filter for the bedroom.  We also recommended that she get a mold kit from [place] to check for her workplace environment.  We continue with Allegra 1 tablet daily and started her on Singulair as well as Ryaltris.  We did talk about allergy shots for long-term control.  Since last visit, she has done slightly better.   The patient, with a history  of chronic hoarseness, chest pain, and shortness of breath, reports a fluctuating course of symptoms with 'good days and bad days.' She describes a persistent sensation of discomfort in the chest and throat, accompanied by a scratchy voice that intermittently worsens. Despite some improvement, the patient does not feel at her best and continues to experience low energy levels.  The onset of these symptoms prompted a series of investigations with cardiology, pulmonology as well as [place]. The patient has been on Protonix (pantoprazole) for several months for suspected acid reflux. An endoscopy has been discussed but not yet scheduled due to ongoing appointments with other specialists. An in-office rhinoscopy by an ENT specialist did not reveal any significant findings. An ultrasound of the thyroid was also unremarkable.  The patient has been managing her symptoms with Singulair and Allegra, taken nightly, and a nasal spray. She reports some improvement with this regimen, but the relief is inconsistent. The patient also notes that her symptoms seem to improve when she is at the beach, suggesting a possible environmental component to her condition.  The patient has been on a course of antibiotics and steroids, which provided temporary relief, particularly from sinus pressure. However, the patient expresses concern about the frequency of steroid use. She has also been using cough drops and sugar-free candies to maintain oral moisture, as advised by her pulmonologist.  The patient has taken steps to manage potential allergens in her environment, including using dust mite covers and planning to purchase a HEPA filter for her home. She has also ordered a mold testing kit for her workplace due to concerns about potential mold exposure.   She is open to allergy shots and wants to learn more about those.  Otherwise, there have been no changes to her past medical history, surgical history, family history, or social  history.    Review of systems otherwise negative other than that mentioned in the HPI.    Objective:   Blood pressure 120/80, pulse 86, temperature 97.9 F (36.6 C), resp. rate 16, height 5' 4.96" (1.65 m), weight 159 lb 9.6 oz (72.4 kg), SpO2 96%. Body mass index is 26.59 kg/m.    Physical Exam Vitals reviewed.  Constitutional:      Appearance: She is well-developed.  HENT:     Head: Normocephalic and atraumatic.     Right Ear: Tympanic membrane, ear canal and external ear normal. No drainage, swelling or tenderness. Tympanic membrane is not injected, scarred, erythematous, retracted or bulging.     Left Ear: Tympanic membrane, ear canal and external ear normal. No drainage, swelling or tenderness. Tympanic membrane is not injected, scarred, erythematous, retracted or bulging.     Nose: No nasal deformity, septal deviation, mucosal edema or rhinorrhea.     Right Turbinates: Enlarged, swollen and pale.     Left Turbinates: Enlarged, swollen and pale.     Right Sinus: No maxillary sinus tenderness or frontal sinus tenderness.     Left Sinus: No maxillary sinus tenderness or frontal sinus tenderness.     Comments: No nasal polyps.    Mouth/Throat:  Lips: Pink.     Mouth: Mucous membranes are moist. Mucous membranes are not pale and not dry.     Pharynx: Uvula midline.     Comments: Mild cobblestoning. Eyes:     General:        Right eye: No discharge.        Left eye: No discharge.     Conjunctiva/sclera: Conjunctivae normal.     Right eye: Right conjunctiva is not injected. No chemosis.    Left eye: Left conjunctiva is not injected. No chemosis.    Pupils: Pupils are equal, round, and reactive to light.  Cardiovascular:     Rate and Rhythm: Normal rate and regular rhythm.     Heart sounds: Normal heart sounds.  Pulmonary:     Effort: Pulmonary effort is normal. No tachypnea, accessory muscle usage or respiratory distress.     Breath sounds: Normal breath sounds. No  wheezing, rhonchi or rales.     Comments: Moving air well in all lung fields.  No increased work of breathing. Chest:     Chest wall: No tenderness.  Abdominal:     Tenderness: There is no abdominal tenderness. There is no guarding or rebound.  Lymphadenopathy:     Head:     Right side of head: No submandibular, tonsillar or occipital adenopathy.     Left side of head: No submandibular, tonsillar or occipital adenopathy.     Cervical: No cervical adenopathy.  Skin:    General: Skin is warm.     Capillary Refill: Capillary refill takes less than 2 seconds.     Coloration: Skin is not pale.     Findings: No abrasion, erythema, petechiae or rash. Rash is not papular, urticarial or vesicular.     Comments: No eczematous or urticarial lesions noted.  Neurological:     Mental Status: She is alert.  Psychiatric:        Behavior: Behavior is cooperative.      Diagnostic studies:    Spirometry: results normal (FEV1: 2.48/86%, FVC: 3.10/86%, FEV1/FVC: 80%).    Spirometry consistent with normal pattern.   Allergy Studies: none       Malachi Bonds, MD  [facility]

## 2023-02-04 NOTE — Patient Instructions (Addendum)
1. Hoarseness - I think that this could be related to postnasal drip, but clearly there are some other factors going on. - I agree with doing the endoscopy to see what is going on down there.  2. Seasonal and perennial allergic rhinitis - Previous testing showed: trees, indoor molds, outdoor molds, dust mites, cat, and dog - DepoMedrol injection provided.  - Continue with: Allegra (fexofenadine) 180mg  tablet once daily - Continue with: Singulair (montelukast) 10mg  daily and Ryaltris (olopatadine/mometasone) two sprays per nostril 1-2 times daily as needed - Singulair can cause irritability and bad dreams, so beware of this and stop it if this becomes a problem.  - You can use an extra dose of the antihistamine, if needed, for breakthrough symptoms.  - Consider nasal saline rinses 1-2 times daily to remove allergens from the nasal cavities as well as help with mucous clearance (this is especially helpful to do before the nasal sprays are given) - Consider allergy shots as a means of long-term control. - Allergy shots "re-train" and "reset" the immune system to ignore environmental allergens and decrease the resulting immune response to those allergens (sneezing, itchy watery eyes, runny nose, nasal congestion, etc).    - Allergy shots improve symptoms in 75-85% of patients.  - If you decide to start it, I would do it before your deductible resets.   3. Return in about 3 months (around 05/07/2023).   Please inform us of any Emergency Department visits, hospitalizations, or changes in symptoms. Call us before going to the ED for breathing or allergy symptoms since we might be able to fit you in for a sick visit. Feel free to contact us anytime with any questions, problems, or concerns.  It was a pleasure to see you today!  Websites that have reliable patient information: 1. American Academy of Asthma, Allergy, and Immunology: www.aaaai.org 2. Food Allergy Research and Education (FARE):  foodallergy.org 3. Mothers of Asthmatics: http://www.asthmacommunitynetwork.org 4. American College of Allergy, Asthma, and Immunology: www.acaai.org   COVID-19 Vaccine Information can be found at: [email or web address] For questions related to vaccine distribution or appointments, please email [email or web address] or call [phone].     "Like" [place] on Facebook and Instagram for our latest updates!      A healthy democracy works best when [employer] participate! Make sure you are registered to vote! If you have moved or changed any of your contact information, you will need to get this updated before voting! Scan the QR codes below to learn more!      Allergy Shots  Allergies are the result of a chain reaction that starts in the immune system. Your immune system controls how your body defends itself. For instance, if you have an allergy to pollen, your immune system identifies pollen as an invader or allergen. Your immune system overreacts by producing antibodies called Immunoglobulin E (IgE). These antibodies travel to cells that release chemicals, causing an allergic reaction.  The concept behind allergy immunotherapy, whether it is received in the form of shots or tablets, is that the immune system can be desensitized to specific allergens that trigger allergy symptoms. Although it requires time and patience, the payback can be long-term relief. Allergy injections contain a dilute solution of those substances that you are allergic to based upon your skin testing and allergy history.   How Do Allergy Shots Work?  Allergy shots work much like a vaccine. Your body responds to injected amounts of a particular allergen given in increasing doses, eventually developing a resistance  and tolerance to it. Allergy shots can lead to decreased, minimal or no allergy symptoms.  There generally are two phases: build-up and maintenance.  Build-up often ranges from three to six months and involves receiving injections with increasing amounts of the allergens. The shots are typically given once or twice a week, though more rapid build-up schedules are sometimes used.  The maintenance phase begins when the most effective dose is reached. This dose is different for each person, depending on how allergic you are and your response to the build-up injections. Once the maintenance dose is reached, there are longer periods between injections, typically two to four weeks.  Occasionally doctors give cortisone-type shots that can temporarily reduce allergy symptoms. These types of shots are different and should not be confused with allergy immunotherapy shots.  Who Can Be Treated with Allergy Shots?  Allergy shots may be a good treatment approach for people with allergic rhinitis (hay fever), allergic asthma, conjunctivitis (eye allergy) or stinging insect allergy.   Before deciding to begin allergy shots, you should consider:   The length of allergy season and the severity of your symptoms  Whether medications and/or changes to your environment can control your symptoms  Your desire to avoid long-term medication use  Time: allergy immunotherapy requires a major time commitment  Cost: may vary depending on your insurance coverage  Allergy shots for children age 38 and older are effective and often well tolerated. They might prevent the onset of new allergen sensitivities or the progression to asthma.  Allergy shots are not started on patients who are pregnant but can be continued on patients who become pregnant while receiving them. In some patients with other medical conditions or who take certain common medications, allergy shots may be of risk. It is important to mention other medications you talk to your allergist.   What are the two types of build-ups offered:   RUSH or Rapid Desensitization -- one day of injections lasting from  8:30-4:30pm, injections every 1 hour.  Approximately half of the build-up process is completed in that one day.  The following week, normal build-up is resumed, and this entails ~16 visits either weekly or twice weekly, until reaching your "maintenance dose" which is continued weekly until eventually getting spaced out to every month for a duration of 3 to 5 years. The regular build-up appointments are nurse visits where the injections are administered, followed by required monitoring for 30 minutes.    Traditional build-up -- weekly visits for 6 -12 months until reaching "maintenance dose", then continue weekly until eventually spacing out to every 4 weeks as above. At these appointments, the injections are administered, followed by required monitoring for 30 minutes.     Either way is acceptable, and both are equally effective. With the rush protocol, the advantage is that less time is spent here for injections overall AND you would also reach maintenance dosing faster (which is when the clinical benefit starts to become more apparent). Not everyone is a candidate for rapid desensitization.   IF we proceed with the RUSH protocol, there are premedications which must be taken the day before and the day after the rush only (this includes antihistamines, steroids, and Singulair).  After the rush day, no prednisone or Singulair is required, and we just recommend antihistamines taken on your injection day.  What Is An Estimate of the Costs?  If you are interested in starting allergy injections, please check with your insurance company about your coverage for both allergy  vial sets and allergy injections.  Please do so prior to making the appointment to start injections.  The following are CPT codes to give to your insurance company. These are the amounts we BILL to the insurance company, but the amount YOU WILL PAY and WE RECEIVE IS SUBSTANTIALLY LESS and depends on the contracts we have with different  insurance companies.   Amount Billed to Insurance  Two allergy vial set  CPT 95165   $ 2400      Two injections   CPT 95117   $ 40  RUSH (Rapid Desensitization) CPT 95180 x 8 hours $500/hour  Regarding the allergy injections, your co-pay may or may not apply with each injection, so please confirm this with your insurance company. When you start allergy injections, 1 or 2 sets of vials are made based on your allergies.  Not all patients can be on one set of vials. A set of vials lasts 6 months to a year depending on how quickly you can proceed with your build-up of your allergy injections. Vials are personalized for each patient depending on their specific allergens.  How often are allergy injection given during the build-up period?   Injections are given at least weekly during the build-up period until your maintenance dose is achieved. Per the doctor's discretion, you may have the option of getting allergy injections two times per week during the build-up period. However, there must be at least 48 hours between injections. The build-up period is usually completed within 6-12 months depending on your ability to schedule injections and for adjustments for reactions. When maintenance dose is reached, your injection schedule is gradually changed to every two weeks and later to every three weeks. Injections will then continue every 4 weeks. Usually, injections are continued for a total of 3-5 years.   When Will I Feel Better?  Some may experience decreased allergy symptoms during the build-up phase. For others, it may take as long as 12 months on the maintenance dose. If there is no improvement after a year of maintenance, your allergist will discuss other treatment options with you.  If you aren't responding to allergy shots, it may be because there is not enough dose of the allergen in your vaccine or there are missing allergens that were not identified during your allergy testing. Other reasons  could be that there are high levels of the allergen in your environment or major exposure to non-allergic triggers like tobacco smoke.  What Is the Length of Treatment?  Once the maintenance dose is reached, allergy shots are generally continued for three to five years. The decision to stop should be discussed with your allergist at that time. Some people may experience a permanent reduction of allergy symptoms. Others may relapse and a longer course of allergy shots can be considered.  What Are the Possible Reactions?  The two types of adverse reactions that can occur with allergy shots are local and systemic. Common local reactions include very mild redness and swelling at the injection site, which can happen immediately or several hours after. Report a delayed reaction from your last injection. These include arm swelling or runny nose, watery eyes or cough that occurs within 12-24 hours after injection. A systemic reaction, which is less common, affects the entire body or a particular body system. They are usually mild and typically respond quickly to medications. Signs include increased allergy symptoms such as sneezing, a stuffy nose or hives.   Rarely, a serious systemic reaction called  anaphylaxis can develop. Symptoms include swelling in the throat, wheezing, a feeling of tightness in the chest, nausea or dizziness. Most serious systemic reactions develop within 30 minutes of allergy shots. This is why it is strongly recommended you wait in your doctor's office for 30 minutes after your injections. Your allergist is trained to watch for reactions, and his or her staff is trained and equipped with the proper medications to identify and treat them.   Report to the nurse immediately if you experience any of the following symptoms: swelling, itching or redness of the skin, hives, watery eyes/nose, breathing difficulty, excessive sneezing, coughing, stomach pain, diarrhea, or light headedness. These  symptoms may occur within 15-20 minutes after injection and may require medication.   Who Should Administer Allergy Shots?  The preferred location for receiving shots is your prescribing allergist's office. Injections can sometimes be given at another facility where the physician and staff are trained to recognize and treat reactions, and have received instructions by your prescribing allergist.  What if I am late for an injection?   Injection dose will be adjusted depending upon how many days or weeks you are late for your injection.   What if I am sick?   Please report any illness to the nurse before receiving injections. She may adjust your dose or postpone injections depending on your symptoms. If you have fever, flu, sinus infection or chest congestion it is best to postpone allergy injections until you are better. Never get an allergy injection if your asthma is causing you problems. If your symptoms persist, seek out medical care to get your health problem under control.  What If I am or Become Pregnant:  Women that become pregnant should schedule an appointment with [facility] before receiving any further allergy injections.

## 2023-02-07 ENCOUNTER — Telehealth: Payer: Self-pay | Admitting: Internal Medicine

## 2023-02-07 NOTE — Telephone Encounter (Signed)
Patient left a message on main # that she was calling back to schedule EGD and colonoscopy.

## 2023-02-08 ENCOUNTER — Telehealth: Payer: Self-pay | Admitting: Internal Medicine

## 2023-02-08 NOTE — Telephone Encounter (Signed)
[facility]   Patient returned your call by leaving a message on the front desk voice mail

## 2023-02-08 NOTE — Telephone Encounter (Signed)
[facility]

## 2023-02-08 NOTE — Telephone Encounter (Signed)
Patient returned your call by leaving a message on the front desk voice mail

## 2023-02-08 NOTE — Telephone Encounter (Signed)
See previous TE

## 2023-02-14 ENCOUNTER — Encounter: Payer: Self-pay | Admitting: *Deleted

## 2023-02-14 ENCOUNTER — Other Ambulatory Visit: Payer: Self-pay | Admitting: *Deleted

## 2023-02-14 MED ORDER — PEG 3350-KCL-NA BICARB-NACL 420 G PO SOLR: 4000.0000 mL | Freq: Once | ORAL | 0 refills | Status: AC

## 2023-02-14 NOTE — Telephone Encounter (Signed)
Pt given 03/07/23 or 03/14/23 for dates of procedure. She states she will have to call someone to see which day works best for her. Pt to call back with date she can do. Those two dates have been held pending call back from pt.

## 2023-02-14 NOTE — Telephone Encounter (Signed)
[facility] [facility] for EGD: Date of Service: 03/14/2023   CPT Code: GEEGD Description: EGD-esophagogastroduodenoscopy Authorization Number: [id number] Case Number: [id number] Review Date: 02/14/2023 2:10:53 PM Expiration Date: 08/13/2023 Status: Your case has been Approved. The prior authorization you submitted, Case [id number], has been received. Additional case status notifications will be sent if you opted in for email notifications. Thank you

## 2023-02-14 NOTE — Telephone Encounter (Signed)
Pt states she can do 03/14/23. Instructions mailed and prep sent to the pharmacy.

## 2023-02-14 NOTE — Telephone Encounter (Signed)
See previous TE on 12/20/22

## 2023-03-04 ENCOUNTER — Ambulatory Visit: Payer: Managed Care, Other (non HMO)

## 2023-03-14 ENCOUNTER — Ambulatory Visit (HOSPITAL_COMMUNITY)
Admission: RE | Admit: 2023-03-14 | Discharge: 2023-03-14 | Disposition: A | Discharge status: Home / Self Care | Payer: Managed Care, Other (non HMO) | Attending: Internal Medicine | Admitting: Internal Medicine

## 2023-03-14 ENCOUNTER — Other Ambulatory Visit: Payer: Self-pay

## 2023-03-14 ENCOUNTER — Ambulatory Visit (HOSPITAL_COMMUNITY): Payer: Managed Care, Other (non HMO) | Admitting: Certified Registered Nurse Anesthetist

## 2023-03-14 ENCOUNTER — Encounter (HOSPITAL_COMMUNITY): Payer: Self-pay | Admitting: Internal Medicine

## 2023-03-14 ENCOUNTER — Encounter (HOSPITAL_COMMUNITY)
Admission: RE | Disposition: A | Discharge status: Home / Self Care | Payer: Self-pay | Source: Home / Self Care | Attending: Internal Medicine

## 2023-03-14 DIAGNOSIS — R1310 Dysphagia, unspecified: Secondary | ICD-10-CM

## 2023-03-14 DIAGNOSIS — E119 Type 2 diabetes mellitus without complications: Secondary | ICD-10-CM | POA: Diagnosis not present

## 2023-03-14 DIAGNOSIS — K635 Polyp of colon: Secondary | ICD-10-CM | POA: Diagnosis not present

## 2023-03-14 DIAGNOSIS — Z1211 Encounter for screening for malignant neoplasm of colon: Secondary | ICD-10-CM | POA: Insufficient documentation

## 2023-03-14 DIAGNOSIS — R490 Dysphonia: Secondary | ICD-10-CM | POA: Diagnosis not present

## 2023-03-14 DIAGNOSIS — K648 Other hemorrhoids: Secondary | ICD-10-CM | POA: Diagnosis not present

## 2023-03-14 DIAGNOSIS — Z853 Personal history of malignant neoplasm of breast: Secondary | ICD-10-CM | POA: Insufficient documentation

## 2023-03-14 DIAGNOSIS — K219 Gastro-esophageal reflux disease without esophagitis: Secondary | ICD-10-CM | POA: Diagnosis not present

## 2023-03-14 DIAGNOSIS — J45909 Unspecified asthma, uncomplicated: Secondary | ICD-10-CM | POA: Diagnosis not present

## 2023-03-14 HISTORY — PX: COLONOSCOPY WITH PROPOFOL: SHX5780

## 2023-03-14 HISTORY — PX: MALONEY DILATION: SHX5535

## 2023-03-14 HISTORY — PX: ESOPHAGOGASTRODUODENOSCOPY (EGD) WITH PROPOFOL: SHX5813

## 2023-03-14 HISTORY — PX: POLYPECTOMY: SHX5525

## 2023-03-14 HISTORY — PX: BIOPSY: SHX5522

## 2023-03-14 LAB — GLUCOSE, CAPILLARY: Glucose-Capillary: 103 mg/dL — ABNORMAL HIGH (ref 70–99)

## 2023-03-14 LAB — POCT PREGNANCY, URINE: Preg Test, Ur: NEGATIVE

## 2023-03-14 SURGERY — COLONOSCOPY WITH PROPOFOL
Anesthesia: General

## 2023-03-14 MED ORDER — PROPOFOL 10 MG/ML IV BOLUS
INTRAVENOUS | Status: DC | PRN
  Administered 2023-03-14 (×6): 50 mg via INTRAVENOUS

## 2023-03-14 MED ORDER — ONDANSETRON HCL 4 MG/2ML IJ SOLN
INTRAMUSCULAR | Status: AC
  Filled 2023-03-14: qty 2

## 2023-03-14 MED ORDER — SODIUM CHLORIDE 0.9% FLUSH: 10.0000 mL | Freq: Two times a day (BID) | INTRAVENOUS | Status: DC

## 2023-03-14 MED ORDER — PROPOFOL 500 MG/50ML IV EMUL
INTRAVENOUS | Status: DC | PRN
  Administered 2023-03-14: 150 ug/kg/min via INTRAVENOUS

## 2023-03-14 MED ORDER — STERILE WATER FOR IRRIGATION IR SOLN
Status: DC | PRN
  Administered 2023-03-14: 60 mL

## 2023-03-14 MED ORDER — LIDOCAINE HCL (CARDIAC) PF 100 MG/5ML IV SOSY
PREFILLED_SYRINGE | INTRAVENOUS | Status: DC | PRN
  Administered 2023-03-14: 50 mg via INTRATRACHEAL

## 2023-03-14 MED ORDER — LACTATED RINGERS IV SOLN: INTRAVENOUS | Status: DC | PRN

## 2023-03-14 MED ORDER — SODIUM CHLORIDE FLUSH 0.9 % IV SOLN
INTRAVENOUS | Status: AC
Start: 2023-03-14 — End: ?
  Filled 2023-03-14: qty 10

## 2023-03-14 MED ORDER — ONDANSETRON HCL 4 MG/2ML IJ SOLN
INTRAMUSCULAR | Status: DC | PRN
  Administered 2023-03-14: 4 mg via INTRAVENOUS

## 2023-03-14 NOTE — Discharge Instructions (Addendum)
EGD Discharge instructions Please read the instructions outlined below and refer to this sheet in the next few weeks. These discharge instructions provide you with general information on caring for yourself after you leave the hospital. Your doctor may also give you specific instructions. While your treatment has been planned according to the most current medical practices available, unavoidable complications occasionally occur. If you have any problems or questions after discharge, please call your doctor. ACTIVITY You may resume your regular activity but move at a slower pace for the next 24 hours.  Take frequent rest periods for the next 24 hours.  Walking will help expel (get rid of) the air and reduce the bloated feeling in your abdomen.  No driving for 24 hours (because of the anesthesia (medicine) used during the test).  You may shower.  Do not sign any important legal documents or operate any machinery for 24 hours (because of the anesthesia used during the test).  NUTRITION Drink plenty of fluids.  You may resume your normal diet.  Begin with a light meal and progress to your normal diet.  Avoid alcoholic beverages for 24 hours or as instructed by your caregiver.  MEDICATIONS You may resume your normal medications unless your caregiver tells you otherwise.  WHAT YOU CAN EXPECT TODAY You may experience abdominal discomfort such as a feeling of fullness or "gas" pains.  FOLLOW-UP Your doctor will discuss the results of your test with you.  SEEK IMMEDIATE MEDICAL ATTENTION IF ANY OF THE FOLLOWING OCCUR: Excessive nausea (feeling sick to your stomach) and/or vomiting.  Severe abdominal pain and distention (swelling).  Trouble swallowing.  Temperature over 101 F (37.8 C).  Rectal bleeding or vomiting of blood.    Colonoscopy Discharge Instructions  Read the instructions outlined below and refer to this sheet in the next few weeks. These discharge instructions provide you with  general information on caring for yourself after you leave the hospital. Your doctor may also give you specific instructions. While your treatment has been planned according to the most current medical practices available, unavoidable complications occasionally occur. If you have any problems or questions after discharge, call Dr. Jena Gauss at [phone]. ACTIVITY You may resume your regular activity, but move at a slower pace for the next 24 hours.  Take frequent rest periods for the next 24 hours.  Walking will help get rid of the air and reduce the bloated feeling in your belly (abdomen).  No driving for 24 hours (because of the medicine (anesthesia) used during the test).   Do not sign any important legal documents or operate any machinery for 24 hours (because of the anesthesia used during the test).  NUTRITION Drink plenty of fluids.  You may resume your normal diet as instructed by your doctor.  Begin with a light meal and progress to your normal diet. Heavy or fried foods are harder to digest and may make you feel sick to your stomach (nauseated).  Avoid alcoholic beverages for 24 hours or as instructed.  MEDICATIONS You may resume your normal medications unless your doctor tells you otherwise.  WHAT YOU CAN EXPECT TODAY Some feelings of bloating in the abdomen.  Passage of more gas than usual.  Spotting of blood in your stool or on the toilet paper.  IF YOU HAD POLYPS REMOVED DURING THE COLONOSCOPY: No aspirin products for 7 days or as instructed.  No alcohol for 7 days or as instructed.  Eat a soft diet for the next 24 hours.  FINDING  OUT THE RESULTS OF YOUR TEST Not all test results are available during your visit. If your test results are not back during the visit, make an appointment with your caregiver to find out the results. Do not assume everything is normal if you have not heard from your caregiver or the medical facility. It is important for you to follow up on all of your test  results.  SEEK IMMEDIATE MEDICAL ATTENTION IF: You have more than a spotting of blood in your stool.  Your belly is swollen (abdominal distention).  You are nauseated or vomiting.  You have a temperature over 101.  You have abdominal pain or discomfort that is severe or gets worse throughout the day.      Your upper GI tract appeared normal.  Your esophagus was stretched.  Biopsies of your esophagus were taken   1 small polyp removed from your colon  Further recommendations to follow once I get the laboratory results back for review  Continue Protonix twice daily but remember to take it 30 minutes before first and supper-not at bedtime  Continue Trulance 3 mg daily; add MiraLAX 17 g orally at bedtime  Office visit with Tana Coast in 4 weeks  At patient request, called Trey Paula at [phone] -reviewed findings and recommendations

## 2023-03-14 NOTE — Progress Notes (Signed)
Natalie Cardenas had a  medical procedure requiring anesthesia on 03/14/2023.  She may return to work on Wednesday 03/16/2023.  Thank you Virgie Dad RN [facility]

## 2023-03-14 NOTE — Anesthesia Preprocedure Evaluation (Signed)
Anesthesia Evaluation  Patient identified by MRN, date of birth, ID band Patient awake    Reviewed: Allergy & Precautions, H&P , NPO status , Patient's Chart, lab work & pertinent test results, reviewed documented beta blocker date and time   Airway Mallampati: II  TM Distance: >3 FB Neck ROM: full    Dental no notable dental hx.    Pulmonary neg pulmonary ROS, asthma    Pulmonary exam normal breath sounds clear to auscultation       Cardiovascular Exercise Tolerance: Good + DOE  negative cardio ROS  Rhythm:regular Rate:Normal     Neuro/Psych  PSYCHIATRIC DISORDERS Anxiety Depression     Neuromuscular disease negative neurological ROS  negative psych ROS   GI/Hepatic negative GI ROS, Neg liver ROS,GERD  ,,  Endo/Other  negative endocrine ROSdiabetes    Renal/GU negative Renal ROS  negative genitourinary   Musculoskeletal   Abdominal   Peds  Hematology negative hematology ROS (+)   Anesthesia Other Findings   Reproductive/Obstetrics negative OB ROS                             Anesthesia Physical Anesthesia Plan  ASA: 2  Anesthesia Plan: General   Post-op Pain Management:    Induction:   PONV Risk Score and Plan: Propofol infusion  Airway Management Planned:   Additional Equipment:   Intra-op Plan:   Post-operative Plan:   Informed Consent: I have reviewed the patients History and Physical, chart, labs and discussed the procedure including the risks, benefits and alternatives for the proposed anesthesia with the patient or authorized representative who has indicated his/her understanding and acceptance.     Dental Advisory Given  Plan Discussed with: CRNA  Anesthesia Plan Comments:        Anesthesia Quick Evaluation

## 2023-03-14 NOTE — Op Note (Signed)
[facility] Patient Name: Natalie Cardenas Procedure Date: 03/14/2023 7:58 AM MRN: [id number] Date of Birth: 12-13-1975 Attending MD: Gennette Pac , MD, [id number] CSN: [id number] Age: 47 Admit Type: Outpatient Procedure:                Upper GI endoscopy Indications:              Dysphagia; throat clearing. GERD. Taking twice                            daily Protonix after meals. Singulair added by                            allergist. Some improvement. Providers:                Gennette Pac, MD, Angelica Ran, Elinor Parkinson Referring MD:              Medicines:                Propofol per Anesthesia Complications:            No immediate complications. Estimated Blood Loss:     Estimated blood loss was minimal. Procedure:                Pre-Anesthesia Assessment:                           - Prior to the procedure, a History and Physical                            was performed, and patient medications and                            allergies were reviewed. The patient's tolerance of                            previous anesthesia was also reviewed. The risks                            and benefits of the procedure and the sedation                            options and risks were discussed with the patient.                            All questions were answered, and informed consent                            was obtained. Prior Anticoagulants: The patient has                            taken no anticoagulant or antiplatelet agents. ASA  Grade Assessment: II - A patient with mild systemic                            disease. After reviewing the risks and benefits,                            the patient was deemed in satisfactory condition to                            undergo the procedure.                           After obtaining informed consent, the endoscope was                            passed under direct  vision. Throughout the                            procedure, the patient's blood pressure, pulse, and                            oxygen saturations were monitored continuously. The                            GIF-H190 ([id number]) scope was introduced through the                            mouth, and advanced to the second part of duodenum.                            The upper GI endoscopy was accomplished without                            difficulty. The patient tolerated the procedure                            well. Scope In: 8:28:24 AM Scope Out: 8:35:47 AM Total Procedure Duration: 0 hours 7 minutes 23 seconds  Findings:      The examined esophagus was normal.      The entire examined stomach was normal.      The duodenal bulb and second portion of the duodenum were normal. The       scope was withdrawn. Dilation was performed with a Maloney dilator with       mild resistance at 52 Fr. The dilation site was examined following       endoscope reinsertion and showed no change. Estimated blood loss: none.       Finally, biopsies mid distal esophagus taken for histologic study Impression:               - Normal esophagus. Dilated and biopsied                           - Normal stomach.                           -  Normal duodenal bulb and second portion of the                            duodenum.                           - Moderate Sedation:      Moderate (conscious) sedation was personally administered by an       anesthesia professional. The following parameters were monitored: oxygen       saturation, heart rate, blood pressure, respiratory rate, EKG, adequacy       of pulmonary ventilation, and response to care. Recommendation:           - Patient has a contact number available for                            emergencies. The signs and symptoms of potential                            delayed complications were discussed with the                            patient. Return to normal  activities tomorrow.                            Written discharge instructions were provided to the                            patient.                           - Advance diet as tolerated.                           - Continue present medications. However, Protonix                            to be taken 40 mg 30 minutes before breakfast and                            supper?"not at bedtime.                           - Return to my office in 4 weeks. See colonoscopy                            report. Procedure Code(s):        --- Professional ---                           [phone], Esophagogastroduodenoscopy, flexible,                            transoral; diagnostic, including collection of                            specimen(s) by brushing or  washing, when performed                            (separate procedure)                           43450, Dilation of esophagus, by unguided sound or                            bougie, single or multiple passes Diagnosis Code(s):        --- Professional ---                           R13.10, Dysphagia, unspecified CPT copyright 2022 American [employer] Association. All rights reserved. The codes documented in this report are preliminary and upon coder review may  be revised to meet current compliance requirements. Gerrit Friends. Mansour Balboa, MD Gennette Pac, MD 03/14/2023 9:20:58 AM This report has been signed electronically. Number of Addenda: 0

## 2023-03-14 NOTE — Anesthesia Postprocedure Evaluation (Signed)
Anesthesia Post Note  Patient: Natalie Cardenas  Procedure(s) Performed: COLONOSCOPY WITH PROPOFOL ESOPHAGOGASTRODUODENOSCOPY (EGD) WITH PROPOFOL MALONEY DILATION BIOPSY POLYPECTOMY  Patient location during evaluation: Endoscopy Anesthesia Type: General Level of consciousness: awake and alert Pain management: pain level controlled Vital Signs Assessment: post-procedure vital signs reviewed and stable Respiratory status: spontaneous breathing Cardiovascular status: blood pressure returned to baseline and stable Postop Assessment: no apparent nausea or vomiting Anesthetic complications: no   No notable events documented.   Last Vitals:  Vitals:   03/14/23 0723 03/14/23 0725  BP: 131/78   Pulse: 74   Resp: 14   Temp:  36.6 C  SpO2: 98%     Last Pain:  Vitals:   03/14/23 0824  TempSrc:   PainSc: 4                  Donyelle Enyeart

## 2023-03-14 NOTE — Addendum Note (Signed)
Addendum  created 03/14/23 0933 by Moshe Salisbury, CRNA   Intraprocedure Meds edited

## 2023-03-14 NOTE — H&P (Signed)
@LOGO @   Primary Care Physician:  Avis Epley, PA-C Primary Gastroenterologist:  Dr. Jena Gauss  Pre-Procedure History & Physical: HPI:  Natalie Cardenas is a 47 y.o. female here for further evaluation of hoarseness GERD vague dysphagia and first-ever screening colonoscopy.  Chronic constipation managed with Trulance. Upper GI tract symptoms somewhat better taking Protonix twice daily with takes her Protonix at bedtime.  Vague dysphagia as previously documented; saw the allergist Singulair added to her regimen.  Past Medical History:  Diagnosis Date   Anxiety    Breast cancer (HCC)    Depression    Diabetes mellitus without complication (HCC)    Thyroid nodule     Past Surgical History:  Procedure Laterality Date   BREAST BIOPSY Right 12/28/2019   x2   BREAST BIOPSY Left 02/01/2020   BREAST RECONSTRUCTION WITH PLACEMENT OF TISSUE EXPANDER AND FLEX HD (ACELLULAR HYDRATED DERMIS) Bilateral 04/29/2020   Procedure: IMMEDIATE BILATERAL BREAST RECONSTRUCTION WITH PLACEMENT OF TISSUE EXPANDER AND FLEX HD (ACELLULAR HYDRATED DERMIS);  Surgeon: Allena Napoleon, MD;  Location: [facility];  Service: Plastics;  Laterality: Bilateral;   NIPPLE SPARING MASTECTOMY WITH SENTINEL LYMPH NODE BIOPSY Bilateral 04/29/2020   Procedure: BILATERAL NIPPLE SPARING MASTECTOMIES WITH RIGHT AXILLARY SENTINEL LYMPH NODE BIOPSY;  Surgeon: Emelia Loron, MD;  Location: [facility];  Service: General;  Laterality: Bilateral;  BILATERAL PEC BLOCK, RNFA   REMOVAL OF BILATERAL TISSUE EXPANDERS WITH PLACEMENT OF BILATERAL BREAST IMPLANTS Bilateral 06/23/2020   Procedure: REMOVAL OF BILATERAL TISSUE EXPANDERS WITH PLACEMENT OF BILATERAL BREAST IMPLANTS;  Surgeon: Allena Napoleon, MD;  Location: [facility];  Service: Plastics;  Laterality: Bilateral;  1.5 hours   TONSILLECTOMY      Prior to Admission medications   Medication Sig Start Date End Date Taking? Authorizing  Provider  albuterol (VENTOLIN HFA) 108 (90 Base) MCG/ACT inhaler  10/01/19  Yes [provider]  benzonatate (TESSALON) 200 MG capsule Take 1 capsule (200 mg total) by mouth 3 (three) times daily as needed for cough. 04/02/22  Yes Nyoka Cowden, MD  clonazePAM (KLONOPIN) 0.5 MG tablet Take 1 tablet (0.5 mg total) by mouth 2 (two) times daily as needed. 01/29/21  Yes Pollyann Samples, NP  fexofenadine (ALLEGRA) 180 MG tablet Take 180 mg by mouth daily.   Yes [provider]  ibuprofen (ADVIL) 200 MG tablet Take 800 mg by mouth daily. Current dose   Yes [provider]  montelukast (SINGULAIR) 10 MG tablet Take 1 tablet (10 mg total) by mouth at bedtime. 01/07/23  Yes Alfonse Spruce, MD  Olopatadine-Mometasone [facility]) [phone] MCG/ACT SUSP Place 2 sprays into the nose daily. 01/10/23  Yes Padgett, Pilar Grammes, MD  pantoprazole (PROTONIX) 40 MG tablet Take 1 tablet (40 mg total) by mouth 2 (two) times daily before a meal. Take 30-60 min before first meal of the day 12/17/22  Yes Tiffany Kocher, PA-C  Prenatal Vit-Fe Fumarate-FA (PRENATAL VITAMIN PO) Take 1 tablet by mouth at bedtime.   Yes [provider]  sennosides-docusate sodium (SENOKOT-S) 8.6-50 MG tablet Take 3 tablets by mouth at bedtime.   Yes [provider]  acetaminophen (TYLENOL) 500 MG tablet Take 500 mg by mouth every 6 (six) hours as needed.    [provider]  Plecanatide (TRULANCE) 3 MG TABS Take 1 tablet (3 mg total) by mouth daily. 12/17/22   Tiffany Kocher, PA-C    Allergies as of 02/14/2023 - Review Complete 02/04/2023  Allergen Reaction Noted   Gabapentin Shortness Of Breath 03/12/2021   Lyrica cr [pregabalin er] Other (See Comments) 04/01/2021   Other Rash 06/17/2020    Family History  Problem Relation Age of Onset   Diabetes Mother    Heart failure Mother    Asthma Mother    Cancer Father 6       esophageal cancer   Diabetes Maternal Grandmother     Brain cancer Paternal Grandmother    Cancer Paternal Grandfather        esophageal cancer     Social History   Socioeconomic History   Marital status: Married    Spouse name: Not on file   Number of children: 4   Years of education: Not on file   Highest education level: Not on file  Occupational History   Not on file  Tobacco Use   Smoking status: Never   Smokeless tobacco: Never  Vaping Use   Vaping status: Never Used  Substance and Sexual Activity   Alcohol use: Yes    Comment: social    Drug use: Never   Sexual activity: Yes  Other Topics Concern   Not on file  Social History Narrative   Lives at home with husband and two children, two out of the house.   Social Determinants of Health   Financial Resource Strain: Not on file  Food Insecurity: Not on file  Transportation Needs: Not on file  Physical Activity: Not on file  Stress: Not on file  Social Connections: Unknown (09/04/2021)   Received from [facility], [facility]   Social Network    Social Network: Not on file  Intimate Partner Violence: Unknown (07/28/2021)   Received from [facility], [facility]   HITS    Physically Hurt: Not on file    Insult or Talk Down To: Not on file    Threaten Physical Harm: Not on file    Scream or Curse: Not on file    Review of Systems: See HPI, otherwise negative ROS  Physical Exam: BP 131/78   Pulse 74   Temp 97.8 F (36.6 C) (Oral)   Resp 14   Ht 5\' 5"  (1.651 m)   Wt 70.3 kg   LMP  (Approximate) Comment: over a year  SpO2 98%   BMI 25.79 kg/m  General:   Alert,  Well-developed, well-nourished, pleasant and cooperative in NAD Skin:  Intact without significant lesions or rashes. No significant cervical adenopathy. Lungs:  Clear throughout to auscultation.   No wheezes, crackles, or rhonchi. No acute distress. Heart:  Regular rate and rhythm; no murmurs, clicks, rubs,  or gallops. Abdomen: Non-distended, normal bowel sounds.  Soft and nontender  without appreciable mass or hepatosplenomegaly.   Impression/Plan: Hoarseness, throat clearing vague dysphagia vague reflux symptoms here for further evaluation via EGD with possible esophageal GL dilation is feasible/appropriate and also she is here for first ever screening colonoscopy The risks, benefits, limitations, imponderables and alternatives regarding both EGD and colonoscopy have been reviewed with the patient. Questions have been answered. All parties agreeable.       Notice: This dictation was prepared with Dragon dictation along with smaller phrase technology. Any transcriptional errors that result from this process are unintentional and may not be corrected upon review.

## 2023-03-14 NOTE — Transfer of Care (Signed)
Immediate Anesthesia Transfer of Care Note  Patient: Natalie Cardenas  Procedure(s) Performed: COLONOSCOPY WITH PROPOFOL ESOPHAGOGASTRODUODENOSCOPY (EGD) WITH PROPOFOL MALONEY DILATION BIOPSY POLYPECTOMY  Patient Location: Endoscopy Unit  Anesthesia Type:General  Level of Consciousness: awake  Airway & Oxygen Therapy: Patient Spontanous Breathing  Post-op Assessment: Report given to RN  Post vital signs: Reviewed and stable  Last Vitals:  Vitals Value Taken Time  BP    Temp    Pulse    Resp    SpO2      Last Pain:  Vitals:   03/14/23 0824  TempSrc:   PainSc: 4       Patients Stated Pain Goal: 7 (03/14/23 0723)  Complications: No notable events documented.

## 2023-03-14 NOTE — Op Note (Signed)
[facility] Patient Name: Natalie Cardenas Procedure Date: 03/14/2023 7:53 AM MRN: [id number] Date of Birth: 12-05-1975 Attending MD: Gennette Pac , MD, [id number] CSN: [id number] Age: 47 Admit Type: Outpatient Procedure:                Colonoscopy Indications:              Screening for colorectal malignant neoplasm Providers:                Gennette Pac, MD, Angelica Ran, Elinor Parkinson Referring MD:              Medicines:                Propofol per Anesthesia Complications:            No immediate complications. Estimated Blood Loss:     Estimated blood loss was minimal. Procedure:                Pre-Anesthesia Assessment:                           - Prior to the procedure, a History and Physical                            was performed, and patient medications and                            allergies were reviewed. The patient's tolerance of                            previous anesthesia was also reviewed. The risks                            and benefits of the procedure and the sedation                            options and risks were discussed with the patient.                            All questions were answered, and informed consent                            was obtained. Prior Anticoagulants: The patient has                            taken no anticoagulant or antiplatelet agents. ASA                            Grade Assessment: II - A patient with mild systemic                            disease. After reviewing the risks and benefits,  the patient was deemed in satisfactory condition to                            undergo the procedure.                           After obtaining informed consent, the colonoscope                            was passed under direct vision. Throughout the                            procedure, the patient's blood pressure, pulse, and                            oxygen  saturations were monitored continuously. The                            [id number]) scope was introduced through the                            anus and advanced to the the cecum, identified by                            appendiceal orifice and ileocecal valve. The                            colonoscopy was performed without difficulty. The                            patient tolerated the procedure well. The quality                            of the bowel preparation was adequate. The entire                            colon was well visualized. Scope In: 8:42:38 AM Scope Out: 9:05:23 AM Scope Withdrawal Time: 0 hours 9 minutes 2 seconds  Total Procedure Duration: 0 hours 22 minutes 45 seconds  Findings:      The perianal and digital rectal examinations were normal. Minimal       internal hemorrhoids.      A 5 mm polyp was found in the mid sigmoid colon. The polyp was sessile.       The polyp was removed with a cold snare. Resection and retrieval were       complete. Estimated blood loss was minimal.      The exam was otherwise without abnormality on direct and retroflexion       views. Impression:               - One 5 mm polyp in the mid sigmoid colon, removed                            with a cold snare. Resected and retrieved. Minimal  internal hemorrhoids.                           - The examination was otherwise normal on direct                            and retroflexion views. Moderate Sedation:      Moderate (conscious) sedation was personally administered by an       anesthesia professional. The following parameters were monitored: oxygen       saturation, heart rate, blood pressure, respiratory rate, EKG, adequacy       of pulmonary ventilation, and response to care. Recommendation:           - Patient has a contact number available for                            emergencies. The signs and symptoms of potential                             delayed complications were discussed with the                            patient. Return to normal activities tomorrow.                            Written discharge instructions were provided to the                            patient.                           - Advance diet as tolerated.                           - Continue present medications.                           - Repeat colonoscopy date to be determined after                            pending pathology results are reviewed for                            surveillance.                           - Return to GI office in 4 weeks. See EGD report. Procedure Code(s):        --- Professional ---                           [phone], Colonoscopy, flexible; with removal of                            tumor(s), polyp(s), or other lesion(s) by snare  technique Diagnosis Code(s):        --- Professional ---                           Z12.11, Encounter for screening for malignant                            neoplasm of colon                           D12.5, Benign neoplasm of sigmoid colon CPT copyright 2022 American [employer] Association. All rights reserved. The codes documented in this report are preliminary and upon coder review may  be revised to meet current compliance requirements. Gerrit Friends. Cyncere Ruhe, MD Gennette Pac, MD 03/14/2023 9:23:56 AM This report has been signed electronically. Number of Addenda: 0

## 2023-03-15 ENCOUNTER — Encounter: Payer: Self-pay | Admitting: Internal Medicine

## 2023-03-15 LAB — SURGICAL PATHOLOGY

## 2023-03-18 ENCOUNTER — Encounter (HOSPITAL_COMMUNITY): Payer: Self-pay | Admitting: Internal Medicine

## 2023-04-11 ENCOUNTER — Ambulatory Visit: Payer: Managed Care, Other (non HMO) | Admitting: Gastroenterology

## 2023-04-29 ENCOUNTER — Ambulatory Visit: Payer: Managed Care, Other (non HMO) | Admitting: Internal Medicine

## 2023-04-29 VITALS — BP 126/80 | HR 88 | Temp 97.7°F | Ht 65.0 in | Wt 155.2 lb

## 2023-04-29 DIAGNOSIS — K5909 Other constipation: Secondary | ICD-10-CM

## 2023-04-29 DIAGNOSIS — R0989 Other specified symptoms and signs involving the circulatory and respiratory systems: Secondary | ICD-10-CM | POA: Diagnosis not present

## 2023-04-29 DIAGNOSIS — R053 Chronic cough: Secondary | ICD-10-CM

## 2023-04-29 DIAGNOSIS — K5904 Chronic idiopathic constipation: Secondary | ICD-10-CM

## 2023-04-29 DIAGNOSIS — R490 Dysphonia: Secondary | ICD-10-CM

## 2023-04-29 NOTE — Progress Notes (Signed)
 Primary Care Physician:  Leonce Lucie JINNY DEVONNA Primary Gastroenterologist:  Dr. Shaaron  Pre-Procedure History & Physical: HPI:  Natalie Cardenas is a 48 y.o. female here for dysphagia.  Normal esophagus at EGD last year -  dysphagia responded nicely to empiric [facility] dilation.  States that throat clearing has gotten better with Protonix  twice daily.  Still has it but it is much improved.  Dealing with new breast masses around her prosthesis for which further evaluation is being orchestrated.  Hyperplastic polyp on colonoscopy last year; due for repeat screening in 10-years.  Chronic constipation suboptimally managed now with Trulance  3 mg daily and OTC stool softener/laxatives.  May go for 5 days without a bowel movement on this regiment.  Rarely gets an urge to have a bowel movement.  Past Medical History:  Diagnosis Date   Anxiety    Breast cancer (HCC)    Depression    Diabetes mellitus without complication (HCC)    Thyroid  nodule     Past Surgical History:  Procedure Laterality Date   BIOPSY  03/14/2023   Procedure: BIOPSY;  Surgeon: Shaaron Lamar HERO, MD;  [facility];  Service: Endoscopy;;   BREAST BIOPSY Right 12/28/2019   x2   BREAST BIOPSY Left 02/01/2020   BREAST RECONSTRUCTION WITH PLACEMENT OF TISSUE EXPANDER AND FLEX HD (ACELLULAR HYDRATED DERMIS) Bilateral 04/29/2020   Procedure: IMMEDIATE BILATERAL BREAST RECONSTRUCTION WITH PLACEMENT OF TISSUE EXPANDER AND FLEX HD (ACELLULAR HYDRATED DERMIS);  Surgeon: Elisabeth Craig RAMAN, MD;  Location: [facility];  Service: Plastics;  Laterality: Bilateral;   COLONOSCOPY WITH PROPOFOL  N/A 03/14/2023   Procedure: COLONOSCOPY WITH PROPOFOL ;  Surgeon: Shaaron Lamar HERO, MD;  [facility]: [facility];  Service: Endoscopy;  Laterality: N/A;  8:15 am, asa 2   ESOPHAGOGASTRODUODENOSCOPY (EGD) WITH PROPOFOL  N/A 03/14/2023   Procedure: ESOPHAGOGASTRODUODENOSCOPY (EGD) WITH PROPOFOL ;  Surgeon: Shaaron Lamar HERO, MD;  Location:  [facility];  Service: Endoscopy;  Laterality: N/A;   MALONEY DILATION N/A 03/14/2023   Procedure: AGAPITO DILATION;  Surgeon: Shaaron Lamar HERO, MD;  Location: [facility];  Service: Endoscopy;  Laterality: N/A;   NIPPLE SPARING MASTECTOMY WITH SENTINEL LYMPH NODE BIOPSY Bilateral 04/29/2020   Procedure: BILATERAL NIPPLE SPARING MASTECTOMIES WITH RIGHT AXILLARY SENTINEL LYMPH NODE BIOPSY;  Surgeon: Ebbie Cough, MD;  Location: [facility];  Service: General;  Laterality: Bilateral;  BILATERAL PEC BLOCK, RNFA   POLYPECTOMY  03/14/2023   Procedure: POLYPECTOMY;  Surgeon: Shaaron Lamar HERO, MD;  Location: [facility];  Service: Endoscopy;;   REMOVAL OF BILATERAL TISSUE EXPANDERS WITH PLACEMENT OF BILATERAL BREAST IMPLANTS Bilateral 06/23/2020   Procedure: REMOVAL OF BILATERAL TISSUE EXPANDERS WITH PLACEMENT OF BILATERAL BREAST IMPLANTS;  Surgeon: Elisabeth Craig RAMAN, MD;  Location: [facility];  Service: Plastics;  Laterality: Bilateral;  1.5 hours   TONSILLECTOMY      Prior to Admission medications   Medication Sig Start Date End Date Taking? Authorizing Provider  acetaminophen  (TYLENOL ) 500 MG tablet Take 500 mg by mouth every 6 (six) hours as needed.   Yes [provider]  clonazePAM  (KLONOPIN ) 0.5 MG tablet Take 1 tablet (0.5 mg total) by mouth 2 (two) times daily as needed. 01/29/21  Yes Burton, Lacie K, NP  fexofenadine (ALLEGRA) 180 MG tablet Take 180 mg by mouth daily.   Yes [provider]  ibuprofen  (ADVIL ) 200 MG tablet Take 800 mg by mouth daily. Current dose   Yes [provider]  montelukast  (SINGULAIR ) 10 MG tablet Take 1 tablet (10 mg total) by mouth at bedtime. 01/07/23  Yes Iva Marty Saltness, MD  pantoprazole  (PROTONIX ) 40 MG tablet Take 1 tablet (40 mg total) by mouth 2 (two) times daily before a meal. Take 30-60 min before first meal of the day 12/17/22  Yes Ezzard Sonny RAMAN, PA-C  Plecanatide  (TRULANCE ) 3 MG TABS Take 1  tablet (3 mg total) by mouth daily. 12/17/22  Yes Ezzard Sonny RAMAN, PA-C  Prenatal Vit-Fe Fumarate-FA (PRENATAL VITAMIN PO) Take 1 tablet by mouth at bedtime.   Yes [provider]  sennosides-docusate sodium (SENOKOT-S) 8.6-50 MG tablet Take 3 tablets by mouth at bedtime.   Yes [provider]    Allergies as of 04/29/2023 - Review Complete 04/29/2023  Allergen Reaction Noted   Gabapentin Shortness Of Breath 03/12/2021   Lyrica  cr [pregabalin  er] Other (See Comments) 04/01/2021   Other Rash 06/17/2020    Family History  Problem Relation Age of Onset   Diabetes Mother    Heart failure Mother    Asthma Mother    Cancer Father 43       esophageal cancer   Diabetes Maternal Grandmother    Brain cancer Paternal Grandmother    Cancer Paternal Grandfather        esophageal cancer     Social History   Socioeconomic History   Marital status: Married    Spouse name: Not on file   Number of children: 4   Years of education: Not on file   Highest education level: Not on file  Occupational History   Not on file  Tobacco Use   Smoking status: Never   Smokeless tobacco: Never  Vaping Use   Vaping status: Never Used  Substance and Sexual Activity   Alcohol use: Yes    Comment: social    Drug use: Never   Sexual activity: Yes  Other Topics Concern   Not on file  Social History Narrative   Lives at home with husband and two children, two out of the house.   Social Drivers of Corporate Investment Banker Strain: Not on file  Food Insecurity: Not on file  Transportation Needs: Not on file  Physical Activity: Not on file  Stress: Not on file  Social Connections: Unknown (09/04/2021)   Received from [facility], [facility]   Social Network    Social Network: Not on file  Intimate Partner Violence: Unknown (07/28/2021)   Received from [employer], [facility]   HITS    Physically Hurt: Not on file    Insult or Talk Down To: Not on file    Threaten  Physical Harm: Not on file    Scream or Curse: Not on file    Review of Systems: See HPI, otherwise negative ROS  Physical Exam: BP 126/80 (BP Location: Right Arm, Patient Position: Sitting, Cuff Size: Normal)   Pulse 88   Temp 97.7 F (36.5 C) (Oral)   Ht 5' 5 (1.651 m)   Wt 155 lb 3.2 oz (70.4 kg)   BMI 25.83 kg/m  General:   Alert,  Well-developed, well-nourished, pleasant and cooperative in NAD  Impression/Plan: 48 year old lady with throat clearing/chronic cough on twice daily PPI some improvement in symptoms.  No dysphagia since empiric dilation.  Patient perceives steady improvement in the symptoms.  Poorly controlled constipation sounds more like colonic inertia rather than a functional outlet obstruction.  Recent colonoscopy reassuring.   Recommendations:  For now, continue  Protonix  40 mg twice daily best taken before meals  Stop Trulance  and over-the-counter laxatives  Begin Linzess 145 1 gelcap daily.  Samples x 2 weeks.  Call in 2 weeks and let me know how this is working.  Will decide about long-term prescription at that time depending on your progress  Office visit here in 4 months     Notice: This dictation was prepared with Dragon dictation along with smaller phrase technology. Any transcriptional errors that result from this process are unintentional and may not be corrected upon review.

## 2023-04-29 NOTE — Patient Instructions (Signed)
 It was good to see you again today!  For now, continue Protonix  40 mg twice daily best taken before meals  Stop Trulance  and over-the-counter laxatives  Begin Linzess 145 1 gelcap daily.  Samples x 2 weeks.  Call me in 2 weeks and let me know how this is working for you.  Will decide about long-term prescription at that time depending on your progress  Office visit here in 4 months

## 2023-05-06 ENCOUNTER — Other Ambulatory Visit: Payer: Self-pay

## 2023-05-09 ENCOUNTER — Other Ambulatory Visit: Payer: Self-pay

## 2023-05-09 LAB — SURGICAL PATHOLOGY

## 2023-05-10 ENCOUNTER — Encounter: Payer: Self-pay | Admitting: Hematology

## 2023-05-10 ENCOUNTER — Inpatient Hospital Stay: Payer: Managed Care, Other (non HMO) | Attending: Hematology | Admitting: Hematology

## 2023-05-10 VITALS — BP 133/82 | HR 86 | Temp 97.7°F | Resp 17 | Wt 155.5 lb

## 2023-05-10 DIAGNOSIS — Z9013 Acquired absence of bilateral breasts and nipples: Secondary | ICD-10-CM | POA: Insufficient documentation

## 2023-05-10 DIAGNOSIS — D0511 Intraductal carcinoma in situ of right breast: Secondary | ICD-10-CM

## 2023-05-10 DIAGNOSIS — R202 Paresthesia of skin: Secondary | ICD-10-CM | POA: Diagnosis not present

## 2023-05-10 DIAGNOSIS — R079 Chest pain, unspecified: Secondary | ICD-10-CM | POA: Insufficient documentation

## 2023-05-10 DIAGNOSIS — N951 Menopausal and female climacteric states: Secondary | ICD-10-CM | POA: Insufficient documentation

## 2023-05-10 DIAGNOSIS — Z86000 Personal history of in-situ neoplasm of breast: Secondary | ICD-10-CM | POA: Diagnosis not present

## 2023-05-10 DIAGNOSIS — F419 Anxiety disorder, unspecified: Secondary | ICD-10-CM | POA: Insufficient documentation

## 2023-05-10 DIAGNOSIS — R7303 Prediabetes: Secondary | ICD-10-CM | POA: Diagnosis not present

## 2023-05-10 DIAGNOSIS — R200 Anesthesia of skin: Secondary | ICD-10-CM | POA: Insufficient documentation

## 2023-05-10 MED ORDER — CLONAZEPAM 0.5 MG PO TABS: 0.5000 mg | ORAL_TABLET | Freq: Two times a day (BID) | ORAL | 0 refills | Status: AC | PRN

## 2023-05-10 MED ORDER — DULOXETINE HCL 20 MG PO CPEP: 20.0000 mg | ORAL_CAPSULE | Freq: Every day | ORAL | 1 refills | Status: DC

## 2023-05-10 NOTE — Progress Notes (Signed)
 [facility]   [phone]) [phone] Fax:(336) [phone]   Clinic Follow up Note   Patient Care Team: Leonce Lucie JINNY DEVONNA as PCP - General (Family Medicine) Lavona Agent, MD as PCP - Cardiology (Cardiology) Glean Stephane BROCKS, RN as Oncology Nurse Navigator Tyree Nanetta SAILOR, RN as Oncology Nurse Navigator Lanny Callander, MD as Consulting Physician (Hematology) Ebbie Cough, MD as Consulting Physician (General Surgery) Burton, Lacie K, NP as Nurse Practitioner (Nurse Practitioner)  Date of Service:  05/10/2023  CHIEF COMPLAINT: f/u of history of DCIS   CURRENT THERAPY:  Observation    Assessment and Plan    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 Pain severity varies from 3 to 8 out of 10. Pain has improved over three years but persists. Extensive testing by pulmonology and cardiology returned normal results. Likely due to nerve damage and scar tissue from surgery. Pain is not related to physical activity and may occur spontaneously. Decreased stamina and weight gain due to lack of regular exercise. - Encourage regular exercise to improve stamina and overall health - Consider physical therapy for pain management - Discuss potential benefits of dry needling with physical therapist -I reassured her that her risk of future breast cancer is minimal <5%, since she has not had bilateral mastectomy.  No role for routine mammogram or screening breast MRI. -Recent biopsy showed benign scar tissue and fibroadenomatous changes. No malignancy detected.  -Patient is very anxious about her future risk of breast cancer.  Routine monitoring not required, but follow-up ultrasound in six months for reassurance is reasonable. - Order follow-up ultrasound in June 2025  Menopausal Symptoms Experiencing mood swings and anxiety potentially  related to menopause. Hypersensitive to medications. Discussed that antidepressants like Cymbalta  can help with mood stabilization and pain management. Hormone therapy not recommended due to history of breast cancer, though risk is low post-mastectomy. - Prescribe Cymbalta  20 mg for mood stabilization and pain management - Discuss with primary care physician for ongoing management of menopausal symptoms and potential use of hormone therapy if needed since that she has had a double mastectomy.  Anxiety Intermittent anxiety, exacerbated by recent biopsy and fear of malignancy. Currently using Klonopin  0.5 mg as needed, with a preference for lower dose due to side effects. Anxiety may contribute to symptoms like tingling and numbness in hands. - Refill Klonopin  0.5 mg prescription - Encourage sparing use of Klonopin   Borderline Diabetes Mellitus A1c approximately 6.7. Managing with diet, exercise, and Acarbose as needed. Discussed importance of diet and exercise in managing blood glucose levels. - Continue monitoring blood glucose levels - Encourage adherence to diet and exercise regimen - Follow up with endocrinologist as needed  General Health Maintenance Weight increased from 130 lbs to 155 lbs post-cancer treatment. No smoking or alcohol use. Discussed importance of regular exercise and healthy diet to manage weight and overall health. - Encourage regular exercise and healthy diet to manage weight and overall health  Follow-up - Order follow-up ultrasound for June 2025 -I also refilled her clonidine and called in Cymbalta  20 mg daily for her -She will follow-up with her PCP.  I will see her as needed in future.        SUMMARY OF ONCOLOGIC HISTORY: Oncology History Overview Note  Cancer Staging Ductal carcinoma in situ (DCIS) of right breast Staging form: Breast, AJCC 8th Edition - Clinical stage from 12/28/2019:  Stage 0 (cTis (Paget), cN0, cM0, GX, ER+, PR+, HER2: Not Assessed) -  Signed by Lanny Callander, MD on 01/02/2020    Ductal carcinoma in situ (DCIS) of right breast  12/14/2019 Mammogram   IMPRESSION: Suspicious calcifications in the OUTER RETROAREOLAR RIGHT breast spanning a distance of 3.5 cm. Tissue sampling is recommended.   12/28/2019 Cancer Staging   Staging form: Breast, AJCC 8th Edition - Clinical stage from 12/28/2019: Stage 0 (cTis (Paget), cN0, cM0, GX, ER+, PR+, HER2: Not Assessed) - Signed by Lanny Callander, MD on 01/02/2020   12/28/2019 Initial Biopsy   Diagnosis 1. Breast, right, needle core biopsy, lateral, anterior - DUCTAL CARCINOMA IN SITU, HIGH-GRADE WITH FOCAL NECROSIS AND CALCIFICATIONS. SEE NOTE 2. Breast, right, needle core biopsy, 12 o'clock, retroareolar - DUCTAL CARCINOMA IN SITU, HIGH-GRADE WITH FOCAL NECROSIS AND CALCIFICATIONS. SEE NOTE   Diagnosis Note 1. and 2. DCIS measures 0.4 cm in part 1 and 0.2 cm in part 2 in greatest linear dimension. Dr. Alvaro reviewed the case and concurs with the diagnosis. A breast prognostic profile (ER, PR) is pending and will be reported in an addendum. [facility] was notified on 01/01/2020.   12/28/2019 Receptors her2   1. PROGNOSTIC INDICATORS Results: IMMUNOHISTOCHEMICAL AND MORPHOMETRIC ANALYSIS PERFORMED MANUALLY Estrogen Receptor: 95%, POSITIVE, STRONG STAINING INTENSITY Progesterone Receptor: 70%, POSITIVE, STRONG STAINING INTENSITY   01/02/2020 Initial Diagnosis   Ductal carcinoma in situ (DCIS) of right breast   01/11/2020 Breast MRI   IMPRESSION: 1. Biopsy-proven DCIS involving the subareolar location and LOWER OUTER QUADRANT of the RIGHT breast with maximum craniocaudal measurement of 3.8 cm. The non-mass enhancement extends to just behind the nipple. 2. Indeterminate non-mass enhancement involving the LOWER OUTER QUADRANT of the LEFT breast at MIDDLE to POSTERIOR depth spanning 4.0 cm. 3. No pathologic lymphadenopathy.   01/14/2020 Genetic Testing   Negative  genetic testing.  KIT c.1879C>T (p.Pro627Ser) and PDGFRA c.1436G>A (p.Arg479Gln) VUS identified on the Multi-cancer panel.  The Multi-Gene Panel offered by [facility] includes sequencing and/or deletion duplication testing of the following 85 genes: AIP, ALK, APC, ATM, AXIN2,BAP1,  BARD1, BLM, BMPR1A, BRCA1, BRCA2, BRIP1, CASR, CDC73, CDH1, CDK4, CDKN1B, CDKN1C, CDKN2A (p14ARF), CDKN2A (p16INK4a), CEBPA, CHEK2, CTNNA1, DICER1, DIS3L2, EGFR (c.2369C>T, p.Thr790Met variant only), EPCAM (Deletion/duplication testing only), FH, FLCN, GATA2, GPC3, GREM1 (Promoter region deletion/duplication testing only), HOXB13 (c.251G>A, p.Gly84Glu), HRAS, KIT, MAX, MEN1, MET, MITF (c.952G>A, p.Glu318Lys variant only), MLH1, MSH2, MSH3, MSH6, MUTYH, NBN, NF1, NF2, NTHL1, PALB2, PDGFRA, PHOX2B, PMS2, POLD1, POLE, POT1, PRKAR1A, PTCH1, PTEN, RAD50, RAD51C, RAD51D, RB1, RECQL4, RET, RNF43, RUNX1, SDHAF2, SDHA (sequence changes only), SDHB, SDHC, SDHD, SMAD4, SMARCA4, SMARCB1, SMARCE1, STK11, SUFU, TERC, TERT, TMEM127, TP53, TSC1, TSC2, VHL, WRN and WT1.  The report date is January 14, 2020.   02/01/2020 Pathology Results   Diagnosis Breast, left, needle core biopsy, lower outer - FIBROADENOMATOID CHANGES, DUCT ECTASIA, AND FIBROCYSTIC CHANGES WITH APOCRINE METAPLASIA - NO MALIGNANCY IDENTIFIED Microscopic Comment These results were called to [facility] on February 04, 2020.   02/08/2020 - 02/2020 Anti-estrogen oral therapy   She was interested in chemoprevention with Tamoxifen and took this for 3 weeks before surgery but stopped due to poor toleration. Given b/l mastectomy for DCIS she does not need antiestrogen therapy.    04/29/2020 Surgery   BILATERAL NIPPLE SPARING MASTECTOMIES WITH RIGHT AXILLARY SENTINEL LYMPH NODE BIOPSY bu Dr Ebbie    IMMEDIATE BILATERAL BREAST RECONSTRUCTION WITH PLACEMENT OF TISSUE EXPANDER AND FLEX HD (ACELLULAR  HYDRATED DERMIS) by Dr Elisabeth    04/29/2020 Pathology Results    FINAL MICROSCOPIC DIAGNOSIS:   A. BREAST, LEFT, MASTECTOMY:  -  Benign breast tissue with duct ectasia, fibroadenomatoid and  fibrocystic changes  -  No malignancy identified   B. BREAST, LEFT NIPPLE, BIOPSY:  -  Benign breast tissue with duct ectasia  -  No malignancy identified   C. BREAST, RIGHT, MASTECTOMY:  -  Ductal carcinoma in situ, high grade, 0.8cm  -  Margins uninvolved by carcinoma (<0.1 cm; anterior margin)  -  Fibrocystic changes and duct ectasia with periductular chronic  inflammation  -  Previous biopsy site changes  -  See oncology table below   D. BREAST, RIGHT NIPPLE, BIOPSY:  -  Benign breast tissue  -  No malignancy identified   E. LYMPH NODE, RIGHT AXILLARY, SENTINEL EXCISION:  -  No carcinoma identified in one lymph node (0/1)   F. SKIN, LEFT MASTECTOMY FLAP, EXCISION:  -  Benign skin  -  No malignancy identified   G. SKIN, RIGHT MASTECTOMY FLAP, EXCISION:  -  Benign skin  -  No malignancy identified    04/29/2020 Cancer Staging   Staging form: Breast, AJCC 8th Edition - Pathologic stage from 04/29/2020: Stage 0 (pTis (DCIS), pN0, cM0, G3, ER+, PR+, HER2: Not Assessed) - Signed by Burton, Lacie K, NP on 01/28/2021 Histologic grading system: 3 grade system   01/28/2021 Survivorship   SCP delivered by Lacie Burton, NP      Discussed the use of AI scribe software for clinical note transcription with the patient, who gave verbal consent to proceed.  History of Present Illness   Natalie Cardenas, a breast cancer survivor who underwent a bilateral mastectomy, presents with persistent chest and nerve pain, which she believes is related to her surgery. She describes the pain as being mainly inside and underneath, more on the left side, and feels like it's on the chest wall. She also reports nerve pain on the right side where lymph nodes were removed. The nerve pain was dramatic in the first year after surgery, still present in the second year, but has improved going  into the third year. She rates her pain between 3 and 8, depending on the day.  In addition to the pain, Natalie Cardenas experiences shortness of breath and chest pain that is not going away. These symptoms started after her breast surgery and sometimes limit her physical activities. She also reports experiencing tingling and numbness in her hand since her recent biopsy.  Ilsa is borderline diabetic with an A1c of around 6.7. She has gained weight since her surgery, going from 130 to 155 pounds. She is not currently exercising regularly, but used to before her surgery. She does not drink or smoke.  She has been taking Klonopin  as needed for anxiety and has been feeling more anxious recently due to a recent biopsy that was highly suspicious of malignancy but turned out to be benign. She also mentions taking Ascarbose for her borderline diabetes as needed.         All other systems were reviewed with the patient and are negative.  MEDICAL HISTORY:  Past Medical History:  Diagnosis Date   Anxiety    Breast cancer (HCC)    Depression    Diabetes mellitus without complication (HCC)    Thyroid  nodule     SURGICAL HISTORY: Past Surgical History:  Procedure Laterality Date   BIOPSY  03/14/2023   Procedure: BIOPSY;  Surgeon: Shaaron Charleston  M, MD;  Location: [facility];  Service: Endoscopy;;   BREAST BIOPSY Right 12/28/2019   x2   BREAST BIOPSY Left 02/01/2020   BREAST RECONSTRUCTION WITH PLACEMENT OF TISSUE EXPANDER AND FLEX HD (ACELLULAR HYDRATED DERMIS) Bilateral 04/29/2020   Procedure: IMMEDIATE BILATERAL BREAST RECONSTRUCTION WITH PLACEMENT OF TISSUE EXPANDER AND FLEX HD (ACELLULAR HYDRATED DERMIS);  Surgeon: Elisabeth Craig RAMAN, MD;  Location: [facility];  Service: Plastics;  Laterality: Bilateral;   COLONOSCOPY WITH PROPOFOL  N/A 03/14/2023   Procedure: COLONOSCOPY WITH PROPOFOL ;  Surgeon: Shaaron Lamar HERO, MD;  Location: [facility];  Service: Endoscopy;  Laterality: N/A;  8:15  am, asa 2   ESOPHAGOGASTRODUODENOSCOPY (EGD) WITH PROPOFOL  N/A 03/14/2023   Procedure: ESOPHAGOGASTRODUODENOSCOPY (EGD) WITH PROPOFOL ;  Surgeon: Shaaron Lamar HERO, MD;  Location: [facility];  Service: Endoscopy;  Laterality: N/A;   MALONEY DILATION N/A 03/14/2023   Procedure: AGAPITO DILATION;  Surgeon: Shaaron Lamar HERO, MD;  Location: [facility];  Service: Endoscopy;  Laterality: N/A;   NIPPLE SPARING MASTECTOMY WITH SENTINEL LYMPH NODE BIOPSY Bilateral 04/29/2020   Procedure: BILATERAL NIPPLE SPARING MASTECTOMIES WITH RIGHT AXILLARY SENTINEL LYMPH NODE BIOPSY;  Surgeon: Ebbie Cough, MD;  Location: [facility];  Service: General;  Laterality: Bilateral;  BILATERAL PEC BLOCK, RNFA   POLYPECTOMY  03/14/2023   Procedure: POLYPECTOMY;  Surgeon: Shaaron Lamar HERO, MD;  Location: [facility];  Service: Endoscopy;;   REMOVAL OF BILATERAL TISSUE EXPANDERS WITH PLACEMENT OF BILATERAL BREAST IMPLANTS Bilateral 06/23/2020   Procedure: REMOVAL OF BILATERAL TISSUE EXPANDERS WITH PLACEMENT OF BILATERAL BREAST IMPLANTS;  Surgeon: Elisabeth Craig RAMAN, MD;  Location:  SURGERY CENTER;  Service: Plastics;  Laterality: Bilateral;  1.5 hours   TONSILLECTOMY      I have reviewed the social history and family history with the patient and they are unchanged from previous note.  ALLERGIES:  is allergic to gabapentin, lyrica  cr [pregabalin  er], and other.  MEDICATIONS:  Current Outpatient Medications  Medication Sig Dispense Refill   acetaminophen  (TYLENOL ) 500 MG tablet Take 500 mg by mouth every 6 (six) hours as needed.     clonazePAM  (KLONOPIN ) 0.5 MG tablet Take 1 tablet (0.5 mg total) by mouth 2 (two) times daily as needed. 30 tablet 0   DULoxetine  (CYMBALTA ) 20 MG capsule Take 1 capsule (20 mg total) by mouth daily. 30 capsule 1   fexofenadine (ALLEGRA) 180 MG tablet Take 180 mg by mouth daily.     ibuprofen  (ADVIL ) 200 MG tablet Take 800 mg by mouth daily. Current dose      montelukast  (SINGULAIR ) 10 MG tablet Take 1 tablet (10 mg total) by mouth at bedtime. 90 tablet 1   pantoprazole  (PROTONIX ) 40 MG tablet Take 1 tablet (40 mg total) by mouth 2 (two) times daily before a meal. Take 30-60 min before first meal of the day 180 tablet 1   Plecanatide  (TRULANCE ) 3 MG TABS Take 1 tablet (3 mg total) by mouth daily. 90 tablet 3   Prenatal Vit-Fe Fumarate-FA (PRENATAL VITAMIN PO) Take 1 tablet by mouth at bedtime.     sennosides-docusate sodium (SENOKOT-S) 8.6-50 MG tablet Take 3 tablets by mouth at bedtime.     No current facility-administered medications for this visit.    PHYSICAL EXAMINATION: ECOG PERFORMANCE STATUS: 1 - Symptomatic but completely ambulatory  Vitals:   05/10/23 1218  BP: 133/82  Pulse: 86  Resp: 17  Temp: 97.7 F (36.5 C)  SpO2: 98%   Wt Readings  from Last 3 Encounters:  05/10/23 155 lb 8 oz (70.5 kg)  04/29/23 155 lb 3.2 oz (70.4 kg)  03/14/23 155 lb (70.3 kg)     GENERAL:alert, no distress and comfortable SKIN: skin color, texture, turgor are normal, no rashes or significant lesions EYES: normal, Conjunctiva are pink and non-injected, sclera clear Musculoskeletal:no cyanosis of digits and no clubbing  NEURO: alert & oriented x 3 with fluent speech, no focal motor/sensory deficits       LABORATORY DATA:  I have reviewed the data as listed    Latest Ref Rng & Units 11/19/2022    9:22 AM 10/21/2022    1:57 PM 05/14/2022    4:15 PM  CBC  WBC 3.4 - 10.8 x10E3/uL 10.0  14.8  6.5   Hemoglobin 11.1 - 15.9 g/dL 87.4  86.8  88.4   Hematocrit 34.0 - 46.6 % 37.8  40.0  34.7   Platelets 150 - 450 x10E3/uL 329  283.0  221         Latest Ref Rng & Units 12/10/2022    2:03 PM 11/19/2022    9:22 AM 10/21/2022    1:57 PM  CMP  Glucose 70 - 99 mg/dL 85  856  858   BUN 6 - 24 mg/dL 14  20  20    Creatinine 0.57 - 1.00 mg/dL 9.21  9.24  9.16   Sodium 134 - 144 mmol/L 141  140  137   Potassium 3.5 - 5.2 mmol/L 4.2  4.5  4.0   Chloride  96 - 106 mmol/L 104  101  100   CO2 20 - 29 mmol/L 23  23  26    Calcium 8.7 - 10.2 mg/dL 9.7  9.6  89.4   Total Protein 6.0 - 8.5 g/dL 6.6     Total Bilirubin 0.0 - 1.2 mg/dL 0.3     Alkaline Phos 44 - 121 IU/L 114     AST 0 - 40 IU/L 19     ALT 0 - 32 IU/L 24         RADIOGRAPHIC STUDIES: I have personally reviewed the radiological images as listed and agreed with the findings in the report. No results found.    Orders Placed This Encounter  Procedures   US  LIMITED ULTRASOUND INCLUDING AXILLA RIGHT BREAST    Standing Status:   Future    Expected Date:   09/13/2023    Expiration Date:   05/09/2024    Scheduling Instructions:     [facility]    Reason for Exam (SYMPTOM  OR DIAGNOSIS REQUIRED):   f/u soft tissue seen on US  in 02/2023    Preferred imaging location?:   External   US  LIMITED ULTRASOUND INCLUDING AXILLA LEFT BREAST     Standing Status:   Future    Expected Date:   09/13/2023    Expiration Date:   05/09/2024    Scheduling Instructions:     [facility]    Reason for Exam (SYMPTOM  OR DIAGNOSIS REQUIRED):   f/u soft tissue seen on US  in 02/2023    Preferred imaging location?:   External   All questions were answered. The patient knows to call the clinic with any problems, questions or concerns. No barriers to learning was detected. The total time spent in the appointment was 40 minutes.     Onita Mattock, MD 05/10/2023

## 2023-05-13 ENCOUNTER — Ambulatory Visit: Payer: Managed Care, Other (non HMO) | Attending: General Surgery | Admitting: Rehabilitation

## 2023-05-16 ENCOUNTER — Encounter: Payer: Self-pay | Admitting: Hematology

## 2023-05-20 ENCOUNTER — Ambulatory Visit: Payer: Managed Care, Other (non HMO) | Admitting: Family Medicine

## 2023-05-20 DIAGNOSIS — J309 Allergic rhinitis, unspecified: Secondary | ICD-10-CM

## 2023-05-26 ENCOUNTER — Encounter: Payer: Self-pay | Admitting: Hematology

## 2023-05-31 ENCOUNTER — Inpatient Hospital Stay
Admission: RE | Admit: 2023-05-31 | Discharge: 2023-05-31 | Disposition: A | Discharge status: Home / Self Care | Payer: Self-pay | Source: Ambulatory Visit | Attending: Hematology | Admitting: Hematology

## 2023-05-31 ENCOUNTER — Other Ambulatory Visit (HOSPITAL_COMMUNITY): Payer: Self-pay | Admitting: Hematology

## 2023-05-31 ENCOUNTER — Inpatient Hospital Stay
Admission: RE | Admit: 2023-05-31 | Discharge: 2023-05-31 | Disposition: A | Discharge status: Home / Self Care | Payer: Self-pay | Source: Ambulatory Visit | Attending: Hematology

## 2023-05-31 ENCOUNTER — Other Ambulatory Visit: Payer: Self-pay

## 2023-05-31 DIAGNOSIS — T8543XA Leakage of breast prosthesis and implant, initial encounter: Secondary | ICD-10-CM

## 2023-05-31 DIAGNOSIS — D0511 Intraductal carcinoma in situ of right breast: Secondary | ICD-10-CM

## 2023-06-04 ENCOUNTER — Other Ambulatory Visit: Payer: Self-pay | Admitting: Hematology

## 2023-06-05 ENCOUNTER — Other Ambulatory Visit: Payer: Self-pay | Admitting: Internal Medicine

## 2023-06-20 ENCOUNTER — Telehealth: Payer: Self-pay | Admitting: "Endocrinology

## 2023-06-20 DIAGNOSIS — R7303 Prediabetes: Secondary | ICD-10-CM

## 2023-06-20 DIAGNOSIS — E782 Mixed hyperlipidemia: Secondary | ICD-10-CM

## 2023-06-20 NOTE — Telephone Encounter (Signed)
 Labs need to be updated.

## 2023-06-21 NOTE — Telephone Encounter (Signed)
 Labs updated and sent to [facility].

## 2023-06-24 ENCOUNTER — Ambulatory Visit: Payer: Managed Care, Other (non HMO) | Admitting: "Endocrinology

## 2023-06-29 ENCOUNTER — Other Ambulatory Visit: Payer: Self-pay | Admitting: Allergy & Immunology

## 2023-07-29 ENCOUNTER — Ambulatory Visit: Payer: Managed Care, Other (non HMO) | Admitting: "Endocrinology

## 2023-08-10 ENCOUNTER — Encounter: Payer: Self-pay | Admitting: Internal Medicine

## 2023-09-02 ENCOUNTER — Encounter: Payer: Self-pay | Admitting: Hematology

## 2023-09-12 ENCOUNTER — Other Ambulatory Visit: Payer: Self-pay | Admitting: Allergy & Immunology

## 2023-09-12 ENCOUNTER — Other Ambulatory Visit: Payer: Self-pay | Admitting: Gastroenterology

## 2023-09-17 LAB — COMPREHENSIVE METABOLIC PANEL WITH GFR
ALT: 23 IU/L (ref 0–32)
AST: 19 IU/L (ref 0–40)
Albumin: 4.4 g/dL (ref 3.9–4.9)
Alkaline Phosphatase: 116 IU/L (ref 44–121)
BUN/Creatinine Ratio: 16 (ref 9–23)
BUN: 12 mg/dL (ref 6–24)
Bilirubin Total: 0.5 mg/dL (ref 0.0–1.2)
CO2: 21 mmol/L (ref 20–29)
Calcium: 9.3 mg/dL (ref 8.7–10.2)
Chloride: 104 mmol/L (ref 96–106)
Creatinine, Ser: 0.76 mg/dL (ref 0.57–1.00)
Globulin, Total: 2.3 g/dL (ref 1.5–4.5)
Glucose: 96 mg/dL (ref 70–99)
Potassium: 4.4 mmol/L (ref 3.5–5.2)
Sodium: 141 mmol/L (ref 134–144)
Total Protein: 6.7 g/dL (ref 6.0–8.5)
eGFR: 96 mL/min/{1.73_m2} (ref >59)

## 2023-09-17 LAB — LIPID PANEL
Chol/HDL Ratio: 2.8 ratio (ref 0.0–4.4)
Cholesterol, Total: 208 mg/dL — ABNORMAL HIGH (ref 100–199)
HDL: 74 mg/dL (ref >39)
LDL Chol Calc (NIH): 119 mg/dL — ABNORMAL HIGH (ref 0–99)
Triglycerides: 85 mg/dL (ref 0–149)
VLDL Cholesterol Cal: 15 mg/dL (ref 5–40)

## 2023-09-30 ENCOUNTER — Ambulatory Visit: Admitting: "Endocrinology

## 2023-09-30 ENCOUNTER — Encounter: Payer: Self-pay | Admitting: "Endocrinology

## 2023-09-30 VITALS — BP 112/72 | HR 68 | Ht 65.0 in | Wt 157.0 lb

## 2023-09-30 DIAGNOSIS — E161 Other hypoglycemia: Secondary | ICD-10-CM

## 2023-09-30 DIAGNOSIS — R7303 Prediabetes: Secondary | ICD-10-CM | POA: Diagnosis not present

## 2023-09-30 DIAGNOSIS — E782 Mixed hyperlipidemia: Secondary | ICD-10-CM | POA: Diagnosis not present

## 2023-09-30 LAB — POCT GLYCOSYLATED HEMOGLOBIN (HGB A1C): HbA1c, POC (controlled diabetic range): 6.1 % (ref 0.0–7.0)

## 2023-09-30 MED ORDER — METFORMIN HCL ER 500 MG PO TB24: 500.0000 mg | ORAL_TABLET | Freq: Every day | ORAL | 1 refills | Status: DC

## 2023-09-30 NOTE — Progress Notes (Signed)
 09/30/2023, 12:00 PM  Endocrinology follow-up note   Subjective:    Patient ID: Casimir Cleaver, female    DOB: [birth year], PCP Roxene Cora, PA-C   Past Medical History:  Diagnosis Date   Anxiety    Breast cancer [facility])    Depression    Diabetes mellitus without complication (HCC)    Thyroid  nodule    Past Surgical History:  Procedure Laterality Date   BIOPSY  03/14/2023   Procedure: BIOPSY;  Surgeon: Suzette Espy, MD;  [facility];  Service: Endoscopy;;   BREAST BIOPSY Right 12/28/2019   x2   BREAST BIOPSY Left 02/01/2020   BREAST RECONSTRUCTION WITH PLACEMENT OF TISSUE EXPANDER AND FLEX HD (ACELLULAR HYDRATED DERMIS) Bilateral 04/29/2020   Procedure: IMMEDIATE BILATERAL BREAST RECONSTRUCTION WITH PLACEMENT OF TISSUE EXPANDER AND FLEX HD (ACELLULAR HYDRATED DERMIS);  Surgeon: Barb Bonito, MD;  Location: [facility];  Service: Plastics;  Laterality: Bilateral;   COLONOSCOPY WITH PROPOFOL  N/A 03/14/2023   Procedure: COLONOSCOPY WITH PROPOFOL ;  Surgeon: Suzette Espy, MD;  [facility];  Service: Endoscopy;  Laterality: N/A;  8:15 am, asa 2   ESOPHAGOGASTRODUODENOSCOPY (EGD) WITH PROPOFOL  N/A 03/14/2023   Procedure: ESOPHAGOGASTRODUODENOSCOPY (EGD) WITH PROPOFOL ;  Surgeon: Suzette Espy, MD;  [facility];  Service: Endoscopy;  Laterality: N/A;   MALONEY DILATION N/A 03/14/2023   Procedure: Londa Rival DILATION;  Surgeon: Suzette Espy, MD;  [facility];  Service: Endoscopy;  Laterality: N/A;   NIPPLE SPARING MASTECTOMY WITH SENTINEL LYMPH NODE BIOPSY Bilateral 04/29/2020   Procedure: BILATERAL NIPPLE SPARING MASTECTOMIES WITH RIGHT AXILLARY SENTINEL LYMPH NODE BIOPSY;  Surgeon: Enid Harry, MD;  Location: [facility];  Service: General;  Laterality: Bilateral;  BILATERAL PEC BLOCK, RNFA   POLYPECTOMY  03/14/2023   Procedure: POLYPECTOMY;   Surgeon: Suzette Espy, MD;  [facility];  Service: Endoscopy;;   REMOVAL OF BILATERAL TISSUE EXPANDERS WITH PLACEMENT OF BILATERAL BREAST IMPLANTS Bilateral 06/23/2020   Procedure: REMOVAL OF BILATERAL TISSUE EXPANDERS WITH PLACEMENT OF BILATERAL BREAST IMPLANTS;  Surgeon: Barb Bonito, MD;  Location: [facility];  Service: Plastics;  Laterality: Bilateral;  1.5 hours   TONSILLECTOMY     Social History   Socioeconomic History   Marital status: Married    Spouse name: Not on file   Number of children: 4   Years of education: Not on file   Highest education level: Not on file  Occupational History   Not on file  Tobacco Use   Smoking status: Never   Smokeless tobacco: Never  Vaping Use   Vaping status: Never Used  Substance and Sexual Activity   Alcohol use: Yes    Comment: social    Drug use: Never   Sexual activity: Yes  Other Topics Concern   Not on file  Social History Narrative   Lives at home with husband and two children, two out of the house.   Social Drivers of Corporate investment banker Strain: Not on file  Food Insecurity: Not on file  Transportation Needs: Not on file  Physical Activity: Not on file  Stress: Not on file  Social  Connections: Unknown (09/04/2021)   [facility]   Social Network    Social Network: Not on file   Family History  Problem Relation Age of Onset   Diabetes Mother    Heart failure Mother    Asthma Mother    Cancer Father 33       esophageal cancer   Diabetes Maternal Grandmother    Brain cancer Paternal Grandmother    Cancer Paternal Grandfather        esophageal cancer    Outpatient Encounter Medications as of 09/30/2023  Medication Sig   metFORMIN (GLUCOPHAGE-XR) 500 MG 24 hr tablet Take 1 tablet (500 mg total) by mouth daily after breakfast.   acetaminophen  (TYLENOL ) 500 MG tablet Take 500 mg by mouth every 6 (six) hours as needed.   clonazePAM  (KLONOPIN ) 0.5 MG  tablet Take 1 tablet (0.5 mg total) by mouth 2 (two) times daily as needed.   DULoxetine  (CYMBALTA ) 20 MG capsule TAKE 1 CAPSULE BY MOUTH EVERY DAY (Patient not taking: Reported on 09/30/2023)   fexofenadine (ALLEGRA) 180 MG tablet Take 180 mg by mouth daily.   ibuprofen  (ADVIL ) 200 MG tablet Take 800 mg by mouth daily. Current dose   montelukast  (SINGULAIR ) 10 MG tablet TAKE 1 TABLET BY MOUTH EVERYDAY AT BEDTIME   pantoprazole  (PROTONIX ) 40 MG tablet TAKE 1 TABLET BY MOUTH 2 TIMES DAILY BEFORE A MEAL. TAKE 30-60 MIN BEFORE FIRST MEAL OF THE DAY   Plecanatide  (TRULANCE ) 3 MG TABS Take 1 tablet (3 mg total) by mouth daily.   Prenatal Vit-Fe Fumarate-FA (PRENATAL VITAMIN PO) Take 1 tablet by mouth at bedtime.   sennosides-docusate sodium (SENOKOT-S) 8.6-50 MG tablet Take 3 tablets by mouth at bedtime.   No facility-administered encounter medications on file as of 09/30/2023.   ALLERGIES: Allergies  Allergen Reactions   Gabapentin Shortness Of Breath   Lyrica  Cr [Pregabalin  Er] Other (See Comments)    Unable to think clear.   Other Rash    Chlorahexadine wipe/soap    VACCINATION STATUS: Immunization History  Administered Date(s) Administered   Influenza,inj,quad, With Preservative 02/25/2012, 02/02/2019   Influenza-Unspecified 02/25/2012   PFIZER(Purple Top)SARS-COV-2 Vaccination 05/16/2019, 06/06/2019    HPI Cheila Wickstrom is 48 y.o. female who presents today with a medical history as above. she is being seen in follow-up after she was seen in consultation for prediabetes requested by Roxene Cora, PA-C.   -See notes from her previous visit.  Patient has history of prediabetes, now her labs show significant hyperlipidemia. she was never diagnosed with diabetes, however with A1c remains at 6.1% consistent with prediabetes.      -She was advised on dietary modification given her presentation consistent with reactive hypoglycemia during her last visit.   She is not having the symptoms  on a regular basis.  She was taken off of acarbose during her last visit.  She presents with steady weight.  She is not following particular diet.  She is not monitoring blood glucose regularly.  She has dyslipidemia, with LDL improving to 119 from 161,  not on treatment, and wishes to avoid medications.  Her other medical problems include breast cancer status post bilateral mastectomy in January 2022 and requested surgery in February 2022. She is not on any antidiabetic medications other than acarbose at this point.   Review of Systems  Constitutional: + Minimally fluctuating body weight,  no subjective hyperthermia, no subjective hypothermia Eyes: no blurry vision, no xerophthalmia   Objective:  09/30/2023   10:36 AM 05/10/2023   12:18 PM 04/29/2023    9:13 AM  Vitals with BMI  Height 5\' 5"   5\' 5"   Weight 157 lbs 155 lbs 8 oz 155 lbs 3 oz  BMI 26.13 25.88 25.83  Systolic 112 133 295  Diastolic 72 82 80  Pulse 68 86 88    BP 112/72   Pulse 68   Ht 5\' 5"  (1.651 m)   Wt 157 lb (71.2 kg)   BMI 26.13 kg/m   Wt Readings from Last 3 Encounters:  09/30/23 157 lb (71.2 kg)  05/10/23 155 lb 8 oz (70.5 kg)  04/29/23 155 lb 3.2 oz (70.4 kg)    Physical Exam  Constitutional:  Body mass index is 26.13 kg/m.,  not in acute distress, normal state of mind Eyes: PERRLA, EOMI, no exophthalmos Neck: Palpable goiter CMP ( most recent) CMP     Component Value Date/Time   NA 141 09/16/2023 1355   K 4.4 09/16/2023 1355   CL 104 09/16/2023 1355   CO2 21 09/16/2023 1355   GLUCOSE 96 09/16/2023 1355   GLUCOSE 141 (H) 10/21/2022 1357   BUN 12 09/16/2023 1355   CREATININE 0.76 09/16/2023 1355   CREATININE 0.73 01/07/2020 1619   CALCIUM 9.3 09/16/2023 1355   PROT 6.7 09/16/2023 1355   ALBUMIN 4.4 09/16/2023 1355   AST 19 09/16/2023 1355   AST 16 01/07/2020 1619   ALT 23 09/16/2023 1355   ALT 15 01/07/2020 1619   ALKPHOS 116 09/16/2023 1355   BILITOT 0.5 09/16/2023 1355    BILITOT 0.5 01/07/2020 1619   GFRNONAA >60 06/18/2020 1455   GFRNONAA >60 01/07/2020 1619   GFRAA >60 01/07/2020 1619   January 2024 labs include A1c of 5.9%, TSH 1.47, total T47.1, total T3 136.  Prior lipid panel showed total cholesterol 193, trig 58, HDL 72, LDL 110.  Recent Results (from the past 2160 hours)  Comprehensive metabolic panel     Status: None   Collection Time: 09/16/23  1:55 PM  Result Value Ref Range   Glucose 96 70 - 99 mg/dL   BUN 12 6 - 24 mg/dL   Creatinine, Ser 6.21 0.57 - 1.00 mg/dL   eGFR 96 >30 QM/VHQ/4.69   BUN/Creatinine Ratio 16 9 - 23   Sodium 141 134 - 144 mmol/L   Potassium 4.4 3.5 - 5.2 mmol/L   Chloride 104 96 - 106 mmol/L   CO2 21 20 - 29 mmol/L   Calcium 9.3 8.7 - 10.2 mg/dL   Total Protein 6.7 6.0 - 8.5 g/dL   Albumin 4.4 3.9 - 4.9 g/dL   Globulin, Total 2.3 1.5 - 4.5 g/dL   Bilirubin Total 0.5 0.0 - 1.2 mg/dL   Alkaline Phosphatase 116 44 - 121 IU/L   AST 19 0 - 40 IU/L   ALT 23 0 - 32 IU/L  Lipid Panel     Status: Abnormal   Collection Time: 09/16/23  1:55 PM  Result Value Ref Range   Cholesterol, Total 208 (H) 100 - 199 mg/dL   Triglycerides 85 0 - 149 mg/dL   HDL 74 >62 mg/dL   VLDL Cholesterol Cal 15 5 - 40 mg/dL   LDL Chol Calc (NIH) 952 (H) 0 - 99 mg/dL   Chol/HDL Ratio 2.8 0.0 - 4.4 ratio    Comment:  T. Chol/HDL Ratio                                             Men  Women                               1/2 Avg.Risk  3.4    3.3                                   Avg.Risk  5.0    4.4                                2X Avg.Risk  9.6    7.1                                3X Avg.Risk 23.4   11.0   HgB A1c     Status: None   Collection Time: 09/30/23 10:53 AM  Result Value Ref Range   Hemoglobin A1C     HbA1c POC (<> result, manual entry)     HbA1c, POC (prediabetic range)     HbA1c, POC (controlled diabetic range) 6.1 0.0 - 7.0 %      Assessment & Plan:   1. Prediabetes   2.  Reactive  hypoglycemia   3.  Hyperlipidemia  4.  Goiter  - I have reviewed her new and available endocrine records and clinically evaluated the patient. - Based on these reviews, she has prediabetes with A1c 6.1% today at point-of-care.  She also has dyslipidemia and history of reactive hypoglycemia.  -  Review of her medical history does not indicate chronic liver disease nor chronic kidney disease. To address these related comorbidities, she remains a candidate for lifestyle medicine.   - she acknowledges that there is a room for improvement in her food and drink choices. - Suggestion is made for her to avoid simple carbohydrates  from her diet including Cakes, Sweet Desserts, Ice Cream, Soda (diet and regular), Sweet Tea, Candies, Chips, Cookies, Store Bought Juices, Alcohol , Artificial Sweeteners,  Coffee Creamer, and "Sugar-free" Products, Lemonade. This will help patient to have more stable blood glucose profile and potentially avoid unintended weight gain.  The following Lifestyle Medicine recommendations according to American College of Lifestyle Medicine  [facility]) were discussed and and offered to patient and she  agrees to start the journey:  A. Whole Foods, Plant-Based Nutrition comprising of fruits and vegetables, plant-based proteins, whole-grain carbohydrates was discussed in detail with the patient.   A list for source of those nutrients were also provided to the patient.  Patient will use only water  or unsweetened tea for hydration. B.  The need to stay away from risky substances including alcohol, smoking; obtaining 7 to 9 hours of restorative sleep, at least 150 minutes of moderate intensity exercise weekly, the importance of healthy social connections,  and stress management techniques were discussed. C.  A full color page of  Calorie density of various food groups per pound showing examples of each food groups was provided to the patient.  -Based on her persistent A1c in the range of  prediabetes,  she is at risk for type 2 diabetes.  She may benefit from early intervention to reverse or delay her metabolic dysfunction.  I discussed and prescribed metformin 500 mg XR p.o. daily after breakfast - Regarding her LDL 119 improving to 132, the above described left amides nutrition will help her control lipid panel .  If her LDL remains above 100 mg per DL, she will be considered for low-dose statins next visit.    - Prior to her last visit, thyroid  function test were consistent with euthyroid presentation.  Thyroid  sonogram on February 04, 2023 was unremarkable.   - she is advised to maintain close follow up with Roxene Cora, PA-C for primary care needs.   I spent  26  minutes in the care of the patient today including review of labs from Thyroid  Function, CMP, and other relevant labs ; imaging/biopsy records (current and previous including abstractions from other facilities); face-to-face time discussing  her lab results and symptoms, medications doses, her options of short and long term treatment based on the latest standards of care / guidelines;   and documenting the encounter.  Ausha Sieh  participated in the discussions, expressed understanding, and voiced agreement with the above plans.  All questions were answered to her satisfaction. she is encouraged to contact clinic should she have any questions or concerns prior to her return visit.   Follow up plan: Return in about 6 months (around 03/31/2024) for Fasting Labs  in AM B4 8, A1c -NV.   Kalvin Orf, MD [facility] [address] [place], [place] [address] Phone: [phone]  Fax: [phone]     09/30/2023, 12:00 PM  This note was partially dictated with voice recognition software. Similar sounding words can be transcribed inadequately or may not  be corrected upon review.

## 2023-09-30 NOTE — Patient Instructions (Signed)

## 2023-12-12 ENCOUNTER — Other Ambulatory Visit: Payer: Self-pay | Admitting: Internal Medicine

## 2024-02-07 ENCOUNTER — Other Ambulatory Visit: Payer: Self-pay | Admitting: Gastroenterology

## 2024-03-18 ENCOUNTER — Other Ambulatory Visit: Payer: Self-pay | Admitting: Internal Medicine

## 2024-04-02 LAB — LAB REPORT - SCANNED
A1c: 6
EGFR: 98

## 2024-04-02 LAB — LIPID PANEL
Cholesterol: 210 — AB (ref 0–200)
HDL: 74 — AB (ref 35–70)
LDL Cholesterol: 123
Triglycerides: 75 (ref 40–160)

## 2024-04-02 LAB — HEMOGLOBIN A1C: Hemoglobin A1C: 6

## 2024-04-06 ENCOUNTER — Ambulatory Visit: Admitting: "Endocrinology

## 2024-04-27 ENCOUNTER — Ambulatory Visit: Admitting: "Endocrinology

## 2024-04-27 ENCOUNTER — Encounter: Payer: Self-pay | Admitting: "Endocrinology

## 2024-04-27 VITALS — BP 139/85 | HR 70 | Resp 18 | Ht 65.0 in | Wt 156.0 lb

## 2024-04-27 DIAGNOSIS — E049 Nontoxic goiter, unspecified: Secondary | ICD-10-CM

## 2024-04-27 DIAGNOSIS — E161 Other hypoglycemia: Secondary | ICD-10-CM | POA: Diagnosis not present

## 2024-04-27 DIAGNOSIS — R7303 Prediabetes: Secondary | ICD-10-CM

## 2024-04-27 DIAGNOSIS — E782 Mixed hyperlipidemia: Secondary | ICD-10-CM

## 2024-04-27 NOTE — Patient Instructions (Signed)

## 2024-04-27 NOTE — Progress Notes (Signed)
 "                                                      04/27/2024, 1:10 PM  Endocrinology follow-up note   Subjective:    Patient ID: Natalie Cardenas, female    DOB: 1975/07/11, PCP Leonce Lucie PARAS, PA-C   Past Medical History:  Diagnosis Date   Anxiety    Breast cancer [facility])    Depression    Diabetes mellitus without complication (HCC)    Thyroid  nodule    Past Surgical History:  Procedure Laterality Date   BIOPSY  03/14/2023   Procedure: BIOPSY;  Surgeon: Shaaron Lamar HERO, MD;  [facility];  Service: Endoscopy;;   BREAST BIOPSY Right 12/28/2019   x2   BREAST BIOPSY Left 02/01/2020   BREAST RECONSTRUCTION WITH PLACEMENT OF TISSUE EXPANDER AND FLEX HD (ACELLULAR HYDRATED DERMIS) Bilateral 04/29/2020   Procedure: IMMEDIATE BILATERAL BREAST RECONSTRUCTION WITH PLACEMENT OF TISSUE EXPANDER AND FLEX HD (ACELLULAR HYDRATED DERMIS);  Surgeon: Elisabeth Craig RAMAN, MD;  Location: [facility];  Service: Plastics;  Laterality: Bilateral;   COLONOSCOPY WITH PROPOFOL  N/A 03/14/2023   Procedure: COLONOSCOPY WITH PROPOFOL ;  Surgeon: Shaaron Lamar HERO, MD;  [facility];  Service: Endoscopy;  Laterality: N/A;  8:15 am, asa 2   ESOPHAGOGASTRODUODENOSCOPY (EGD) WITH PROPOFOL  N/A 03/14/2023   Procedure: ESOPHAGOGASTRODUODENOSCOPY (EGD) WITH PROPOFOL ;  Surgeon: Shaaron Lamar HERO, MD;  [facility];  Service: Endoscopy;  Laterality: N/A;   MALONEY DILATION N/A 03/14/2023   Procedure: AGAPITO DILATION;  Surgeon: Shaaron Lamar HERO, MD;  [facility];  Service: Endoscopy;  Laterality: N/A;   NIPPLE SPARING MASTECTOMY WITH SENTINEL LYMPH NODE BIOPSY Bilateral 04/29/2020   Procedure: BILATERAL NIPPLE SPARING MASTECTOMIES WITH RIGHT AXILLARY SENTINEL LYMPH NODE BIOPSY;  Surgeon: Ebbie Cough, MD;  Location: [facility];  Service: General;  Laterality: Bilateral;  BILATERAL PEC BLOCK, RNFA   POLYPECTOMY  03/14/2023   Procedure: POLYPECTOMY;   Surgeon: Shaaron Lamar HERO, MD;  [facility];  Service: Endoscopy;;   REMOVAL OF BILATERAL TISSUE EXPANDERS WITH PLACEMENT OF BILATERAL BREAST IMPLANTS Bilateral 06/23/2020   Procedure: REMOVAL OF BILATERAL TISSUE EXPANDERS WITH PLACEMENT OF BILATERAL BREAST IMPLANTS;  Surgeon: Elisabeth Craig RAMAN, MD;  Location: [facility];  Service: Plastics;  Laterality: Bilateral;  1.5 hours   TONSILLECTOMY     Social History   Socioeconomic History   Marital status: Married    Spouse name: Not on file   Number of children: 4   Years of education: Not on file   Highest education level: Not on file  Occupational History   Not on file  Tobacco Use   Smoking status: Never   Smokeless tobacco: Never  Vaping Use   Vaping status: Never Used  Substance and Sexual Activity   Alcohol use: Yes    Comment: social    Drug use: Never   Sexual activity: Yes  Other Topics Concern   Not on file  Social History Narrative   Lives at home with husband and two children, two out of the house.   Social Drivers of Health   Tobacco Use: Low Risk (04/27/2024)   Patient History    Smoking Tobacco Use: Never    Smokeless Tobacco Use: Never  Passive Exposure: Not on file  Financial Resource Strain: Not on file  Food Insecurity: Not on file  Transportation Needs: Not on file  Physical Activity: Not on file  Stress: Not on file  Social Connections: Unknown (09/04/2021)   Received [facility]   Social Network    Social Network: Not on file  Depression (PHQ2-9): Not on file  Alcohol Screen: Not on file  Housing: Not on file  Utilities: Not on file  Health Literacy: Not on file   Family History  Problem Relation Age of Onset   Diabetes Mother    Heart failure Mother    Asthma Mother    Cancer Father 15       esophageal cancer   Diabetes Maternal Grandmother    Brain cancer Paternal Grandmother    Cancer Paternal Grandfather        esophageal cancer    Outpatient Encounter  Medications as of 04/27/2024  Medication Sig   acetaminophen  (TYLENOL ) 500 MG tablet Take 500 mg by mouth every 6 (six) hours as needed.   clonazePAM  (KLONOPIN ) 0.5 MG tablet Take 1 tablet (0.5 mg total) by mouth 2 (two) times daily as needed.   estradiol (ESTRACE) 1 MG tablet Take 1 mg by mouth daily.   fexofenadine (ALLEGRA) 180 MG tablet Take 180 mg by mouth daily.   ibuprofen  (ADVIL ) 200 MG tablet Take 800 mg by mouth daily. Current dose   Magnesium 100 MG CAPS Take 1 capsule by mouth as needed.   montelukast  (SINGULAIR ) 10 MG tablet TAKE 1 TABLET BY MOUTH EVERYDAY AT BEDTIME   NONFORMULARY OR COMPOUNDED ITEM Inject into the skin once a week. Semaglutide 5 mg/ Vitamin B-6 4mg    pantoprazole  (PROTONIX ) 40 MG tablet Take 1 tablet (40 mg total) by mouth 2 (two) times daily before a meal.   Plecanatide  (TRULANCE ) 3 MG TABS TAKE 1 TABLET BY MOUTH DAILY   Prenatal Vit-Fe Fumarate-FA (PRENATAL VITAMIN PO) Take 1 tablet by mouth at bedtime.   progesterone (PROMETRIUM) 100 MG capsule Take 100 mg by mouth daily.   sennosides-docusate sodium (SENOKOT-S) 8.6-50 MG tablet Take 3 tablets by mouth at bedtime.   [DISCONTINUED] metFORMIN  (GLUCOPHAGE -XR) 500 MG 24 hr tablet Take 1 tablet (500 mg total) by mouth daily after breakfast.   FLUoxetine (PROZAC) 10 MG tablet Take 10 mg by mouth daily. (Patient not taking: Reported on 04/27/2024)   [DISCONTINUED] DULoxetine  (CYMBALTA ) 20 MG capsule TAKE 1 CAPSULE BY MOUTH EVERY DAY (Patient not taking: Reported on 04/27/2024)   No facility-administered encounter medications on file as of 04/27/2024.   ALLERGIES: Allergies  Allergen Reactions   Gabapentin Shortness Of Breath   Lyrica  Cr [Pregabalin  Er] Other (See Comments)    Unable to think clear.   Other Rash    Chlorahexadine wipe/soap    VACCINATION STATUS: Immunization History  Administered Date(s) Administered   Influenza,inj,quad, With Preservative 02/25/2012, 02/02/2019   Influenza-Unspecified 02/25/2012    PFIZER(Purple Top)SARS-COV-2 Vaccination 05/16/2019, 06/06/2019    Hyperlipidemia   Natalie Cardenas is 49 y.o. female who presents today with a medical history as above. she is being seen in follow-up after she was seen in consultation for prediabetes requested by Leonce Lucie PARAS, PA-C.   -See notes from her previous visit.  Patient has history of prediabetes, A1c remains at 6%.she also has uncontrolled hyperlipidemia.   She did not tolerate the metformin  prescribed for her during last visit.  -She was advised on dietary modification given her presentation consistent with reactive  hypoglycemia during her last visit.  She has not engaged with lifestyle medicine nutrition optimally.   She presents with steady weight. She is not monitoring blood glucose regularly.  She has dyslipidemia, with LDL worsening to 123.  She is not on treatment , and wishes to avoid medications.  Her other medical problems include breast cancer status post bilateral mastectomy in January 2022 and requested surgery in February 2022.   Review of Systems  Constitutional: + Steady weight,   no subjective hyperthermia, no subjective hypothermia Eyes: no blurry vision, no xerophthalmia   Objective:       04/27/2024   10:41 AM 09/30/2023   10:36 AM 05/10/2023   12:18 PM  Vitals with BMI  Height 5' 5 5' 5   Weight 156 lbs 157 lbs 155 lbs 8 oz  BMI 25.96 26.13 25.88  Systolic 139 112 866  Diastolic 85 72 82  Pulse 70 68 86    BP 139/85   Pulse 70   Resp 18   Ht 5' 5 (1.651 m)   Wt 156 lb (70.8 kg)   SpO2 98%   BMI 25.96 kg/m   Wt Readings from Last 3 Encounters:  04/27/24 156 lb (70.8 kg)  09/30/23 157 lb (71.2 kg)  05/10/23 155 lb 8 oz (70.5 kg)    Physical Exam  Constitutional:  Body mass index is 25.96 kg/m.,  not in acute distress, normal state of mind Eyes: PERRLA, EOMI, no exophthalmos Neck: Palpable goiter CMP ( most recent) CMP     Component Value Date/Time   NA 141 09/16/2023 1355    K 4.4 09/16/2023 1355   CL 104 09/16/2023 1355   CO2 21 09/16/2023 1355   GLUCOSE 96 09/16/2023 1355   GLUCOSE 141 (H) 10/21/2022 1357   BUN 12 09/16/2023 1355   CREATININE 0.76 09/16/2023 1355   CREATININE 0.73 01/07/2020 1619   CALCIUM 9.3 09/16/2023 1355   PROT 6.7 09/16/2023 1355   ALBUMIN 4.4 09/16/2023 1355   AST 19 09/16/2023 1355   AST 16 01/07/2020 1619   ALT 23 09/16/2023 1355   ALT 15 01/07/2020 1619   ALKPHOS 116 09/16/2023 1355   BILITOT 0.5 09/16/2023 1355   BILITOT 0.5 01/07/2020 1619   GFRNONAA >60 06/18/2020 1455   GFRNONAA >60 01/07/2020 1619   GFRAA >60 01/07/2020 1619    Recent Results (from the past 2160 hours)  Lipid panel     Status: Abnormal   Collection Time: 04/02/24 12:00 AM  Result Value Ref Range   Triglycerides 75 40 - 160   Cholesterol 210 (A) 0 - 200   HDL 74 (A) 35 - 70   LDL Cholesterol 123   Hemoglobin A1c     Status: None   Collection Time: 04/02/24 12:00 AM  Result Value Ref Range   Hemoglobin A1C 6   Lab report - scanned     Status: None   Collection Time: 04/02/24  2:52 PM  Result Value Ref Range   A1c 6.0     Comment: Abstracted by HIM   EGFR 98.0     Comment: Abstracted by HIM     Assessment & Plan:   1. Prediabetes   2.  Reactive hypoglycemia   3.  Hyperlipidemia  4.  Goiter  - I have reviewed her new and available endocrine records and clinically evaluated the patient.  No further hypoglycemia reported. - Based on these reviews, she has prediabetes with A1c 6% today at point-of-care.  She did  not tolerate metformin  prescribed for her last visit.  She is also continue to have uncontrolled dyslipidemia, she would like to avoid medication therapy at this time.     To address her interrelated conditions of prediabetes, hyperlipidemia, and her desire to avoid pharmaceuticals , she remains a candidate for lifestyle medicine.   - she acknowledges that there is a room for improvement in her food and drink choices. - Suggestion  is made for her to avoid simple carbohydrates  from her diet including Cakes, Sweet Desserts, Ice Cream, Soda (diet and regular), Sweet Tea, Candies, Chips, Cookies, Store Bought Juices, Alcohol , Artificial Sweeteners,  Coffee Creamer, and Sugar-free Products, Lemonade. This will help patient to have more stable blood glucose profile and potentially avoid unintended weight gain.  The following Lifestyle Medicine recommendations according to American College of Lifestyle Medicine  [facility]) were discussed and and offered to patient and she  agrees to start the journey:  A. Whole Foods, Plant-Based Nutrition comprising of fruits and vegetables, plant-based proteins, whole-grain carbohydrates was discussed in detail with the patient.   A list for source of those nutrients were also provided to the patient.  Patient will use only water  or unsweetened tea for hydration. B.  The need to stay away from risky substances including alcohol, smoking; obtaining 7 to 9 hours of restorative sleep, at least 150 minutes of moderate intensity exercise weekly, the importance of healthy social connections,  and stress management techniques were discussed. C.  A full color page of  Calorie density of various food groups per pound showing examples of each food groups was provided to the patient.  - Regarding her LDL 123 , wishes to avoid statins for now.  If her LDL remains above 100 mg per DL, she will be considered for low-dose statins next visit.    - Prior to her last visit, thyroid  function test were consistent with euthyroid presentation.  Thyroid  sonogram on February 04, 2023 was unremarkable.   - she is advised to maintain close follow up with Leonce Lucie PARAS, PA-C for primary care needs.   I spent  25  minutes in the care of the patient today including review of labs from Thyroid  Function, CMP, and other relevant labs ; imaging/biopsy records (current and previous including abstractions from other  facilities); face-to-face time discussing  her lab results and symptoms, medications doses, her options of short and long term treatment based on the latest standards of care / guidelines;   and documenting the encounter.  Arhianna Ebey  participated in the discussions, expressed understanding, and voiced agreement with the above plans.  All questions were answered to her satisfaction. she is encouraged to contact clinic should she have any questions or concerns prior to her return visit. Dear Patient: Feel free to review your progress notes.  If you are reviewing this progress note and have questions about the meaning of /or medical terms being used, please make a note and address it at your next follow-up appointment.  Medical notes are meant to be a communication tool between medical professionals and require medical terms to be used for efficiency and insurance approval.   Follow up plan: Return in about 6 months (around 10/25/2024), or if symptoms worsen or fail to improve, for Fasting Labs  in AM B4 8, A1c -[place].   Ranny Earl, MD [facility] [address] [place], [place] [address] Phone: [phone]  Fax: [phone]     04/27/2024,  1:10 PM  This note was partially dictated with voice recognition software. Similar sounding words can be transcribed inadequately or may not  be corrected upon review.  "

## 2024-11-09 ENCOUNTER — Ambulatory Visit: Admitting: "Endocrinology
